# Patient Record
Sex: Male | Born: 1955
Health system: Southern US, Community
[De-identification: ages and names within clinical notes are randomized; demographics above are authoritative.]

## PROBLEM LIST (undated history)

## (undated) DIAGNOSIS — I10 Essential (primary) hypertension: Secondary | ICD-10-CM

## (undated) DIAGNOSIS — J189 Pneumonia, unspecified organism: Secondary | ICD-10-CM

## (undated) DIAGNOSIS — R7303 Prediabetes: Secondary | ICD-10-CM

## (undated) DIAGNOSIS — E291 Testicular hypofunction: Secondary | ICD-10-CM

## (undated) DIAGNOSIS — K219 Gastro-esophageal reflux disease without esophagitis: Secondary | ICD-10-CM

## (undated) DIAGNOSIS — B009 Herpesviral infection, unspecified: Secondary | ICD-10-CM

## (undated) DIAGNOSIS — E669 Obesity, unspecified: Secondary | ICD-10-CM

## (undated) DIAGNOSIS — M109 Gout, unspecified: Secondary | ICD-10-CM

## (undated) DIAGNOSIS — E785 Hyperlipidemia, unspecified: Secondary | ICD-10-CM

## (undated) HISTORY — DX: Hyperlipidemia, unspecified: E78.5

## (undated) HISTORY — DX: Testicular hypofunction: E29.1

## (undated) HISTORY — DX: Prediabetes: R73.03

## (undated) HISTORY — DX: Obesity, unspecified: E66.9

## (undated) HISTORY — PX: HERNIA REPAIR: SHX51

## (undated) HISTORY — DX: Gout, unspecified: M10.9

## (undated) HISTORY — DX: Essential (primary) hypertension: I10

## (undated) HISTORY — DX: Herpesviral infection, unspecified: B00.9

---

## 1997-07-27 ENCOUNTER — Ambulatory Visit (HOSPITAL_COMMUNITY): Admission: RE | Admit: 1997-07-27 | Discharge: 1997-07-27 | Payer: Self-pay | Admitting: General Surgery

## 1998-11-27 ENCOUNTER — Emergency Department (HOSPITAL_COMMUNITY): Admission: EM | Admit: 1998-11-27 | Discharge: 1998-11-27 | Payer: Self-pay | Admitting: Emergency Medicine

## 1999-08-08 ENCOUNTER — Ambulatory Visit: Admission: RE | Admit: 1999-08-08 | Discharge: 1999-08-08 | Payer: Self-pay | Admitting: Internal Medicine

## 2003-05-07 ENCOUNTER — Ambulatory Visit (HOSPITAL_COMMUNITY): Admission: RE | Admit: 2003-05-07 | Discharge: 2003-05-07 | Payer: Self-pay | Admitting: Internal Medicine

## 2004-11-19 IMAGING — CR DG CHEST 2V
2 series · 2 of 2 positions shown · non-contrast
Comparison: none

CLINICAL DATA: Hypertension with cough.
 PA AND LATERAL CHEST:
 Heart size is mildly enlarged with left ventricular prominence.  The mediastinal configuration is otherwise intact.  The lung fields demonstrate some mild prominence of pulmonary vascularity.  No signs of congestive failure, interstitial edema, or focal infiltrate are noted.  No pleural effusions are seen.
 Bony structures demonstrate mild degenerative changes of the thoracic spine with anterior vertebral body height loss at several lower thoracic levels.
 IMPRESSION
 Mild cardiomegaly with left ventricular prominence and mild prominence of pulmonary vascularity.

[view not recorded (1 of 2)]
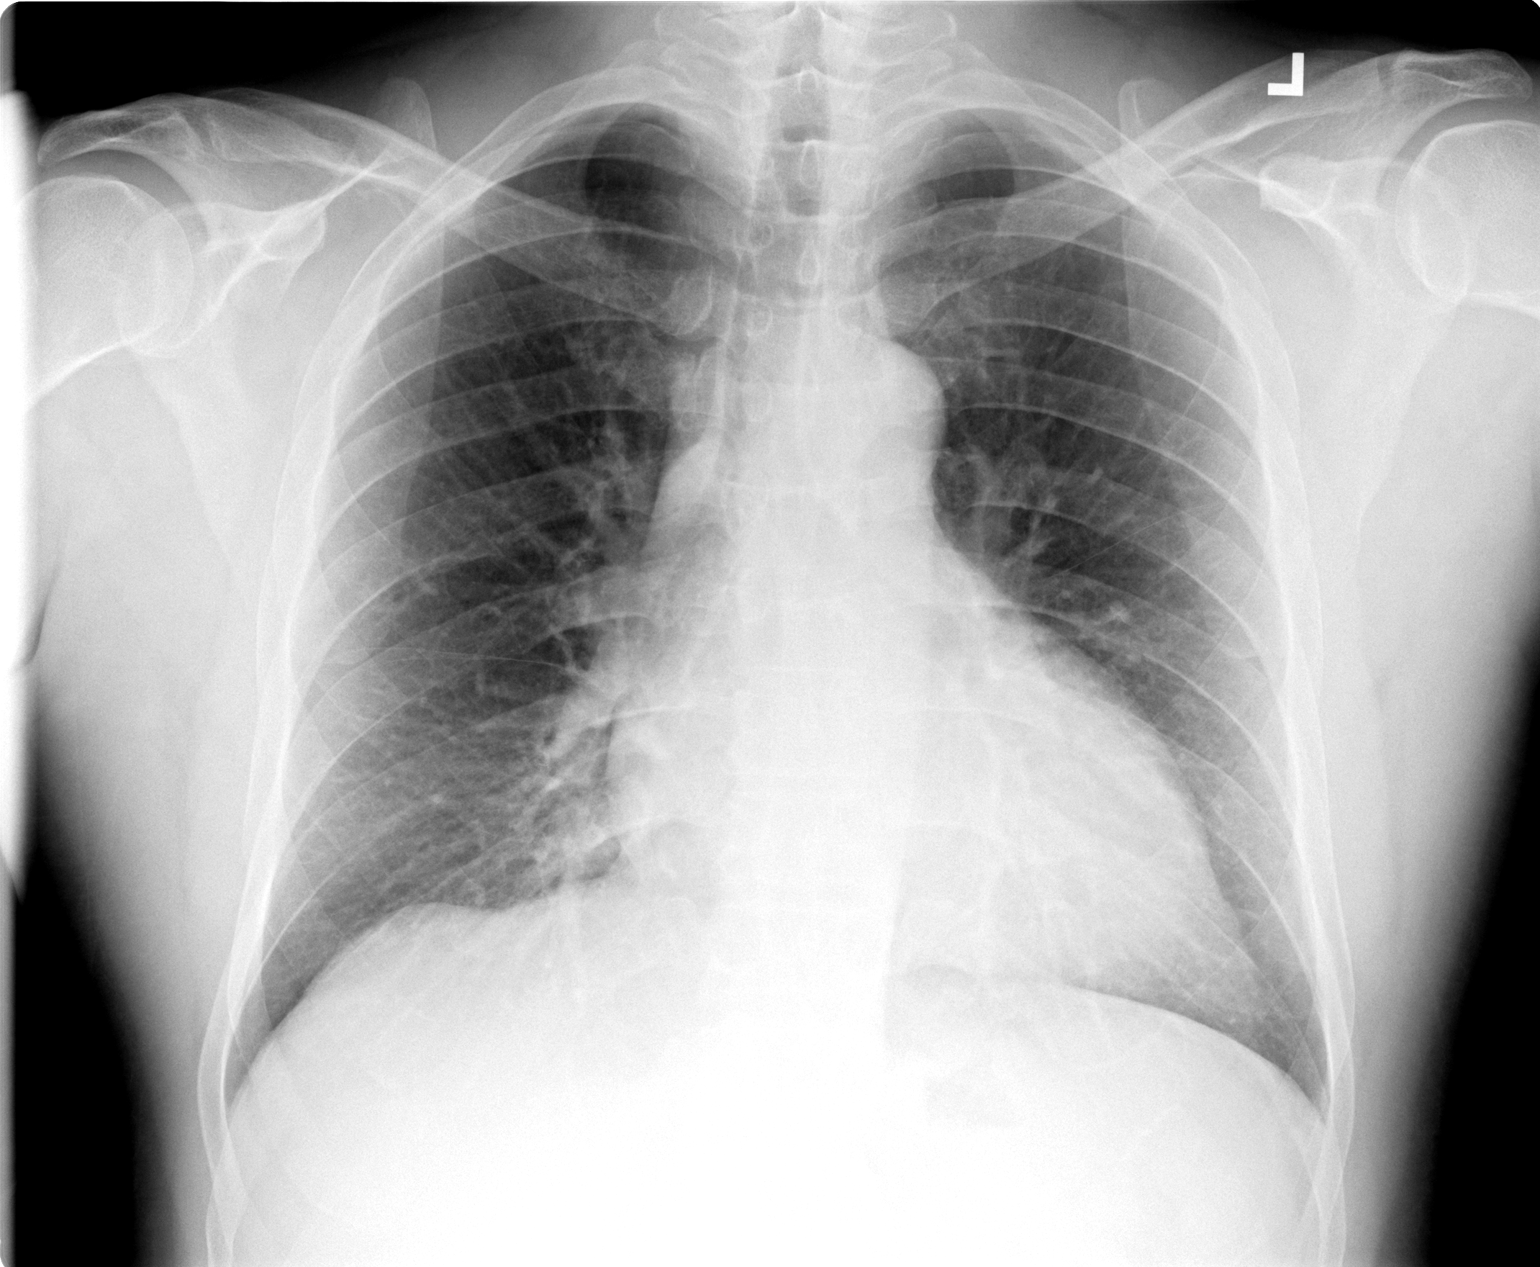

[view not recorded (2 of 2)]
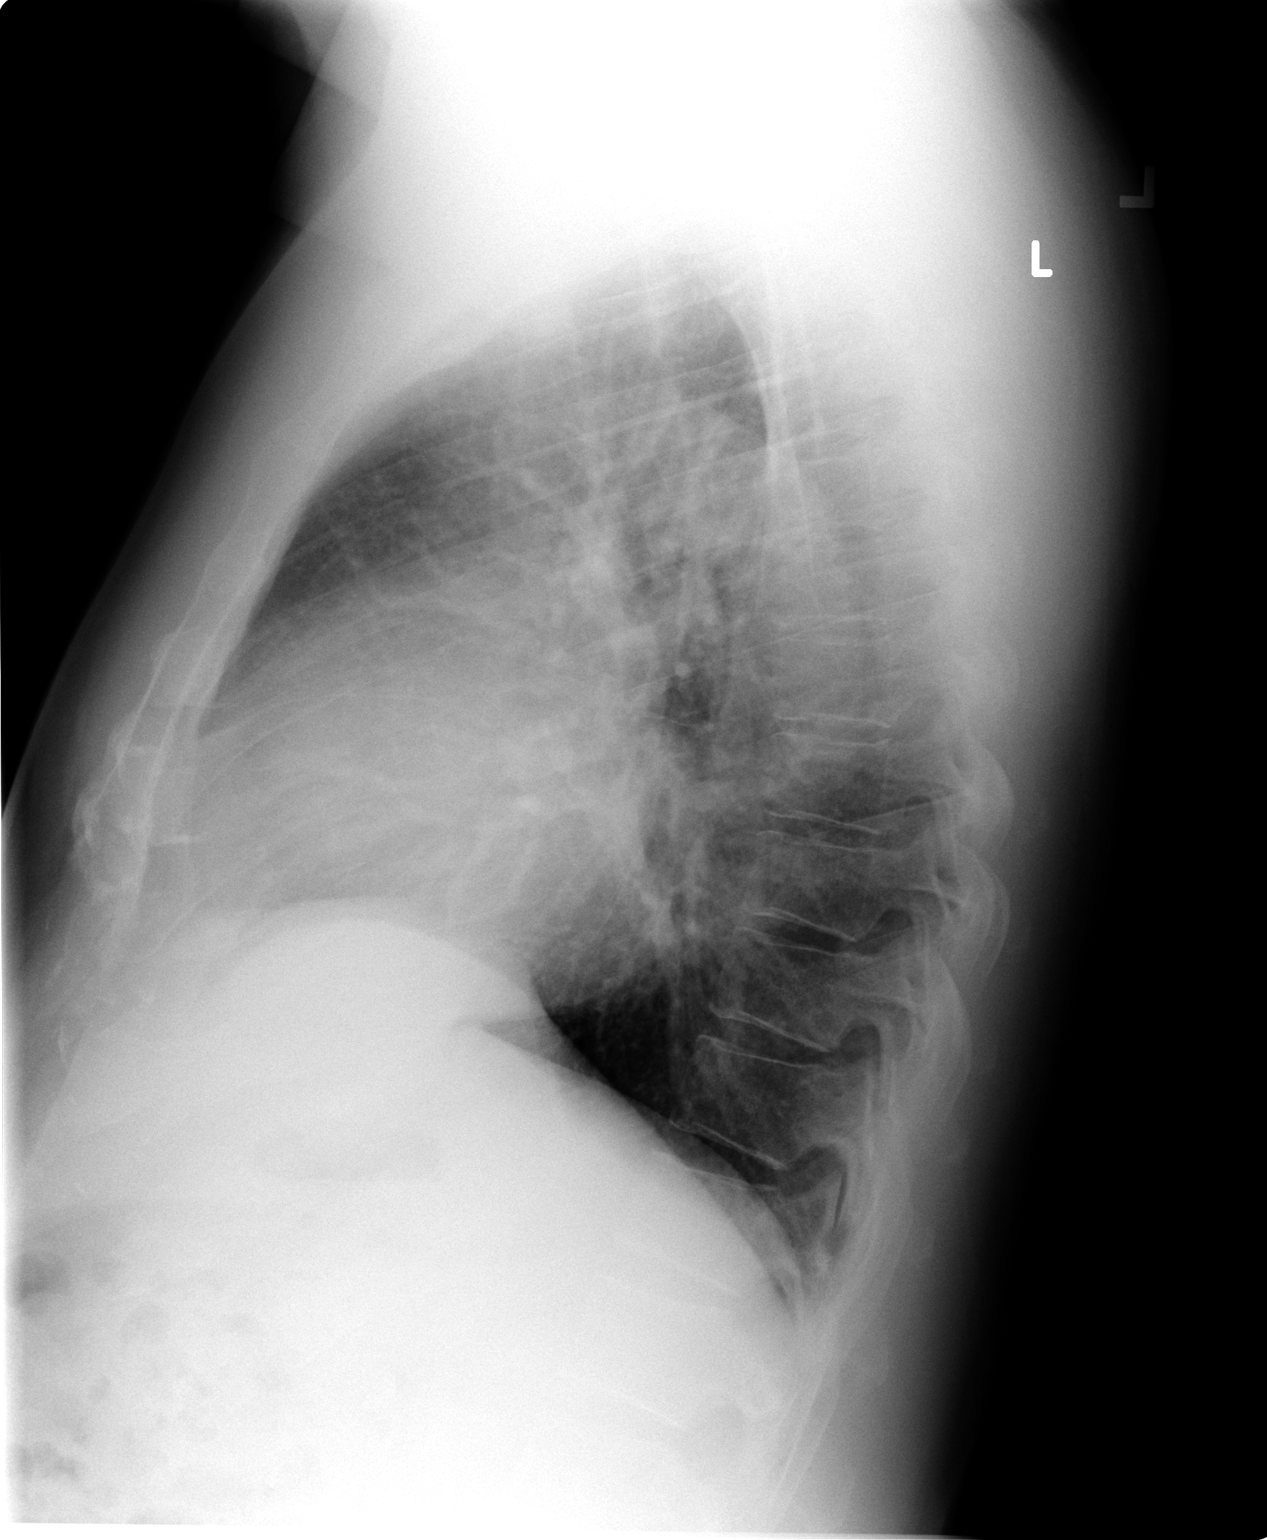

[2 of 2 positions shown; findings below may reference images not displayed]

## 2006-05-19 ENCOUNTER — Encounter (INDEPENDENT_AMBULATORY_CARE_PROVIDER_SITE_OTHER): Payer: Self-pay | Admitting: *Deleted

## 2006-05-19 ENCOUNTER — Ambulatory Visit (HOSPITAL_BASED_OUTPATIENT_CLINIC_OR_DEPARTMENT_OTHER): Admission: RE | Admit: 2006-05-19 | Discharge: 2006-05-19 | Payer: Self-pay | Admitting: *Deleted

## 2006-08-05 ENCOUNTER — Ambulatory Visit: Payer: Self-pay | Admitting: Internal Medicine

## 2006-08-19 ENCOUNTER — Ambulatory Visit: Payer: Self-pay | Admitting: Internal Medicine

## 2007-03-07 ENCOUNTER — Ambulatory Visit: Payer: Self-pay | Admitting: Internal Medicine

## 2009-05-15 ENCOUNTER — Encounter: Payer: Self-pay | Admitting: Internal Medicine

## 2009-05-16 ENCOUNTER — Telehealth: Payer: Self-pay | Admitting: Internal Medicine

## 2009-05-16 DIAGNOSIS — E349 Endocrine disorder, unspecified: Secondary | ICD-10-CM

## 2009-05-16 DIAGNOSIS — K219 Gastro-esophageal reflux disease without esophagitis: Secondary | ICD-10-CM | POA: Insufficient documentation

## 2009-05-16 DIAGNOSIS — I10 Essential (primary) hypertension: Secondary | ICD-10-CM | POA: Insufficient documentation

## 2009-05-16 DIAGNOSIS — E782 Mixed hyperlipidemia: Secondary | ICD-10-CM | POA: Insufficient documentation

## 2009-05-17 ENCOUNTER — Ambulatory Visit: Payer: Self-pay | Admitting: Internal Medicine

## 2009-08-08 ENCOUNTER — Ambulatory Visit: Payer: Self-pay | Admitting: Internal Medicine

## 2009-09-06 ENCOUNTER — Encounter: Payer: Self-pay | Admitting: Internal Medicine

## 2010-02-23 HISTORY — PX: BACK SURGERY: SHX140

## 2010-03-14 ENCOUNTER — Encounter: Payer: Self-pay | Admitting: Internal Medicine

## 2010-03-25 NOTE — Assessment & Plan Note (Signed)
Summary: RECTAL BLEEDING/POSITIVE HEMOCCULTS          DEBORAH    History of Present Illness Visit Type: consult  Primary GI MD: Lina Sar MD Primary Provider: Marinus Maw, MD Requesting Provider: Marinus Maw, MD  Chief Complaint: Heme positive stools and rectal bleeding  History of Present Illness:   This is a  55 year old, African American male with intermittent painless rectal bleeding. He was evaluated in June 2008 with a colonoscopy which showed Grade 1  internal hemorrhoids. He responded to Anusol [facility] suppositories and was well for over a year. He has recently developed recurrent rectal bleeding. He works at the [employer]. There is no family history of colon cancer. He denies constipation.   GI Review of Systems      Denies abdominal pain, acid reflux, belching, bloating, chest pain, dysphagia with liquids, dysphagia with solids, heartburn, loss of appetite, nausea, vomiting, vomiting blood, weight loss, and  weight gain.      Reports heme positive stool and  rectal bleeding.     Denies anal fissure, black tarry stools, change in bowel habit, constipation, diarrhea, diverticulosis, fecal incontinence, hemorrhoids, irritable bowel syndrome, jaundice, light color stool, liver problems, and  rectal pain.    Current Medications (verified): 1)  Atenolol 100 Mg Tabs (Atenolol) .... Take 1 Tablet By Mouth Once A Day 2)  Hydrochlorothiazide 25 Mg Tabs (Hydrochlorothiazide) .... Take 1 Tablet By Mouth Once A Day 3)  Pravachol 40 Mg Tabs (Pravastatin Sodium) .... Take 1 Tablet By Mouth Once A Day 4)  Multivitamins  Tabs (Multiple Vitamin) .... Take 1 Tablet By Mouth Once A Day 5)  Zantac 150 Maximum Strength 150 Mg Tabs (Ranitidine Hcl) .... One Tablet By Mouth Once Daily  Allergies (verified): 1)  ! Ace Inhibitors  Past History:  Past Medical History: Reviewed history from 05/16/2009 and no changes required. Current Problems:  RECTAL BLEEDING  (ICD-569.3) ESOPHAGITIS, HX OF (ICD-V12.79) HYPOGONADISM (ICD-257.2) GERD (ICD-530.81) INTERNAL HEMORRHOIDS (ICD-455.0) HYPERTENSION (ICD-401.9) HYPERLIPIDEMIA (ICD-272.4)    Past Surgical History: Reviewed history from 05/16/2009 and no changes required. Circumcision -1990 I & D of rectal abscess-10/1998 Excision of Large Epidermoid Cyst from Upper Back  Family History: Reviewed history from 05/16/2009 and no changes required. Family History of Cirrhosis:Father died w/alcoholic cirrhosis Family History of Diabetes: Mother No FH of Colon Cancer:  Social History: Occupation: [employer]  Divorced  Childern Patient is a former smoker.  Alcohol Use - no Daily Caffeine Use: Soda daily  Illicit Drug Use - no Smoking Status:  quit  Review of Systems  The patient denies allergy/sinus, anemia, anxiety-new, arthritis/joint pain, back pain, blood in urine, breast changes/lumps, change in vision, confusion, cough, coughing up blood, depression-new, fainting, fatigue, fever, headaches-new, hearing problems, heart murmur, heart rhythm changes, itching, muscle pains/cramps, night sweats, nosebleeds, shortness of breath, skin rash, sleeping problems, sore throat, swelling of feet/legs, swollen lymph glands, thirst - excessive, urination - excessive, urination changes/pain, urine leakage, vision changes, and voice change.         .  Vital Signs:  Patient profile:   55 year old male Height:      69 inches Weight:      199 pounds BMI:     29.49 BSA:     2.06 Pulse rate:   72 / minute Pulse rhythm:   regular BP sitting:   132 / 80  (left arm) Cuff size:   regular  Vitals Entered By: Ok Anis CMA (May 17, 2009 1:42 PM)  Physical Exam  General:  Well developed, well nourished, no acute distress. Eyes:  PERRLA, no icterus. Mouth:  No deformity or lesions, dentition normal. Lungs:  Clear throughout to auscultation. Heart:  Regular rate and rhythm; no murmurs, rubs,  or bruits. Abdomen:   Soft, nontender and nondistended. No masses, hepatosplenomegaly or hernias noted. Normal bowel sounds. Rectal:  perianal area normal, rectal tone was normal. There were first-grade hemorrhoids internally; one of them bleeding actively. There was no thrombosis. There was no evidence of proctitis. There was no stool in the rectum. Extremities:  No clubbing, cyanosis, edema or deformities noted. Skin:  Intact without significant lesions or rashes. Psych:  Alert and cooperative. Normal mood and affect.   Impression & Recommendations:  Problem # 1:  RECTAL BLEEDING (ICD-569.3) Patient has first grade internal hemorrhoids, one of them is  bleeding. We discussed conservative treatment with Anusol-HC suppositories which patient prefers. I have suggested that in the future he may need hemorrhoidal banding. He would prefer the conservative route. I suggested stool softeners and high fiber diet. He will be due for a repeat colonoscopy in 2018 unless his condition changes.  Problem # 2:  ESOPHAGITIS, HX OF (ICD-V12.79) Patient currently has no symptoms.  Patient Instructions: 1)  Please use your anusol suppositories as directed. We have sent refills to your pharmacy to pick up when you need them. 2)  Copy sent to : Lucky Cowboy, MD 3)  The medication list was reviewed and reconciled.  All changed / newly prescribed medications were explained.  A complete medication list was provided to the patient / caregiver. Prescriptions: ANUSOL-HC 25 MG SUPP (HYDROCORTISONE ACETATE) Insert into rectum at bedtime as directed  #30 x 4   Entered by:   Hortense Ramal CMA (AAMA)   Authorized by:   Hart Carwin MD   Signed by:   Hortense Ramal CMA (AAMA) on 05/17/2009   Method used:   Electronically to        [facility] [phone]* (retail)       [address]       [place], [place]  [address]       Ph: [phone]       Fax: [phone]   RxID:   [id number]

## 2010-03-25 NOTE — Assessment & Plan Note (Signed)
Summary: blood in stool--ch.    History of Present Illness Visit Type: Follow-up Visit Primary GI MD: Lina Sar MD Primary Provider: Marinus Maw, MD Requesting Provider: na Chief Complaint: Blood in stool with BMs History of Present Illness:   This is a  55 year old, African American male with intermittent painless rectal bleeding. He was evaluated in June 2008 with a colonoscopy which showed Grade 1 internal hemorrhoids. He responded to Anusol [facility] suppositories and was well for over a year. He recently developed recurrent rectal bleeding at which time he was evaluated by our office in March 2011. He works at the [employer]. There is no family history of colon cancer. He denies constipation. At the time of his last visit with [place], we suggested that in the future, he may need hemorrhoidal banding. He preferred the conservative route and has been trying Anusol suppositories as well as stool softeners and a high fiber diet. Unfortunately, his bleeding has continued.   GI Review of Systems      Denies abdominal pain, acid reflux, belching, bloating, chest pain, dysphagia with liquids, dysphagia with solids, heartburn, loss of appetite, nausea, vomiting, vomiting blood, weight loss, and  weight gain.      Reports rectal bleeding.     Denies anal fissure, black tarry stools, change in bowel habit, constipation, diarrhea, diverticulosis, fecal incontinence, heme positive stool, hemorrhoids, irritable bowel syndrome, jaundice, light color stool, liver problems, and  rectal pain.    Current Medications (verified): 1)  Atenolol 100 Mg Tabs (Atenolol) .... Take 1 Tablet By Mouth Once A Day 2)  Hydrochlorothiazide 25 Mg Tabs (Hydrochlorothiazide) .... Take 1 Tablet By Mouth Once A Day 3)  Pravachol 40 Mg Tabs (Pravastatin Sodium) .... Take 1 Tablet By Mouth Once A Day 4)  Multivitamins  Tabs (Multiple Vitamin) .... Take 1 Tablet By Mouth Once A Day 5)  Zantac 150 Maximum  Strength 150 Mg Tabs (Ranitidine Hcl) .... One Tablet By Mouth Once Daily 6)  Anusol-Hc 25 Mg Supp (Hydrocortisone Acetate) .... Insert Into Rectum At Bedtime As Directed  Allergies (verified): 1)  ! Ace Inhibitors  Past History:  Past Medical History: Reviewed history from 05/16/2009 and no changes required. Current Problems:  RECTAL BLEEDING (ICD-569.3) ESOPHAGITIS, HX OF (ICD-V12.79) HYPOGONADISM (ICD-257.2) GERD (ICD-530.81) INTERNAL HEMORRHOIDS (ICD-455.0) HYPERTENSION (ICD-401.9) HYPERLIPIDEMIA (ICD-272.4)    Past Surgical History: Reviewed history from 05/16/2009 and no changes required. Circumcision -1990 I & D of rectal abscess-10/1998 Excision of Large Epidermoid Cyst from Upper Back  Family History: Reviewed history from 05/16/2009 and no changes required. Family History of Cirrhosis:Father died w/alcoholic cirrhosis Family History of Diabetes: Mother No FH of Colon Cancer:  Social History: Reviewed history from 05/17/2009 and no changes required. Occupation: [employer]  Divorced  Childern Patient is a former smoker.  Alcohol Use - no Daily Caffeine Use: Soda daily  Illicit Drug Use - no  Review of Systems  The patient denies allergy/sinus, anemia, anxiety-new, arthritis/joint pain, back pain, blood in urine, breast changes/lumps, change in vision, confusion, cough, coughing up blood, depression-new, fainting, fatigue, fever, headaches-new, hearing problems, heart murmur, heart rhythm changes, itching, menstrual pain, muscle pains/cramps, night sweats, nosebleeds, pregnancy symptoms, shortness of breath, skin rash, sleeping problems, sore throat, swelling of feet/legs, swollen lymph glands, thirst - excessive , urination - excessive , urination changes/pain, urine leakage, vision changes, and voice change.         Pertinent positive and negative review of systems were noted in the above HPI.  All other ROS was otherwise negative.   Vital Signs:  Patient profile:    55 year old male Height:      69 inches Weight:      209 pounds BMI:     30.98 BSA:     2.11 Pulse rate:   68 / minute Pulse rhythm:   regular BP sitting:   136 / 82  (left arm) Cuff size:   regular  Vitals Entered By: Ok Anis CMA (August 08, 2009 9:20 AM)  Physical Exam  General:  Well developed, well nourished, no acute distress. Neck:  Supple; no masses or thyromegaly. Lungs:  Clear throughout to auscultation. Heart:  Regular rate and rhythm; no murmurs, rubs,  or bruits. Abdomen:  Soft, nontender and nondistended. No masses, hepatosplenomegaly or hernias noted. Normal bowel sounds. Rectal:  normal perianal area. Normal rectal tone. First grade internal hemorrhoids bleeding. Stool is Hemoccult positive. There is no prolapse of the hemorrhoids. There is no thrombosis or at least of the internal hemorrhoids which are bleeding. Extremities:  No clubbing, cyanosis, edema or deformities noted. Skin:  Intact without significant lesions or rashes. Psych:  Alert and cooperative. Normal mood and affect.   Impression & Recommendations:  Problem # 1:  RECTAL BLEEDING (ICD-569.3)  Patient has first grade internal hemorrhoids with recurrent bleeding. First episode occured  3 years ago. Although he responds to conservative treatment, the bleeding almost always comes back. Patient is ready to have a definitive treatment. We discussed hemorrhoidal banding and he would prefer surgical evaluation. He is up-to-date on his colonoscopy which was done in 2008.  Orders: [facility] ([facility])  Problem # 2:  GERD (ICD-530.81) controlled on Zantac 150 mg once a day.  Patient Instructions: 1)  Anusol-HC suppositories one q.h.s. 2)  Stool softeners and high-fiber diet. 3)  Appointment at [facility] surgery for hemorrhoidal banding. 4)  Recall colonoscopy 2018. 5)  Copy sent to : Dr Rosalita Levan, Dr Judie Petit.Tsuei 6)  The medication list was reviewed and reconciled.  All changed / newly  prescribed medications were explained.  A complete medication list was provided to the patient / caregiver. Prescriptions: ANUSOL-HC 25 MG SUPP (HYDROCORTISONE ACETATE) Insert into rectum at bedtime as directed  #30 x 1   Entered by:   Lamona Curl CMA (AAMA)   Authorized by:   Hart Carwin MD   Signed by:   Lamona Curl CMA (AAMA) on 08/08/2009   Method used:   Electronically to        [facility] [phone]* (retail)       [address]       [place], [place]  [address]       Ph: [phone]       Fax: [phone]   RxID:   [id number]

## 2010-03-25 NOTE — Letter (Signed)
Summary: [facility]  [address] [place], [place] [address]   Phone: [phone]  Fax: [phone]            September 06, 2009 MRN: [id number]    Caleb Johns [address] [place], [place]  [address]    Dear [name],   Unfortunately, we have been unable to reach you by phone concerning the visit that you cancelled with [facility] for a consult regarding possible banding of your hemorrhoids. We ask that you please call our office at [phone] to update [place] on your intentions regarding this appointment. We would like to know if you are doing better and if you plan on rescheduling the consultation appointment at any time. We will update our records according to your wishes. Thank you for your attention to this matter. We look forward to continuing in your health care needs.    Thank you,    Hedwig Morton. Juanda Chance, MD [facility]

## 2010-03-25 NOTE — Procedures (Signed)
Summary: COLON   Colonoscopy  Procedure date:  08/19/2006  Findings:      Location:  [facility].    Procedures Next Due Date:    Colonoscopy: 08/2016 Patient Name: Caleb Johns, Caleb Johns MRN:  Procedure Procedures: Colonoscopy CPT: 16109.  Personnel: Endoscopist: Maycie Luera L. Juanda Chance, MD.  Referred By: Marinus Maw, MD.  Exam Location: Exam performed in Outpatient Clinic. Outpatient  Patient Consent: Procedure, Alternatives, Risks and Benefits discussed, consent obtained, from patient. Consent was obtained by the RN.  Indications  Average Risk Screening Routine.  History  Current Medications: Patient is not currently taking Coumadin.  Pre-Exam Physical: Performed Aug 19, 2006. Entire physical exam was normal.  Comments: Pt. history reviewed/updated, physical exam performed prior to initiation of sedation?yes Exam Exam: Extent of exam reached: Cecum, extent intended: Cecum.  The cecum was identified by appendiceal orifice and IC valve. Duration of exam: time in  5:25 min, out 7:18 min minutes. Colon retroflexion performed. Images taken. ASA Classification: I. Tolerance: good.  Monitoring: Pulse and BP monitoring, Oximetry used. Supplemental O2 given.  Colon Prep Used Miralax for colon prep. Prep results: good.  Sedation Meds: Patient assessed and found to be appropriate for moderate (conscious) sedation. Fentanyl 75 mcg. given IV. Versed 10 mg. given IV.  Findings - NORMAL EXAM: Cecum.  - NORMAL EXAM: Rectum.   Assessment Normal examination.  Comments: no polyps Events  Unplanned Interventions: No intervention was required.  Unplanned Events: There were no complications. Plans Patient Education: Patient given standard instructions for: Patient instructed to get routine colonoscopy every 10 years.  Disposition: After procedure patient sent to recovery. After recovery patient sent home.   This report was created from the original endoscopy  report, which was reviewed and signed by the above listed endoscopist.

## 2010-03-25 NOTE — Progress Notes (Signed)
Summary: TRIAGE-URGENT APPT.   Phone Note From Other Clinic Call back at [phone]   Caller: Aram Beecham from Dr Oneta Rack office Call For: Juanda Chance Reason for Call: Schedule Patient Appt Summary of Call: Dr Edman Circle Would like this patient seen before first available appt 5-3 do to rectal bleeding and positive for blood after rectal exam. Initial call taken by: Tawni Levy,  May 16, 2009 9:36 AM  Follow-up for Phone Call        Pt. will see Dr.Jessika Rothery on 05-17-09 at 2pm. Aram Beecham will advise pt. of appt/med.list/co-pay/cx.policy and she will fax records to [place] at [phone]. Follow-up by: Laureen Ochs LPN,  May 16, 2009 9:47 AM

## 2010-04-22 NOTE — Consult Note (Signed)
Summary: [facility]   Imported By: Sherian Rein 04/14/2010 09:51:48  _____________________________________________________________________  External Attachment:    Type:   Image     Comment:   External Document

## 2010-07-08 NOTE — Assessment & Plan Note (Signed)
[facility]                         GASTROENTEROLOGY OFFICE NOTE   MANI, CELESTIN                         MRN:          [id number]  DATE:03/07/2007                            DOB:          26-Aug-1955    Mr. Keetch is a 55 year old gentleman who has had intermittent pain with  rectal with bleeding.  We saw him in June of 2008 at Dr. Kathryne Sharper  request for routine colonoscopy for neoplastic screening.  He had a  completely normal exam.  The colonoscopic, which was retroflexed in the  rectum at that time, did not show any evidence or any presence of  hemorrhoids.  He since then has had occasional bright red blood per  rectum, and most recently almost on a daily basis.  He seems to strain a  lot due to constipation.  He blames constipation on vitamin D as well as  hydrochlorothiazide and atenolol which he takes for high blood pressure.  There is no family history of colon cancer.   MEDICATIONS:  1. Hydrochlorothiazide 25 mg p.o. daily.  2. Atenolol 100 mg p.o. daily.  3. Pravastatin 20 mg p.o. daily.  4. Multiple vitamins.  5. Vitamin D.   PAST HISTORY:  Significant for high blood pressure, hyperlipidemia,  arthritic complaints, back surgery last year, sinus problems.   FAMILY HISTORY:  Positive for diabetes in mother.   SOCIAL HISTORY:  Married with 5 children.  The patient stopped smoking  and drinking, 1989.   REVIEW OF SYSTEMS:  Wears glasses, allergies, arthritic complaints.   PHYSICAL EXAMINATION:  Blood pressure 140/80, pulse 62 and weight 207  pounds.  The patient was alert and oriented in no distress.  LUNGS:  Clear to auscultation.  COR:  Normal S1 and normal S2.  ABDOMEN:  Soft, nontender.  RECTAL EXAM/ENDOSCOPIC EXAM:  Reveals normal perianal area.  Rectal tone  was normal.  There were large 1st grade hemorrhoids, at least 2 of them  were somewhat hyperemic and swollen.  Stool was Hemoccult negative.  One  of the hemorrhoids was  actually leaking a small amount of bright red  blood.   IMPRESSION:  A 55 year old African-American male with 1st grade internal  hemorrhoids, now symptomatic with bleeding.  The hemorrhoids were not  noted on last colonoscopy in June 2008.  The exam was essentially  normal.   PLAN:  Treat the hemorrhoids initially conservatively with  suppositories, dietary modifications, and stool softeners, and if he  does not respond over a period of time, I have suggested that these  hemorrhoids could be banded.  He will use Anusol 2 suppositories  nightly, 3 weeks were given, Colace 100 mg a day or 3 times per week for  constipation.  Drink a lot of liquids, stay on a high-fiber diet, no  need for repeat colonoscopy at this time.     Hedwig Morton. Juanda Chance, MD  Electronically Signed    DMB/MedQ  DD: 03/07/2007  DT: 03/07/2007  Job #: [id number]   cc:   Lucky Cowboy, M.D.

## 2010-07-11 NOTE — Op Note (Signed)
NAME:  Caleb Johns, Caleb Johns NO.:  [id number]   MEDICAL RECORD NO.:  [id number]          PATIENT TYPE:  AMB   LOCATION:  [facility]                         FACILITY:  [facility]   PHYSICIAN:  Alfonse Ras, MD   DATE OF BIRTH:  04/28/1955   DATE OF PROCEDURE:  05/19/2006  DATE OF DISCHARGE:                               OPERATIVE REPORT   PREOPERATIVE DIAGNOSIS:  Right upper back mass.   POSTOPERATIVE DIAGNOSIS:  Infected epidermoid cyst.   OPERATION/PROCEDURE:  Excision of epidermoid cyst and drainage of  abscess and open packing.   SURGEON:  Alfonse Ras, M.D.   ANESTHESIA:  General.   DESCRIPTION OF PROCEDURE:  The patient was taken to the operating room,  placed in the supine position.  Laryngeal mask was placed and general  anesthesia was induced.  The patient was placed in the left lateral  decubitus position.  Right upper back was prepped and draped in the  normal sterile fashion.  Using a vertically oriented incision, I  dissected down through the skin onto a encapsulated mass which had  significant amount of pus within it.  This was drained.  The entire  capsule was excised in its entirety.  Adequate hemostasis was ensured  with the use of Bovie electrocautery and Surgicel.  It was packed with  saline-soaked gauze.  Sterile dressing was applied.  The patient  tolerated procedure well and went to with PACU in good condition.      Alfonse Ras, MD  Electronically Signed     KRE/MEDQ  D:  05/19/2006  T:  05/19/2006  Job:  [id number]

## 2010-10-04 ENCOUNTER — Inpatient Hospital Stay (INDEPENDENT_AMBULATORY_CARE_PROVIDER_SITE_OTHER)
Admission: RE | Admit: 2010-10-04 | Discharge: 2010-10-04 | Disposition: A | Discharge status: Home / Self Care | Payer: BC Managed Care – PPO | Source: Ambulatory Visit | Attending: Family Medicine | Admitting: Family Medicine

## 2010-10-04 DIAGNOSIS — S335XXA Sprain of ligaments of lumbar spine, initial encounter: Secondary | ICD-10-CM

## 2010-10-04 DIAGNOSIS — M436 Torticollis: Secondary | ICD-10-CM

## 2010-10-04 DIAGNOSIS — M75100 Unspecified rotator cuff tear or rupture of unspecified shoulder, not specified as traumatic: Secondary | ICD-10-CM

## 2012-08-23 ENCOUNTER — Ambulatory Visit (HOSPITAL_COMMUNITY): Payer: BC Managed Care – PPO | Attending: Cardiology | Admitting: Radiology

## 2012-08-23 VITALS — BP 124/76 | HR 52 | Ht 69.0 in | Wt 203.0 lb

## 2012-08-23 DIAGNOSIS — R0609 Other forms of dyspnea: Secondary | ICD-10-CM | POA: Insufficient documentation

## 2012-08-23 DIAGNOSIS — R0789 Other chest pain: Secondary | ICD-10-CM | POA: Insufficient documentation

## 2012-08-23 DIAGNOSIS — I10 Essential (primary) hypertension: Secondary | ICD-10-CM | POA: Insufficient documentation

## 2012-08-23 DIAGNOSIS — R0989 Other specified symptoms and signs involving the circulatory and respiratory systems: Secondary | ICD-10-CM | POA: Insufficient documentation

## 2012-08-23 DIAGNOSIS — R079 Chest pain, unspecified: Secondary | ICD-10-CM

## 2012-08-23 DIAGNOSIS — Z87891 Personal history of nicotine dependence: Secondary | ICD-10-CM | POA: Insufficient documentation

## 2012-08-23 DIAGNOSIS — E119 Type 2 diabetes mellitus without complications: Secondary | ICD-10-CM | POA: Insufficient documentation

## 2012-08-23 DIAGNOSIS — E785 Hyperlipidemia, unspecified: Secondary | ICD-10-CM | POA: Insufficient documentation

## 2012-08-23 DIAGNOSIS — R0602 Shortness of breath: Secondary | ICD-10-CM

## 2012-08-23 MED ORDER — TECHNETIUM TC 99M SESTAMIBI GENERIC - CARDIOLITE
11.0000 | Freq: Once | INTRAVENOUS | Status: AC | PRN
  Administered 2012-08-23: 11 via INTRAVENOUS

## 2012-08-23 MED ORDER — TECHNETIUM TC 99M SESTAMIBI GENERIC - CARDIOLITE
33.0000 | Freq: Once | INTRAVENOUS | Status: AC | PRN
  Administered 2012-08-23: 33 via INTRAVENOUS

## 2012-08-23 NOTE — Progress Notes (Signed)
[facility] SITE 3 NUCLEAR MED [address] [place], [place] [address] [phone]    Cardiology Nuclear Med Study  Caleb Johns is a 57 y.o. male     MRN : [id number]     DOB: [birth year]  Procedure Date: 08/23/2012  Nuclear Med Background Indication for Stress Test:  Evaluation for Ischemia History:  No prior known history of CAD Cardiac Risk Factors: History of Smoking, Hypertension, Lipids and NIDDM  Symptoms: Chest Pain/Pressure without exertion (last occurrence 2 weeks ago),  DOE   Nuclear Pre-Procedure Caffeine/Decaff Intake:  None> 12 hrs NPO After: 9:00pm   Lungs:  clear O2 Sat: 98% on room air. IV 0.9% NS with Angio Cath:  20g  IV Site: R Antecubital x 1, tolerated well IV Started by:  Irean Hong, RN  Chest Size (in):  42 Cup Size: n/a  Height: 5\' 9"  (1.753 m)  Weight:  203 lb (92.08 kg)  BMI:  Body mass index is 29.96 kg/(m^2). Tech Comments:  Held Atenolol x 36 hrs    Nuclear Med Study 1 or 2 day study: 1 day  Stress Test Type:  Stress  Reading MD: Olga Millers, MD  Order Authorizing Provider:  Lucky Cowboy, MD  Resting Radionuclide: Technetium 22m Sestamibi  Resting Radionuclide Dose: 11.0 mCi   Stress Radionuclide:  Technetium 68m Sestamibi  Stress Radionuclide Dose: 33.0 mCi           Stress Protocol Rest HR: 52 Stress HR: 146  Rest BP: 124/76 Stress BP: 204/91  Exercise Time (min): 10:30 METS: 12.5   Predicted Max HR: 164 bpm % Max HR: 89.02 bpm Rate Pressure Product: 21308   Dose of Adenosine (mg):  n/a Dose of Lexiscan: n/a mg  Dose of Atropine (mg): n/a Dose of Dobutamine: n/a mcg/kg/min (at max HR)  Stress Test Technologist: Irean Hong, RN  Nuclear Technologist:  Domenic Polite, CNMT     Rest Procedure:  Myocardial perfusion imaging was performed at rest 45 minutes following the intravenous administration of Technetium 63m Sestamibi. Rest ECG: Sinus bradycardia, cannot R/O septal MI.  Stress Procedure:  The patient  exercised on the treadmill utilizing the Bruce Protocol for 10:30 minutes, RPE= 17. The patient stopped due to Fatigue and denied any chest pain. There was a hypertensive response to exercise. Technetium 70m Sestamibi was injected at peak exercise and myocardial perfusion imaging was performed after a brief delay. Stress ECG: Insignificant upsloping ST segment depression.  QPS Raw Data Images:  Acquisition technically good; normal left ventricular size. Stress Images:  Normal homogeneous uptake in all areas of the myocardium. Rest Images:  Normal homogeneous uptake in all areas of the myocardium. Subtraction (SDS):  No evidence of ischemia. Transient Ischemic Dilatation (Normal <1.22):  n/a Lung/Heart Ratio (Normal <0.45):  0.39  Quantitative Gated Spect Images QGS EDV:  118 ml QGS ESV:  47 ml  Impression Exercise Capacity:  Good exercise capacity. BP Response:  Hypertensive blood pressure response. Clinical Symptoms:  There is dyspnea. ECG Impression:  Insignificant upsloping ST segment depression. Comparison with Prior Nuclear Study: No previous nuclear study performed  Overall Impression:  Normal stress nuclear study.  LV Ejection Fraction: 61%.  LV Wall Motion:  NL LV Function; NL Wall Motion  Olga Millers

## 2012-09-14 ENCOUNTER — Encounter: Payer: BC Managed Care – PPO | Admitting: Physician Assistant

## 2013-01-05 ENCOUNTER — Telehealth: Payer: Self-pay | Admitting: Internal Medicine

## 2013-01-05 MED ORDER — RANITIDINE HCL 300 MG PO TABS: 300.0000 mg | ORAL_TABLET | Freq: Every day | ORAL | Status: DC

## 2013-01-05 MED ORDER — HYDROCHLOROTHIAZIDE 25 MG PO TABS: 25.0000 mg | ORAL_TABLET | Freq: Every day | ORAL | Status: DC

## 2013-01-05 MED ORDER — ALLOPURINOL 300 MG PO TABS: 300.0000 mg | ORAL_TABLET | Freq: Every day | ORAL | Status: DC

## 2013-01-05 NOTE — Telephone Encounter (Signed)
Pt needs said pharmacy needs renewal on gout med, indegestion med, and blood pressure hctz at [facility] [place]

## 2013-01-26 ENCOUNTER — Encounter: Payer: Self-pay | Admitting: Internal Medicine

## 2013-02-02 ENCOUNTER — Encounter: Payer: Self-pay | Admitting: Internal Medicine

## 2013-02-02 ENCOUNTER — Ambulatory Visit (INDEPENDENT_AMBULATORY_CARE_PROVIDER_SITE_OTHER): Payer: BC Managed Care – PPO | Admitting: Internal Medicine

## 2013-02-02 VITALS — BP 108/80 | HR 64 | Temp 99.0°F | Resp 18 | Ht 68.5 in | Wt 218.2 lb

## 2013-02-02 DIAGNOSIS — Z113 Encounter for screening for infections with a predominantly sexual mode of transmission: Secondary | ICD-10-CM

## 2013-02-02 DIAGNOSIS — I10 Essential (primary) hypertension: Secondary | ICD-10-CM

## 2013-02-02 DIAGNOSIS — Z79899 Other long term (current) drug therapy: Secondary | ICD-10-CM

## 2013-02-02 DIAGNOSIS — R7402 Elevation of levels of lactic acid dehydrogenase (LDH): Secondary | ICD-10-CM

## 2013-02-02 DIAGNOSIS — E559 Vitamin D deficiency, unspecified: Secondary | ICD-10-CM

## 2013-02-02 DIAGNOSIS — R7309 Other abnormal glucose: Secondary | ICD-10-CM

## 2013-02-02 DIAGNOSIS — Z125 Encounter for screening for malignant neoplasm of prostate: Secondary | ICD-10-CM

## 2013-02-02 DIAGNOSIS — Z111 Encounter for screening for respiratory tuberculosis: Secondary | ICD-10-CM

## 2013-02-02 DIAGNOSIS — Z0000 Encounter for general adult medical examination without abnormal findings: Secondary | ICD-10-CM

## 2013-02-02 DIAGNOSIS — Z1212 Encounter for screening for malignant neoplasm of rectum: Secondary | ICD-10-CM

## 2013-02-02 DIAGNOSIS — R7401 Elevation of levels of liver transaminase levels: Secondary | ICD-10-CM

## 2013-02-02 LAB — HEPATIC FUNCTION PANEL
ALT: 15 U/L (ref 0–53)
Alkaline Phosphatase: 84 U/L (ref 39–117)
Bilirubin, Direct: 0.1 mg/dL (ref 0.0–0.3)
Indirect Bilirubin: 0.5 mg/dL (ref 0.0–0.9)
Total Protein: 6.5 g/dL (ref 6.0–8.3)

## 2013-02-02 LAB — CBC WITH DIFFERENTIAL/PLATELET
HCT: 43.7 % (ref 39.0–52.0)
Hemoglobin: 15.1 g/dL (ref 13.0–17.0)
Lymphocytes Relative: 51 % — ABNORMAL HIGH (ref 12–46)
Monocytes Absolute: 0.6 10*3/uL (ref 0.1–1.0)
Monocytes Relative: 11 % (ref 3–12)
Neutro Abs: 1.7 10*3/uL (ref 1.7–7.7)
Neutrophils Relative %: 32 % — ABNORMAL LOW (ref 43–77)
RBC: 4.95 MIL/uL (ref 4.22–5.81)
WBC: 5.2 10*3/uL (ref 4.0–10.5)

## 2013-02-02 LAB — LIPID PANEL
Cholesterol: 152 mg/dL (ref 0–200)
LDL Cholesterol: 69 mg/dL (ref 0–99)
Total CHOL/HDL Ratio: 4.6 Ratio
Triglycerides: 250 mg/dL — ABNORMAL HIGH (ref <150)
VLDL: 50 mg/dL — ABNORMAL HIGH (ref 0–40)

## 2013-02-02 LAB — BASIC METABOLIC PANEL WITH GFR
BUN: 15 mg/dL (ref 6–23)
Chloride: 103 mEq/L (ref 96–112)
GFR, Est Non African American: 84 mL/min
Glucose, Bld: 91 mg/dL (ref 70–99)
Potassium: 3.6 mEq/L (ref 3.5–5.3)
Sodium: 138 mEq/L (ref 135–145)

## 2013-02-02 LAB — HEMOGLOBIN A1C: Mean Plasma Glucose: 123 mg/dL — ABNORMAL HIGH (ref <117)

## 2013-02-02 NOTE — Progress Notes (Signed)
Patient ID: Caleb Johns, male   DOB: [birth year], 56 y.o.   MRN: [id number]  Annual Screening Comprehensive Examination  This very nice 57 yo SBM  presents for complete physical.  Patient has been followed for HTN (2004), Prediabetes (2009), Hyperlipidemia, and Vitamin D Deficiency.   Patient's BP has been controlled at home. Patient denies any cardiac symptoms as chest pain, palpitations, shortness of breath, dizziness or ankle swelling.   Patient's hyperlipidemia is controlled with diet and medications. Patient denies myalgias or other medication SE's. Last cholesterol last visit was 144, HDL 31 and LDL 60 - at goal with triglycerides 263 elevated.    Patient has prediabetes/insulin resistance with last A1c 6.0% in September. Patient denies reactive hypoglycemic symptoms, visual blurring, diabetic polys, or paresthesias. Patient is obese with BMI of 32.    Finally, patient has history of Vitamin D Deficiency with last vitamin D      Current Outpatient Prescriptions on File Prior to Visit  Medication Sig Dispense Refill  . allopurinol (ZYLOPRIM) 300 MG tablet Take 1 tablet (300 mg total) by mouth daily.  90 tablet  1  . hydrochlorothiazide (HYDRODIURIL) 25 MG tablet Take 1 tablet (25 mg total) by mouth daily.  90 tablet  1   Vitamin D 5000 u daily      Magnesium 250 mg daily      Pravastatin 40 mg     . ranitidine (ZANTAC) 300 MG tablet Take 1 tablet (300 mg total) by mouth at bedtime.  90 tablet  1   No current facility-administered medications on file prior to visit.    Allergies  Allergen Reactions  . Ace Inhibitors     REACTION: cough  . Trilipix [Choline Fenofibrate] Other (See Comments)    Heart burn    Past Medical History  Diagnosis Date  . Hypertension   . Hyperlipidemia   . Obese   . Pre-diabetes   . Hypogonadism male   . Gout   . HSV-1 (herpes simplex virus 1) infection   . HSV-2 (herpes simplex virus 2) infection     No past surgical history on  file.  Family History  Problem Relation Age of Onset  . Diabetes Mother   . Hypertension Mother   . Hypertension Brother   . Hypertension Brother   . Hypertension Brother     History   Social History  . Marital Status: Single    Spouse Name: N/A    Number of Children: N/A  . Years of Education: N/A   Occupational History  . Not on file.   Social History Main Topics  . Smoking status: Former Smoker    Quit date: 02/23/2002  . Smokeless tobacco: Not on file  . Alcohol Use: No  . Drug Use: No  . Sexual Activity: Not on file   Other Topics Concern  . Not on file   Social History Narrative  . No narrative on file    ROS Constitutional: Denies fever, chills, weight loss/gain, headaches, insomnia, fatigue, night sweats, and change in appetite. Eyes: Denies redness, blurred vision, diplopia, discharge, itchy, watery eyes.  ENT: Denies discharge, congestion, post nasal drip, epistaxis, sore throat, earache, hearing loss, dental pain, Tinnitus, Vertigo, Sinus pain, snoring.  Cardio: Denies chest pain, palpitations, irregular heartbeat, syncope, dyspnea, diaphoresis, orthopnea, PND, claudication, edema Respiratory: denies cough, dyspnea, DOE, pleurisy, hoarseness, laryngitis, wheezing.  Gastrointestinal: Denies dysphagia, heartburn, reflux, water brash, pain, cramps, nausea, vomiting, bloating, diarrhea, constipation, hematemesis, melena, hematochezia, jaundice, hemorrhoids Genitourinary: Denies  dysuria, frequency, urgency, nocturia, hesitancy, discharge, hematuria, flank pain Musculoskeletal: Denies arthralgia, myalgia, stiffness, Jt. Swelling, pain, limp, and strain/sprain. Skin: Denies puritis, rash, hives, warts, acne, eczema, changing in skin lesion Neuro: No weakness, tremor, incoordination, spasms, paresthesia, pain Psychiatric: Denies confusion, memory loss, sensory loss Endocrine: Denies change in weight, skin, hair change, nocturia, and paresthesia, diabetic polys,  visual blurring, hyper / hypo glycemic episodes.  Heme/Lymph: No excessive bleeding, bruising, or elarged lymph nodes.  Filed Vitals:   02/02/13 1715  BP: 108/80  Pulse: 64  Temp: 99 F (37.2 C)  Resp: 18    Estimated body mass index is 32.69 kg/(m^2) as calculated from the following:   Height as of this encounter: 5' 8.5" (1.74 m).   Weight as of this encounter: 218 lb 3.2 oz (98.975 kg).  Physical Exam General Appearance: Well nourished, in no apparent distress. Eyes: PERRLA, EOMs, conjunctiva no swelling or erythema, normal fundi and vessels. Sinuses: No frontal/maxillary tenderness ENT/Mouth: EACs patent / TMs  nl. Nares clear without erythema, swelling, mucoid exudates. Oral hygiene is good. No erythema, swelling, or exudate. Tongue normal, non-obstructing. Tonsils not swollen or erythematous. Hearing normal.  Neck: Supple, thyroid normal. No bruits, nodes or JVD. Respiratory: Respiratory effort normal.  BS equal and clear bilateral without rales, rhonci, wheezing or stridor. Cardio: Heart sounds are normal with regular rate and rhythm and no murmurs, rubs or gallops. Peripheral pulses are normal and equal bilaterally without edema. No aortic or femoral bruits. Chest: symmetric with normal excursions and percussion.  Abdomen: Flat, soft, with bowl sounds. Nontender, no guarding, rebound, hernias, masses, or organomegaly.  Lymphatics: Non tender without lymphadenopathy.  Genitourinary: No hernias.Testes nl. DRE - prostate nl for age - smooth & firm w/o nodules. Musculoskeletal: Full ROM all peripheral extremities, joint stability, 5/5 strength, and normal gait. Skin: Warm and dry without rashes, lesions, cyanosis, clubbing or  ecchymosis.  Neuro: Cranial nerves intact, reflexes equal bilaterally. Normal muscle tone, no cerebellar symptoms. Sensation intact.  Pysch: Awake and oriented X 3, normal affect, insight and judgment appropriate.   Assessment and Plan  1. Annual  Screening Examination 2. Hypertension  3. Hyperlipidemia 4. Pre Diabetes 5. Vitamin D Deficiency  Continue prudent diet as discussed, weight control, BP monitoring, regular exercise, and medications as discussed.  Discussed med effects and SE's. Routine screening labs and tests as requested with regular follow-up as recommended.

## 2013-02-02 NOTE — Patient Instructions (Signed)

## 2013-02-03 LAB — VITAMIN B12: Vitamin B-12: 694 pg/mL (ref 211–911)

## 2013-02-03 LAB — PSA: PSA: 1.82 ng/mL (ref <=4.00)

## 2013-02-03 LAB — MICROALBUMIN / CREATININE URINE RATIO
Creatinine, Urine: 57.6 mg/dL
Microalb Creat Ratio: 8.7 mg/g (ref 0.0–30.0)
Microalb, Ur: 0.5 mg/dL (ref 0.00–1.89)

## 2013-02-03 LAB — HIV ANTIBODY (ROUTINE TESTING W REFLEX): HIV: NONREACTIVE

## 2013-02-03 LAB — URINALYSIS, MICROSCOPIC ONLY: Casts: NONE SEEN

## 2013-03-06 ENCOUNTER — Other Ambulatory Visit: Payer: Self-pay | Admitting: Internal Medicine

## 2013-03-06 MED ORDER — ATENOLOL 100 MG PO TABS: 100.0000 mg | ORAL_TABLET | Freq: Every day | ORAL | Status: DC

## 2013-05-03 ENCOUNTER — Other Ambulatory Visit: Payer: Self-pay | Admitting: Internal Medicine

## 2013-05-03 DIAGNOSIS — N529 Male erectile dysfunction, unspecified: Secondary | ICD-10-CM

## 2013-05-03 MED ORDER — SILDENAFIL CITRATE 100 MG PO TABS: ORAL_TABLET | ORAL | Status: DC

## 2013-05-11 ENCOUNTER — Ambulatory Visit (INDEPENDENT_AMBULATORY_CARE_PROVIDER_SITE_OTHER): Payer: BC Managed Care – PPO | Admitting: Physician Assistant

## 2013-05-11 ENCOUNTER — Encounter: Payer: Self-pay | Admitting: Physician Assistant

## 2013-05-11 VITALS — BP 128/80 | HR 60 | Temp 97.7°F | Resp 16 | Ht 69.0 in | Wt 215.0 lb

## 2013-05-11 DIAGNOSIS — E291 Testicular hypofunction: Secondary | ICD-10-CM

## 2013-05-11 DIAGNOSIS — Z79899 Other long term (current) drug therapy: Secondary | ICD-10-CM

## 2013-05-11 DIAGNOSIS — E785 Hyperlipidemia, unspecified: Secondary | ICD-10-CM

## 2013-05-11 DIAGNOSIS — I10 Essential (primary) hypertension: Secondary | ICD-10-CM

## 2013-05-11 DIAGNOSIS — R7309 Other abnormal glucose: Secondary | ICD-10-CM

## 2013-05-11 DIAGNOSIS — E559 Vitamin D deficiency, unspecified: Secondary | ICD-10-CM

## 2013-05-11 LAB — CBC WITH DIFFERENTIAL/PLATELET
BASOS ABS: 0 10*3/uL (ref 0.0–0.1)
BASOS PCT: 0 % (ref 0–1)
Eosinophils Absolute: 0.3 10*3/uL (ref 0.0–0.7)
Eosinophils Relative: 5 % (ref 0–5)
HCT: 43.6 % (ref 39.0–52.0)
HEMOGLOBIN: 14.9 g/dL (ref 13.0–17.0)
Lymphocytes Relative: 52 % — ABNORMAL HIGH (ref 12–46)
Lymphs Abs: 2.6 10*3/uL (ref 0.7–4.0)
MCH: 30 pg (ref 26.0–34.0)
MCHC: 34.2 g/dL (ref 30.0–36.0)
MCV: 87.9 fL (ref 78.0–100.0)
Monocytes Absolute: 0.5 10*3/uL (ref 0.1–1.0)
Monocytes Relative: 10 % (ref 3–12)
NEUTROS ABS: 1.7 10*3/uL (ref 1.7–7.7)
NEUTROS PCT: 33 % — AB (ref 43–77)
Platelets: 154 10*3/uL (ref 150–400)
RBC: 4.96 MIL/uL (ref 4.22–5.81)
RDW: 13.8 % (ref 11.5–15.5)
WBC: 5 10*3/uL (ref 4.0–10.5)

## 2013-05-11 LAB — HEMOGLOBIN A1C
Hgb A1c MFr Bld: 5.8 % — ABNORMAL HIGH (ref <5.7)
Mean Plasma Glucose: 120 mg/dL — ABNORMAL HIGH (ref <117)

## 2013-05-11 MED ORDER — PREDNISOLONE ACETATE 0.12 % OP SUSP: 1.0000 [drp] | Freq: Four times a day (QID) | OPHTHALMIC | Status: DC

## 2013-05-11 MED ORDER — RANITIDINE HCL 300 MG PO TABS: 300.0000 mg | ORAL_TABLET | Freq: Two times a day (BID) | ORAL | Status: DC

## 2013-05-11 NOTE — Patient Instructions (Signed)
   Bad carbs also include fruit juice, alcohol, and sweet tea. These are empty calories that do not signal to your brain that you are full.   Please remember the good carbs are still carbs which convert into sugar. So please measure them out no more than 1/2-1 cup of rice, oatmeal, pasta, and beans.  Veggies are however free foods! Pile them on.   I like lean protein at every meal such as chicken, turkey, pork chops, cottage cheese, etc. Just do not fry these meats and please center your meal around vegetable, the meats should be a side dish.   No all fruit is created equal. Please see the list below, the fruit at the bottom is higher in sugars than the fruit at the top   Remember exercise is great for your cardiovascular health and can help with weight loss but YOU CAN NOT OUT RUN YOUR FORK!     

## 2013-05-11 NOTE — Progress Notes (Signed)
HPI 58 y.o. male  presents for 3 month follow up with hypertension, hyperlipidemia, prediabetes and vitamin D. His blood pressure has been controlled at home, today their BP is BP: 128/80 mmHg He does workout, when the weather is nicer he walks. He denies chest pain, shortness of breath, dizziness.  He is on cholesterol medication and denies myalgias. His cholesterol is at goal. The cholesterol last visit was:   Lab Results  Component Value Date   CHOL 152 02/02/2013   HDL 33* 02/02/2013   LDLCALC 69 02/02/2013   TRIG 250* 02/02/2013   CHOLHDL 4.6 02/02/2013   He has been working on diet and exercise for prediabetes, he has been eating salads and denies hyperglycemia, polydipsia, polyuria and visual disturbances. Last A1C in the office was:  Lab Results  Component Value Date   HGBA1C 5.9* 02/02/2013   Patient is on Vitamin D supplement.  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   Obesity- goal weight is 190.   Current Medications:  Current Outpatient Prescriptions on File Prior to Visit  Medication Sig Dispense Refill  . allopurinol (ZYLOPRIM) 300 MG tablet Take 1 tablet (300 mg total) by mouth daily.  90 tablet  1  . atenolol (TENORMIN) 100 MG tablet Take 1 tablet (100 mg total) by mouth daily.  90 tablet  1  . hydrochlorothiazide (HYDRODIURIL) 25 MG tablet Take 1 tablet (25 mg total) by mouth daily.  90 tablet  1  . Magnesium 250 MG TABS Take 250 mg by mouth. daily      . pravastatin (PRAVACHOL) 40 MG tablet Take 40 mg by mouth daily. Takes 1/2 tab mon wed fri      . ranitidine (ZANTAC) 300 MG tablet Take 1 tablet (300 mg total) by mouth at bedtime.  90 tablet  1  . sildenafil (VIAGRA) 100 MG tablet 1/2 to 1 tab daily as needed for XXXX  20 tablet  99  . vitamin B-12 (CYANOCOBALAMIN) 500 MCG tablet Take 500 mcg by mouth daily.      . Vitamin D, Cholecalciferol, 1000 UNITS TABS Take  1,000 Units by mouth. Takes 5000 units daily       No current facility-administered medications on file prior to visit.   Medical History:  Past Medical History  Diagnosis Date  . Hypertension   . Hyperlipidemia   . Obese   . Pre-diabetes   . Hypogonadism male   . Gout   . HSV-1 (herpes simplex virus 1) infection   . HSV-2 (herpes simplex virus 2) infection    Allergies:  Allergies  Allergen Reactions  . Ace Inhibitors     REACTION: cough  . Trilipix [Choline Fenofibrate] Other (See Comments)    Heart burn     Review of Systems: [X]  = complains of  [ ]  = denies  General: Fatigue [ ]  Fever [ ]  Chills [ ]  Weakness [ ]   Insomnia [ ]  Eyes: Redness [ ]  Blurred vision [ ]  Diplopia [ ]   ENT: Congestion [ ]  Sinus Pain [ ]  Post Nasal Drip [ ]  Sore Throat [ ]  Earache [ ]   Cardiac: Chest pain/pressure [ ]  SOB [ ]  Orthopnea [ ]   Palpitations [ ]   Paroxysmal nocturnal dyspnea[ ]  Claudication [ ]  Edema [ ]   Pulmonary: Cough [ ]  Wheezing[ ]   SOB [ ]   Snoring [ ]   GI: Nausea [ ]   Vomiting[ ]  Dysphagia[ ]  Heartburn[ ]  Abdominal pain [ ]  Constipation [ ] ; Diarrhea [ ] ; BRBPR [ ]  Melena[ ]  GU: Hematuria[ ]  Dysuria [ ]  Nocturia[ ]  Urgency [ ]   Hesitancy [ ]  Discharge [ ]  Neuro: Headaches[ ]  Vertigo[ ]  Paresthesias[ ]  Spasm [ ]  Speech changes [ ]  Incoordination [ ]   Ortho: Arthritis [ ]  Joint pain [ ]  Muscle pain [ ]  Joint swelling [ ]  Back Pain [ ]  Skin:  Rash [ ]   Pruritis [ ]  Change in skin lesion [ ]   Psych: Depression[ ]  Anxiety[ ]  Confusion [ ]  Memory loss [ ]   Heme/Lypmh: Bleeding [ ]  Bruising [ ]  Enlarged lymph nodes [ ]   Endocrine: Visual blurring [ ]  Paresthesia [ ]  Polyuria [ ]  Polydypsea [ ]    Heat/cold intolerance [ ]  Hypoglycemia [ ]   Family history- Review and unchanged Social history- Review and unchanged Physical Exam: BP 128/80  Pulse 60  Temp(Src) 97.7 F (36.5 C)  Resp 16  Ht 5\' 9"  (1.753 m)  Wt 215 lb (97.523 kg)  BMI 31.74 kg/m2 Wt Readings from Last 3 Encounters:   05/11/13 215 lb (97.523 kg)  02/02/13 218 lb 3.2 oz (98.975 kg)  08/23/12 203 lb (92.08 kg)   General Appearance: Well nourished, in no apparent distress. Eyes: PERRLA, EOMs, conjunctiva no swelling or erythema Sinuses: No Frontal/maxillary tenderness ENT/Mouth: Ext aud canals clear, TMs without erythema, bulging. No erythema, swelling, or exudate on post pharynx.  Tonsils not swollen or erythematous. Hearing normal.  Neck: Supple, thyroid normal.  Respiratory: Respiratory effort normal, BS equal bilaterally without rales, rhonchi, wheezing or stridor.  Cardio: RRR with no MRGs. Brisk peripheral pulses without edema.  Abdomen: Soft, + BS.  Non tender, no guarding, rebound, hernias, masses. Lymphatics: Non tender without lymphadenopathy.  Musculoskeletal: Full ROM, 5/5 strength, normal gait.  Skin: Warm, dry without rashes, lesions, ecchymosis.  Neuro: Cranial nerves intact. Normal muscle tone, no cerebellar symptoms. Sensation intact.  Psych: Awake and oriented X 3, normal affect, Insight and Judgment appropriate.   Assessment and Plan:  Hypertension: Continue medication, monitor blood pressure at home. Continue DASH diet. Cholesterol: Continue diet and exercise. Check cholesterol.  Pre-diabetes-Continue diet and exercise. Check A1C Vitamin D Def- check level and continue medications.  Obesity with co morbidities- long discussion about weight loss, diet, and exercise    Continue diet and meds as discussed. Further disposition pending results of labs.  Vicie Mutters 4:32 PM

## 2013-05-12 LAB — BASIC METABOLIC PANEL WITH GFR
BUN: 10 mg/dL (ref 6–23)
CO2: 29 mEq/L (ref 19–32)
Calcium: 9.2 mg/dL (ref 8.4–10.5)
Chloride: 101 mEq/L (ref 96–112)
Creat: 0.89 mg/dL (ref 0.50–1.35)
Glucose, Bld: 102 mg/dL — ABNORMAL HIGH (ref 70–99)
POTASSIUM: 3.6 meq/L (ref 3.5–5.3)
SODIUM: 139 meq/L (ref 135–145)

## 2013-05-12 LAB — LIPID PANEL
Cholesterol: 112 mg/dL (ref 0–200)
HDL: 29 mg/dL — AB (ref >39)
LDL CALC: 48 mg/dL (ref 0–99)
TRIGLYCERIDES: 175 mg/dL — AB (ref <150)
Total CHOL/HDL Ratio: 3.9 Ratio
VLDL: 35 mg/dL (ref 0–40)

## 2013-05-12 LAB — HEPATIC FUNCTION PANEL
ALT: 17 U/L (ref 0–53)
AST: 33 U/L (ref 0–37)
Albumin: 4.3 g/dL (ref 3.5–5.2)
Alkaline Phosphatase: 80 U/L (ref 39–117)
BILIRUBIN DIRECT: 0.1 mg/dL (ref 0.0–0.3)
BILIRUBIN INDIRECT: 0.4 mg/dL (ref 0.2–1.2)
TOTAL PROTEIN: 6.5 g/dL (ref 6.0–8.3)
Total Bilirubin: 0.5 mg/dL (ref 0.2–1.2)

## 2013-05-12 LAB — MAGNESIUM: Magnesium: 1.6 mg/dL (ref 1.5–2.5)

## 2013-05-12 LAB — INSULIN, FASTING: INSULIN FASTING, SERUM: 20 u[IU]/mL (ref 3–28)

## 2013-05-12 LAB — TSH: TSH: 0.587 u[IU]/mL (ref 0.350–4.500)

## 2013-05-15 ENCOUNTER — Other Ambulatory Visit: Payer: Self-pay | Admitting: Physician Assistant

## 2013-05-15 DIAGNOSIS — N529 Male erectile dysfunction, unspecified: Secondary | ICD-10-CM

## 2013-05-15 MED ORDER — SILDENAFIL CITRATE 100 MG PO TABS: ORAL_TABLET | ORAL | Status: DC

## 2013-06-05 ENCOUNTER — Telehealth: Payer: Self-pay | Admitting: *Deleted

## 2013-06-05 MED ORDER — GLUCOSE BLOOD VI STRP: ORAL_STRIP | Status: DC

## 2013-06-05 NOTE — Telephone Encounter (Signed)
NEEDS NEW RX : for freestyle test strips

## 2013-06-22 ENCOUNTER — Telehealth: Payer: Self-pay

## 2013-06-22 NOTE — Telephone Encounter (Signed)
Pt has hernia, c/o increased pain w/ work. Per Dr.McKeown, pt is advised to sched an OV to eval & refer to surgeon. lmom for pt to return my call.

## 2013-06-28 ENCOUNTER — Encounter: Payer: Self-pay | Admitting: Internal Medicine

## 2013-06-28 ENCOUNTER — Ambulatory Visit (INDEPENDENT_AMBULATORY_CARE_PROVIDER_SITE_OTHER): Payer: BC Managed Care – PPO | Admitting: Internal Medicine

## 2013-06-28 VITALS — BP 122/78 | HR 60 | Temp 97.9°F | Resp 16 | Ht 69.0 in | Wt 201.2 lb

## 2013-06-28 DIAGNOSIS — K4090 Unilateral inguinal hernia, without obstruction or gangrene, not specified as recurrent: Secondary | ICD-10-CM

## 2013-06-28 DIAGNOSIS — Z79899 Other long term (current) drug therapy: Secondary | ICD-10-CM

## 2013-06-28 DIAGNOSIS — E291 Testicular hypofunction: Secondary | ICD-10-CM

## 2013-06-28 DIAGNOSIS — A6000 Herpesviral infection of urogenital system, unspecified: Secondary | ICD-10-CM

## 2013-06-28 DIAGNOSIS — E782 Mixed hyperlipidemia: Secondary | ICD-10-CM

## 2013-06-28 DIAGNOSIS — K219 Gastro-esophageal reflux disease without esophagitis: Secondary | ICD-10-CM

## 2013-06-28 DIAGNOSIS — I10 Essential (primary) hypertension: Secondary | ICD-10-CM

## 2013-06-28 MED ORDER — ACYCLOVIR 800 MG PO TABS: ORAL_TABLET | ORAL | Status: DC

## 2013-06-28 NOTE — Patient Instructions (Signed)
Inguinal Hernia, Adult Muscles help keep everything in the body in its proper place. But if a weak spot in the muscles develops, something can poke through. That is called a hernia. When this happens in the lower part of the belly (abdomen), it is called an inguinal hernia. (It takes its name from a part of the body in this region called the inguinal canal.) A weak spot in the wall of muscles lets some fat or part of the small intestine bulge through. An inguinal hernia can develop at any age. Men get them more often than women. CAUSES  In adults, an inguinal hernia develops over time.  It can be triggered by:  Suddenly straining the muscles of the lower abdomen.  Lifting heavy objects.  Straining to have a bowel movement. Difficult bowel movements (constipation) can lead to this.  Constant coughing. This may be caused by smoking or lung disease.  Being overweight.  Being pregnant.  Working at a job that requires long periods of standing or heavy lifting.  Having had an inguinal hernia before. One type can be an emergency situation. It is called a strangulated inguinal hernia. It develops if part of the small intestine slips through the weak spot and cannot get back into the abdomen. The blood supply can be cut off. If that happens, part of the intestine may die. This situation requires emergency surgery. SYMPTOMS  Often, a small inguinal hernia has no symptoms. It is found when a healthcare provider does a physical exam. Larger hernias usually have symptoms.   In adults, symptoms may include:  A lump in the groin. This is easier to see when the person is standing. It might disappear when lying down.  In men, a lump in the scrotum.  Pain or burning in the groin. This occurs especially when lifting, straining or coughing.  A dull ache or feeling of pressure in the groin.  Signs of a strangulated hernia can include:  A bulge in the groin that becomes very painful and tender to the  touch.  A bulge that turns red or purple.  Fever, nausea and vomiting.  Inability to have a bowel movement or to pass gas. DIAGNOSIS  To decide if you have an inguinal hernia, a healthcare provider will probably do a physical examination.  This will include asking questions about any symptoms you have noticed.  The healthcare provider might feel the groin area and ask you to cough. If an inguinal hernia is felt, the healthcare provider may try to slide it back into the abdomen.  Usually no other tests are needed. TREATMENT  Treatments can vary. The size of the hernia makes a difference. Options include:  Watchful waiting. This is often suggested if the hernia is small and you have had no symptoms.  No medical procedure will be done unless symptoms develop.  You will need to watch closely for symptoms. If any occur, contact your healthcare provider right away.  Surgery. This is used if the hernia is larger or you have symptoms.  Open surgery. This is usually an outpatient procedure (you will not stay overnight in a hospital). An cut (incision) is made through the skin in the groin. The hernia is put back inside the abdomen. The weak area in the muscles is then repaired by herniorrhaphy or hernioplasty. Herniorrhaphy: in this type of surgery, the weak muscles are sewn back together. Hernioplasty: a patch or mesh is used to close the weak area in the abdominal wall.  Laparoscopy.   In this procedure, a surgeon makes small incisions. A thin tube with a tiny video camera (called a laparoscope) is put into the abdomen. The surgeon repairs the hernia with mesh by looking with the video camera and using two long instruments. HOME CARE INSTRUCTIONS   After surgery to repair an inguinal hernia:  You will need to take pain medicine prescribed by your healthcare provider. Follow all directions carefully.  You will need to take care of the wound from the incision.  Your activity will be  restricted for awhile. This will probably include no heavy lifting for several weeks. You also should not do anything too active for a few weeks. When you can return to work will depend on the type of job that you have.  During "watchful waiting" periods, you should:  Maintain a healthy weight.  Eat a diet high in fiber (fruits, vegetables and whole grains).  Drink plenty of fluids to avoid constipation. This means drinking enough water and other liquids to keep your urine clear or pale yellow.  Do not lift heavy objects.  Do not stand for long periods of time.  Quit smoking. This should keep you from developing a frequent cough. SEEK MEDICAL CARE IF:   A bulge develops in your groin area.  You feel pain, a burning sensation or pressure in the groin. This might be worse if you are lifting or straining.  You develop a fever of more than 100.5 F (38.1 C). SEEK IMMEDIATE MEDICAL CARE IF:   Pain in the groin increases suddenly.  A bulge in the groin gets bigger suddenly and does not go down.  For men, there is sudden pain in the scrotum. Or, the size of the scrotum increases.  A bulge in the groin area becomes red or purple and is painful to touch.  You have nausea or vomiting that does not go away.  You feel your heart beating much faster than normal.  You cannot have a bowel movement or pass gas.  You develop a fever of more than 102.0 F (38.9 C). Document Released: 06/28/2008 Document Revised: 05/04/2011 Document Reviewed: 06/28/2008 [facility] Patient Information 2014 [place], [place].   Herpes Simplex Herpes simplex is generally classified as Type 1 or Type 2. Type 1 is generally the type that is responsible for cold sores. Type 2 is generally associated with sexually transmitted diseases. We now know that most of the thoughts on these viruses are inaccurate. We find that HSV1 is also present genitally and HSV2 can be present orally, but this will vary in different  locations of the world. Herpes simplex is usually detected by doing a culture. Blood tests are also available for this virus; however, the accuracy is often not as good.  PREPARATION FOR TEST No preparation or fasting is necessary. NORMAL FINDINGS  No virus present  No HSV antigens or antibodies present Ranges for normal findings may vary among different laboratories and hospitals. You should always check with your doctor after having lab work or other tests done to discuss the meaning of your test results and whether your values are considered within normal limits. MEANING OF TEST  Your caregiver will go over the test results with you and discuss the importance and meaning of your results, as well as treatment options and the need for additional tests if necessary. OBTAINING THE TEST RESULTS  It is your responsibility to obtain your test results. Ask the lab or department performing the test when and how you will get  your results. Document Released: 03/14/2004 Document Revised: 05/04/2011 Document Reviewed: 01/21/2008 [facility] Patient Information 2014 [place], [place].

## 2013-06-28 NOTE — Progress Notes (Signed)
   Subjective:    Patient ID: Caleb Johns, male    DOB: 06-22-1955, 58 y.o.   MRN: [id number]  HPI Pt c/o RIH recently increasin insize and painful. Also state his Girlfriend is angry with him as she apparently has  Had an outbreak of genital HSV and thinks that he is responsible.  Meds, allergies ,SH and FH reviewed and unchanged.  Review of Systems In addition to the HPI above,  No Fever-chills,  No Headache, No changes with Vision or hearing,  No problems swallowing food or Liquids,  No Chest pain or productive Cough or Shortness of Breath,  No Abdominal pain, No Nausea or Vommitting, Bowel movements are regular,  No Blood in stool or Urine,  No dysuria,  No new skin rashes or bruises,  No new joints pains-aches,  No new weakness, tingling, numbness in any extremity,  No recent weight loss,  No polyuria, polydypsia or polyphagia,  No significant Mental Stressors.  A full 10 point Review of Systems was done, except as stated above, all other Review of Systems were negative  Objective:   Physical Exam  BP 122/78  Pulse 60  Temp(Src) 97.9 F (36.6 C) (Temporal)  Resp 16  Ht 5\' 9"  (1.753 m)  Wt 201 lb 3.2 oz (91.264 kg)  BMI 29.70 kg/m2  Focused exam confirms a tender reducible Right Inguinal Hernia. There is no sign of HSV.  Assessment & Plan:    1. Right Inguinal Hernia  - Ambulatory referral to General Surgery  2. Recurrent genital HSV (herpes simplex virus) infection  - HSV(herpes simplex vrs) titers   - Rx Zovirax 800 mg qd for suppression or prevention

## 2013-06-30 LAB — HSV(HERPES SIMPLEX VRS) I + II AB-IGG
HSV 1 Glycoprotein G Ab, IgG: 10.27 IV — ABNORMAL HIGH
HSV 2 Glycoprotein G Ab, IgG: 8.72 IV — ABNORMAL HIGH

## 2013-07-19 ENCOUNTER — Encounter (INDEPENDENT_AMBULATORY_CARE_PROVIDER_SITE_OTHER): Payer: Self-pay | Admitting: General Surgery

## 2013-07-19 ENCOUNTER — Ambulatory Visit (INDEPENDENT_AMBULATORY_CARE_PROVIDER_SITE_OTHER): Payer: BC Managed Care – PPO | Admitting: General Surgery

## 2013-07-19 VITALS — BP 126/74 | HR 77 | Temp 97.4°F | Ht 68.0 in | Wt 205.0 lb

## 2013-07-19 DIAGNOSIS — K429 Umbilical hernia without obstruction or gangrene: Secondary | ICD-10-CM | POA: Insufficient documentation

## 2013-07-19 DIAGNOSIS — K4090 Unilateral inguinal hernia, without obstruction or gangrene, not specified as recurrent: Secondary | ICD-10-CM | POA: Insufficient documentation

## 2013-07-19 MED ORDER — TAMSULOSIN HCL 0.4 MG PO CAPS: 0.4000 mg | ORAL_CAPSULE | Freq: Every day | ORAL | Status: DC

## 2013-07-19 NOTE — Patient Instructions (Signed)
Start Flomax 5 days before your surgery. Continue to take it after the surgery until it is gone.   [facility] _______[facility]  UMBILICAL OR INGUINAL HERNIA REPAIR: POST OP INSTRUCTIONS  Always review your discharge instruction sheet given to you by the facility where your surgery was performed. IF YOU HAVE DISABILITY OR FAMILY LEAVE FORMS, YOU MUST BRING THEM TO THE OFFICE FOR PROCESSING.   DO NOT GIVE THEM TO YOUR DOCTOR.  1. A  prescription for pain medication may be given to you upon discharge.  Take your pain medication as prescribed, if needed.  If narcotic pain medicine is not needed, then you may take acetaminophen (Tylenol) or ibuprofen (Advil) as needed. 2. Take your usually prescribed medications unless otherwise directed. 3. If you need a refill on your pain medication, please contact your pharmacy.  They will contact our office to request authorization. Prescriptions will not be filled after 5 pm or on week-ends. 4. You should follow a light diet the first 24 hours after arrival home, such as soup and crackers, etc.  Be sure to include lots of fluids daily.  Resume your normal diet the day after surgery. 5. Most patients will experience some swelling and bruising around the umbilicus or in the groin and scrotum.  Ice packs and reclining will help.  Swelling and bruising can take several days to resolve.  6. It is common to experience some constipation if taking pain medication after surgery.  Increasing fluid intake and taking a stool softener (such as Colace) will usually help or prevent this problem from occurring.  A mild laxative (Milk of Magnesia or Miralax) should be taken according to package directions if there are no bowel movements after 48 hours. 7. Unless discharge instructions indicate otherwise, you may remove your bandages 24-48 hours after surgery, and you may shower at that time.  You may have steri-strips (small skin tapes) in place directly over the  incision.  These strips should be left on the skin for 7-10 days.  If your surgeon used skin glue on the incision, you may shower in 24 hours.  The glue will flake off over the next 2-3 weeks.  Any sutures or staples will be removed at the office during your follow-up visit. 8. ACTIVITIES:  You may resume regular (light) daily activities beginning the next day-such as daily self-care, walking, climbing stairs-gradually increasing activities as tolerated.  You may have sexual intercourse when it is comfortable.  Refrain from any heavy lifting or straining-Nothing over 10 pounds for 6 weeks. a. You may drive when you are no longer taking prescription pain medication, you can comfortably wear a seatbelt, and you can safely maneuver your car and apply brakes. b. RETURN TO WORK:  Light work in one to 2 weeks, full duty in 6-8 weeks__________________________________________________________ 50. You should see your doctor in the office for a follow-up appointment approximately 2-3 weeks after your surgery.  Make sure that you call for this appointment within a day or two after you arrive home to insure a convenient appointment time. 10. OTHER INSTRUCTIONS:  __________________________________________________________________________________________________________________________________________________________________________________________  WHEN TO CALL YOUR DOCTOR: 1. Fever over 101.0 2. Inability to urinate 3. Nausea and/or vomiting 4. Extreme swelling or bruising 5. Continued bleeding from incision. 6. Increased pain, redness, or drainage from the incision  The clinic staff is available to answer your questions during regular business hours.  Please don't hesitate to call and ask to speak to one of the nurses for clinical concerns.  If you have a medical emergency, go to the nearest emergency room or call 911.  A surgeon from [facility] is always on call at the hospital   [address], [place], [place], [place]  [address] ?  [address], [place], [place]   [address] [phone] ? [phone] ? FAX [phone] Web site: [email or web address]

## 2013-07-19 NOTE — Progress Notes (Signed)
Patient ID: Caleb Johns, male   DOB: 03/23/1955, 58 y.o.   MRN: [id number]  Chief Complaint  Patient presents with  . eval hernia    HPI Caleb Johns is a 58 y.o. male.   HPI  He is referred by Dr. McKeown For further evaluation and treatment of a symptomatic right inguinal hernia. He also has a slightly symptomatic umbilical hernia.  He does some heavy lifting at work. Sometimes he has to push the hernia back in. He does have some discomfort from the hernia at times. He does have to strain to start his urinary stream. Denies constipation.  Past Medical History  Diagnosis Date  . Hypertension   . Hyperlipidemia   . Obese   . Pre-diabetes   . Hypogonadism male   . Gout   . HSV-1 (herpes simplex virus 1) infection   . HSV-2 (herpes simplex virus 2) infection     History reviewed. No pertinent past surgical history.  Family History  Problem Relation Age of Onset  . Diabetes Mother   . Hypertension Mother   . Hypertension Brother   . Hypertension Brother   . Hypertension Brother     Social History History  Substance Use Topics  . Smoking status: Former Smoker    Quit date: 02/23/2002  . Smokeless tobacco: Not on file  . Alcohol Use: No    Allergies  Allergen Reactions  . Ace Inhibitors     REACTION: cough  . Trilipix [Choline Fenofibrate] Other (See Comments)    Heart burn    Current Outpatient Prescriptions  Medication Sig Dispense Refill  . acyclovir (ZOVIRAX) 800 MG tablet Take 1 tablet daily  30 tablet  1  . allopurinol (ZYLOPRIM) 300 MG tablet Take 1 tablet (300 mg total) by mouth daily.  90 tablet  1  . atenolol (TENORMIN) 100 MG tablet Take 1 tablet (100 mg total) by mouth daily.  90 tablet  1  . glucose blood (FREESTYLE TEST STRIPS) test strip Use as instructed, DX 250.00  100 each  12  . hydrochlorothiazide (HYDRODIURIL) 25 MG tablet Take 1 tablet (25 mg total) by mouth daily.  90 tablet  1  . Magnesium 250 MG TABS Take 250 mg by mouth. daily      .  pravastatin (PRAVACHOL) 40 MG tablet Take 40 mg by mouth daily. Takes 1/2 tab mon wed fri      . ranitidine (ZANTAC) 300 MG tablet Take 1 tablet (300 mg total) by mouth 2 (two) times daily.  270 tablet  1  . sildenafil (VIAGRA) 100 MG tablet 1/2 to 1 tab daily as needed for XXXX  30 tablet  3  . vitamin B-12 (CYANOCOBALAMIN) 500 MCG tablet Take 500 mcg by mouth daily.      . Vitamin D, Cholecalciferol, 1000 UNITS TABS Take 1,000 Units by mouth. Takes 5000 units daily      . tamsulosin (FLOMAX) 0.4 MG CAPS capsule Take 1 capsule (0.4 mg total) by mouth daily.  10 capsule  0   No current facility-administered medications for this visit.    Review of Systems Review of Systems  Constitutional: Negative.   HENT: Negative.   Respiratory: Negative.   Cardiovascular: Negative.   Gastrointestinal: Negative for abdominal pain and constipation.  Genitourinary: Positive for difficulty urinating. Negative for testicular pain.  Hematological: Negative.     Blood pressure 126/74, pulse 77, temperature 97.4 F (36.3 C), height 5' 8" (1.727 m), weight 205 lb (92.987 kg).    Physical Exam Physical Exam  Constitutional: No distress.  Overweight male,  HENT:  Head: Normocephalic and atraumatic.  Eyes: No scleral icterus.  Cardiovascular: Normal rate and regular rhythm.   Pulmonary/Chest: Effort normal and breath sounds normal.  Abdominal: Soft.  Reducible umbilical hernia  Genitourinary:  Reducible right inguinal bulge consistent with hernia, no left inguinal bulge.  No testicular masses  Neurological: He is alert.  Skin: Skin is warm and dry.  Psychiatric: He has a normal mood and affect. His behavior is normal.    Data Reviewed Dr. McKeown's note.  Assessment    Symptomatic right inguinal hernia and umbilical hernia. He is interested in having both repaired. He also has some symptoms of BPH.     Plan    Laparoscopic right inguinal hernia repair with mesh, open umbilical hernia repair  with mesh.  Will also treat him with perioperative Flomax to try to avoid urinary retention.  I have explained the procedure, risks, and aftercare of inguinal and umbilical hernia repair.  Risks include but are not limited to bleeding, infection, wound problems, anesthesia, recurrence, bladder or intestine injury, urinary retention, testicular dysfunction, chronic pain, mesh problems.  He seems to understand and agrees to proceed.        Burak Zerbe J Maryjo Ragon 07/19/2013, 10:08 AM    

## 2013-07-20 ENCOUNTER — Telehealth (INDEPENDENT_AMBULATORY_CARE_PROVIDER_SITE_OTHER): Payer: Self-pay | Admitting: General Surgery

## 2013-07-20 NOTE — Telephone Encounter (Signed)
Noted  

## 2013-07-20 NOTE — Telephone Encounter (Signed)
Patient met with surgery scheduling gave financial responsibilities, patient will call back to schedule.

## 2013-08-03 ENCOUNTER — Other Ambulatory Visit (HOSPITAL_COMMUNITY): Payer: Self-pay | Admitting: Anesthesiology

## 2013-08-03 ENCOUNTER — Ambulatory Visit (INDEPENDENT_AMBULATORY_CARE_PROVIDER_SITE_OTHER): Payer: BC Managed Care – PPO | Admitting: Internal Medicine

## 2013-08-03 ENCOUNTER — Encounter: Payer: Self-pay | Admitting: Internal Medicine

## 2013-08-03 VITALS — BP 112/68 | HR 98 | Temp 97.5°F | Resp 16 | Ht 69.0 in | Wt 206.2 lb

## 2013-08-03 DIAGNOSIS — Z79899 Other long term (current) drug therapy: Secondary | ICD-10-CM

## 2013-08-03 DIAGNOSIS — E559 Vitamin D deficiency, unspecified: Secondary | ICD-10-CM

## 2013-08-03 DIAGNOSIS — I10 Essential (primary) hypertension: Secondary | ICD-10-CM

## 2013-08-03 DIAGNOSIS — E782 Mixed hyperlipidemia: Secondary | ICD-10-CM

## 2013-08-03 DIAGNOSIS — R7309 Other abnormal glucose: Secondary | ICD-10-CM

## 2013-08-03 LAB — CBC WITH DIFFERENTIAL/PLATELET
Basophils Absolute: 0 10*3/uL (ref 0.0–0.1)
Basophils Relative: 1 % (ref 0–1)
EOS ABS: 0.2 10*3/uL (ref 0.0–0.7)
EOS PCT: 5 % (ref 0–5)
HEMATOCRIT: 45.6 % (ref 39.0–52.0)
Hemoglobin: 15.6 g/dL (ref 13.0–17.0)
LYMPHS ABS: 2.5 10*3/uL (ref 0.7–4.0)
Lymphocytes Relative: 54 % — ABNORMAL HIGH (ref 12–46)
MCH: 30.4 pg (ref 26.0–34.0)
MCHC: 34.2 g/dL (ref 30.0–36.0)
MCV: 88.9 fL (ref 78.0–100.0)
MONO ABS: 0.4 10*3/uL (ref 0.1–1.0)
Monocytes Relative: 8 % (ref 3–12)
Neutro Abs: 1.5 10*3/uL — ABNORMAL LOW (ref 1.7–7.7)
Neutrophils Relative %: 32 % — ABNORMAL LOW (ref 43–77)
PLATELETS: 142 10*3/uL — AB (ref 150–400)
RBC: 5.13 MIL/uL (ref 4.22–5.81)
RDW: 14.2 % (ref 11.5–15.5)
WBC: 4.6 10*3/uL (ref 4.0–10.5)

## 2013-08-03 NOTE — Progress Notes (Signed)
Patient ID: Caleb Johns, male   DOB: Apr 29, 1955, 58 y.o.   MRN: [id number]    This very nice 58 y.o.SBM presents for 3 month follow up with Hypertension, Hyperlipidemia, Pre-Diabetes and Vitamin D Deficiency. Patient is scheduled for RIH and Umb. Hernia Rpr on 6/232015 by Dr Zella Richer. He does report symptomatic pains with straining.   HTN predates since 2004. BP has been controlled and today's BP: 112/68 mmHg. Patient denies any cardiac type chest pain, palpitations, dyspnea/orthopnea/PND, dizziness, claudication, or dependent edema.   Hyperlipidemia is controlled with diet & meds. Last lipids in March as below were at goal. Patient denies myalgias or other med SE's.  Lab Results  Component Value Date   CHOL 112 05/11/2013   HDL 29* 05/11/2013   LDLCALC 48 05/11/2013   TRIG 175* 05/11/2013   CHOLHDL 3.9 05/11/2013    Also, the patient has history of PreDiabetes with A1c 6.0% in 2012with last A1c of 5.8% in Mar 2015. Patient denies any symptoms of reactive hypoglycemia, diabetic polys, paresthesias or visual blurring.   Further, Patient has history of Vitamin D Deficiency of 21 in 2008 with last vitamin D was 72 in Dec 21014. Patient supplements vitamin D without any suspected side-effects.   Medication List   acyclovir 800 MG tablet  Commonly known as:  ZOVIRAX  Take 1 tablet daily     allopurinol 300 MG tablet  Commonly known as:  ZYLOPRIM  Take 1 tablet (300 mg total) by mouth daily.     atenolol 100 MG tablet  Commonly known as:  TENORMIN  Take 1 tablet (100 mg total) by mouth daily.     glucose blood test strip  Commonly known as:  FREESTYLE TEST STRIPS  Use as instructed, DX 250.00     hydrochlorothiazide 25 MG tablet  Commonly known as:  HYDRODIURIL  Take 1 tablet (25 mg total) by mouth daily.     Magnesium 250 MG Tabs  Take 250 mg by mouth. daily     pravastatin 40 MG tablet  Commonly known as:  PRAVACHOL  Take 40 mg by mouth daily. Takes 1/2 tab mon wed fri     ranitidine 300 MG tablet  Commonly known as:  ZANTAC  Take 1 tablet (300 mg total) by mouth 2 (two) times daily.     sildenafil 100 MG tablet  Commonly known as:  VIAGRA  1/2 to 1 tab daily as needed for XXXX     tamsulosin 0.4 MG Caps capsule  Commonly known as:  FLOMAX  Take 1 capsule (0.4 mg total) by mouth daily.     vitamin B-12 500 MCG tablet  Commonly known as:  CYANOCOBALAMIN  Take 500 mcg by mouth daily.     Vitamin D (Cholecalciferol) 1000 UNITS Tabs  Take 1,000 Units by mouth. Takes 5000 units daily       Allergies  Allergen Reactions  . Ace Inhibitors     REACTION: cough  . Trilipix [Choline Fenofibrate] Other (See Comments)    Heart burn   PMHx:   Past Medical History  Diagnosis Date  . Hypertension   . Hyperlipidemia   . Obese   . Pre-diabetes   . Hypogonadism male   . Gout   . HSV-1 (herpes simplex virus 1) infection   . HSV-2 (herpes simplex virus 2) infection    FHx:    Reviewed / unchanged  SHx:    Reviewed / unchanged   Systems Review: Constitutional: Denies fever, chills, wt changes,  headaches, insomnia, fatigue, night sweats, change in appetite. Eyes: Denies redness, blurred vision, diplopia, discharge, itchy, watery eyes.  ENT: Denies discharge, congestion, post nasal drip, epistaxis, sore throat, earache, hearing loss, dental pain, tinnitus, vertigo, sinus pain, snoring.  CV: Denies chest pain, palpitations, irregular heartbeat, syncope, dyspnea, diaphoresis, orthopnea, PND, claudication or edema. Respiratory: denies cough, dyspnea, DOE, pleurisy, hoarseness, laryngitis, wheezing.  Gastrointestinal: Denies dysphagia, odynophagia, heartburn, reflux, water brash, abdominal pain or cramps, nausea, vomiting, bloating, diarrhea, constipation, hematemesis, melena, hematochezia  or hemorrhoids. Genitourinary: Denies dysuria, frequency, urgency, nocturia, hesitancy, discharge, hematuria or flank pain. Musculoskeletal: Denies arthralgias, myalgias,  stiffness, jt. swelling, pain, limping or strain/sprain.  Skin: Denies pruritus, rash, hives, warts, acne, eczema or change in skin lesion(s). Neuro: No weakness, tremor, incoordination, spasms, paresthesia or pain. Psychiatric: Denies confusion, memory loss or sensory loss. Endo: Denies change in weight, skin or hair change.  Heme/Lymph: No excessive bleeding, bruising or enlarged lymph nodes.  Exam:  BP 112/68  Pulse 98  Temp 97.5 F   Resp 16  Ht 5\' 9"  Wt 206 lb 3.2 oz   BMI 30.44 kg/m2  Appears well nourished - in no distress. Eyes: PERRLA, EOMs, conjunctiva no swelling or erythema. Sinuses: No frontal/maxillary tenderness ENT/Mouth: EAC's clear, TM's nl w/o erythema, bulging. Nares clear w/o erythema, swelling, exudates. Oropharynx clear without erythema or exudates. Oral hygiene is good. Tongue normal, non obstructing. Hearing intact.  Neck: Supple. Thyroid nl. Car 2+/2+ without bruits, nodes or JVD. Chest: Respirations nl with BS clear & equal w/o rales, rhonchi, wheezing or stridor.  Cor: Heart sounds normal w/ regular rate and rhythm without sig. murmurs, gallops, clicks, or rubs. Peripheral pulses normal and equal  without edema.  Abdomen: Soft & bowel sounds normal. Non-tender w/o guarding, rebound, hernias, masses, or organomegaly.  Lymphatics: Unremarkable.  Musculoskeletal: Full ROM all peripheral extremities, joint stability, 5/5 strength, and normal gait.  Skin: Warm, dry without exposed rashes, lesions or ecchymosis apparent.  Neuro: Cranial nerves intact, reflexes equal bilaterally. Sensory-motor testing grossly intact. Tendon reflexes grossly intact.  Pysch: Alert & oriented x 3. Insight and judgement nl & appropriate. No ideations.  Assessment and Plan:  1. Hypertension - Continue monitor blood pressure at home. Continue diet/meds same.  2. Hyperlipidemia - Continue diet/meds, exercise,& lifestyle modifications. Continue monitor periodic cholesterol/liver &  renal functions   3. Pre-diabetes/Insulin Resistance - Continue diet, exercise, lifestyle modifications. Monitor appropriate labs.  4. Vitamin D Deficiency - Continue supplementation.  5. RIH/Umbilical Hernia  Recommended regular exercise, BP monitoring, weight control, and discussed med and SE's. Recommended labs to assess and monitor clinical status. Further disposition pending results of labs.

## 2013-08-03 NOTE — Patient Instructions (Addendum)
20 PAX REASONER  08/03/2013   Your procedure is scheduled on: Tuesday June 23rd, 2015  Report to [facility] Main Entrance and follow signs to  [place] at 530 AM.  Call this number if you have problems the morning of surgery [phone]   Remember:  Do not eat food or drink liquids :After Midnight.     Take these medicines the morning of surgery with A SIP OF WATER: Atenolol                               You may not have any metal on your body including hair pins and piercings  Do not wear jewelry, make-up, lotions, powders, or deodorant.   Men may shave face and neck.  Do not bring valuables to the hospital. [place].  Contacts, dentures or bridgework may not be worn into surgery.  Leave suitcase in the car. After surgery it may be brought to your room.  For patients admitted to the hospital, checkout time is 11:00 AM the day of discharge.   Patients discharged the day of surgery will not be allowed to drive home.  Name and phone number of your driver:  Special Instructions: N/A ________________________________________________________________________  [facility] - Preparing for Surgery Before surgery, you can play an important role.  Because skin is not sterile, your skin needs to be as free of germs as possible.  You can reduce the number of germs on your skin by washing with CHG (chlorahexidine gluconate) soap before surgery.  CHG is an antiseptic cleaner which kills germs and bonds with the skin to continue killing germs even after washing. Please DO NOT use if you have an allergy to CHG or antibacterial soaps.  If your skin becomes reddened/irritated stop using the CHG and inform your nurse when you arrive at Short Stay. Do not shave (including legs and underarms) for at least 48 hours prior to the first CHG shower.  You may shave your face/neck. Please follow these instructions carefully:  1.  Shower with CHG Soap the night  before surgery and the  morning of Surgery.  2.  If you choose to wash your hair, wash your hair first as usual with your  normal  shampoo.  3.  After you shampoo, rinse your hair and body thoroughly to remove the  shampoo.                           4.  Use CHG as you would any other liquid soap.  You can apply chg directly  to the skin and wash                       Gently with a scrungie or clean washcloth.  5.  Apply the CHG Soap to your body ONLY FROM THE NECK DOWN.   Do not use on face/ open                           Wound or open sores. Avoid contact with eyes, ears mouth and genitals (private parts).                       Wash face,  Genitals (private parts) with your normal soap.  6.  Wash thoroughly, paying special attention to the area where your surgery  will be performed.  7.  Thoroughly rinse your body with warm water from the neck down.  8.  DO NOT shower/wash with your normal soap after using and rinsing off  the CHG Soap.                9.  Pat yourself dry with a clean towel.            10.  Wear clean pajamas.            11.  Place clean sheets on your bed the night of your first shower and do not  sleep with pets. Day of Surgery : Do not apply any lotions/deodorants the morning of surgery.  Please wear clean clothes to the hospital/surgery center.  FAILURE TO FOLLOW THESE INSTRUCTIONS MAY RESULT IN THE CANCELLATION OF YOUR SURGERY PATIENT SIGNATURE_________________________________  NURSE SIGNATURE__________________________________  ________________________________________________________________________

## 2013-08-03 NOTE — Progress Notes (Signed)
Myocardial perfusion 08-23-12 epic ekg 02-02-13 epic

## 2013-08-04 ENCOUNTER — Encounter (HOSPITAL_COMMUNITY): Payer: Self-pay

## 2013-08-04 ENCOUNTER — Encounter (HOSPITAL_COMMUNITY)
Admission: RE | Admit: 2013-08-04 | Discharge: 2013-08-04 | Disposition: A | Discharge status: Home / Self Care | Payer: BC Managed Care – PPO | Source: Ambulatory Visit | Attending: General Surgery | Admitting: General Surgery

## 2013-08-04 ENCOUNTER — Ambulatory Visit (HOSPITAL_COMMUNITY)
Admission: RE | Admit: 2013-08-04 | Discharge: 2013-08-04 | Disposition: A | Discharge status: Home / Self Care | Payer: BC Managed Care – PPO | Source: Ambulatory Visit | Attending: Anesthesiology | Admitting: Anesthesiology

## 2013-08-04 ENCOUNTER — Encounter (HOSPITAL_COMMUNITY): Payer: Self-pay | Admitting: Pharmacy Technician

## 2013-08-04 DIAGNOSIS — Z01818 Encounter for other preprocedural examination: Secondary | ICD-10-CM | POA: Insufficient documentation

## 2013-08-04 DIAGNOSIS — K4090 Unilateral inguinal hernia, without obstruction or gangrene, not specified as recurrent: Secondary | ICD-10-CM | POA: Insufficient documentation

## 2013-08-04 DIAGNOSIS — Z01812 Encounter for preprocedural laboratory examination: Secondary | ICD-10-CM | POA: Insufficient documentation

## 2013-08-04 HISTORY — DX: Gastro-esophageal reflux disease without esophagitis: K21.9

## 2013-08-04 HISTORY — DX: Pneumonia, unspecified organism: J18.9

## 2013-08-04 LAB — BASIC METABOLIC PANEL WITH GFR
BUN: 22 mg/dL (ref 6–23)
CALCIUM: 9.9 mg/dL (ref 8.4–10.5)
CO2: 30 meq/L (ref 19–32)
CREATININE: 1.22 mg/dL (ref 0.50–1.35)
Chloride: 98 mEq/L (ref 96–112)
GFR, Est African American: 76 mL/min
GFR, Est Non African American: 65 mL/min
GLUCOSE: 93 mg/dL (ref 70–99)
Potassium: 3.6 mEq/L (ref 3.5–5.3)
Sodium: 139 mEq/L (ref 135–145)

## 2013-08-04 LAB — TSH: TSH: 1.211 u[IU]/mL (ref 0.350–4.500)

## 2013-08-04 LAB — HEPATIC FUNCTION PANEL
ALBUMIN: 4.5 g/dL (ref 3.5–5.2)
ALT: 15 U/L (ref 0–53)
AST: 25 U/L (ref 0–37)
Alkaline Phosphatase: 81 U/L (ref 39–117)
BILIRUBIN TOTAL: 0.5 mg/dL (ref 0.2–1.2)
Bilirubin, Direct: 0.1 mg/dL (ref 0.0–0.3)
Indirect Bilirubin: 0.4 mg/dL (ref 0.2–1.2)
TOTAL PROTEIN: 7 g/dL (ref 6.0–8.3)

## 2013-08-04 LAB — COMPREHENSIVE METABOLIC PANEL
ALBUMIN: 4.2 g/dL (ref 3.5–5.2)
ALT: 14 U/L (ref 0–53)
AST: 26 U/L (ref 0–37)
Alkaline Phosphatase: 79 U/L (ref 39–117)
BUN: 20 mg/dL (ref 6–23)
CO2: 27 mEq/L (ref 19–32)
CREATININE: 0.89 mg/dL (ref 0.50–1.35)
Calcium: 9.4 mg/dL (ref 8.4–10.5)
Chloride: 98 mEq/L (ref 96–112)
GFR calc non Af Amer: >90 mL/min (ref >90)
Glucose, Bld: 107 mg/dL — ABNORMAL HIGH (ref 70–99)
Potassium: 3.3 mEq/L — ABNORMAL LOW (ref 3.7–5.3)
Sodium: 137 mEq/L (ref 137–147)
TOTAL PROTEIN: 7.2 g/dL (ref 6.0–8.3)
Total Bilirubin: 0.5 mg/dL (ref 0.3–1.2)

## 2013-08-04 LAB — LIPID PANEL
CHOL/HDL RATIO: 4.4 ratio
Cholesterol: 166 mg/dL (ref 0–200)
HDL: 38 mg/dL — ABNORMAL LOW (ref >39)
LDL Cholesterol: 79 mg/dL (ref 0–99)
TRIGLYCERIDES: 244 mg/dL — AB (ref <150)
VLDL: 49 mg/dL — AB (ref 0–40)

## 2013-08-04 LAB — CBC
HCT: 43.4 % (ref 39.0–52.0)
HEMOGLOBIN: 15.2 g/dL (ref 13.0–17.0)
MCH: 30.2 pg (ref 26.0–34.0)
MCHC: 35 g/dL (ref 30.0–36.0)
MCV: 86.1 fL (ref 78.0–100.0)
Platelets: 133 10*3/uL — ABNORMAL LOW (ref 150–400)
RBC: 5.04 MIL/uL (ref 4.22–5.81)
RDW: 12.8 % (ref 11.5–15.5)
WBC: 5 10*3/uL (ref 4.0–10.5)

## 2013-08-04 LAB — INSULIN, FASTING: INSULIN FASTING, SERUM: 30 u[IU]/mL — AB (ref 3–28)

## 2013-08-04 LAB — HEMOGLOBIN A1C
Hgb A1c MFr Bld: 5.9 % — ABNORMAL HIGH (ref <5.7)
Mean Plasma Glucose: 123 mg/dL — ABNORMAL HIGH (ref <117)

## 2013-08-04 LAB — PROTIME-INR
INR: 1.02 (ref 0.00–1.49)
PROTHROMBIN TIME: 13.2 s (ref 11.6–15.2)

## 2013-08-04 LAB — MAGNESIUM: Magnesium: 1.7 mg/dL (ref 1.5–2.5)

## 2013-08-04 LAB — VITAMIN D 25 HYDROXY (VIT D DEFICIENCY, FRACTURES): Vit D, 25-Hydroxy: 70 ng/mL (ref 30–89)

## 2013-08-04 NOTE — Progress Notes (Signed)
08/04/13 1405  OBSTRUCTIVE SLEEP APNEA  Have you ever been diagnosed with sleep apnea through a sleep study? No  Do you snore loudly (loud enough to be heard through closed doors)?  0  Do you often feel tired, fatigued, or sleepy during the daytime? 0  Has anyone observed you stop breathing during your sleep? 0  Do you have, or are you being treated for high blood pressure? 1  BMI more than 35 kg/m2? 0  Age over 57 years old? 1  Neck circumference greater than 40 cm/16 inches? 1  Gender: 1  Obstructive Sleep Apnea Score 4  Score 4 or greater  Results sent to PCP

## 2013-08-14 NOTE — Anesthesia Preprocedure Evaluation (Addendum)
Anesthesia Evaluation  Patient identified by MRN, date of birth, ID band Patient awake    Reviewed: Allergy & Precautions, H&P , NPO status , Patient's Chart, lab work & pertinent test results  Airway Mallampati: II TM Distance: >3 FB Neck ROM: Full    Dental  (+) Teeth Intact, Poor Dentition, Missing, Caps,    Pulmonary neg pulmonary ROS, former smoker,  breath sounds clear to auscultation  Pulmonary exam normal       Cardiovascular hypertension, Pt. on medications and Pt. on home beta blockers Rhythm:Regular Rate:Normal     Neuro/Psych negative neurological ROS  negative psych ROS   GI/Hepatic Neg liver ROS, GERD-  Medicated,  Endo/Other  negative endocrine ROSdiabetes (Borderline DM)  Renal/GU negative Renal ROS  negative genitourinary   Musculoskeletal negative musculoskeletal ROS (+)   Abdominal   Peds  Hematology negative hematology ROS (+)   Anesthesia Other Findings   Reproductive/Obstetrics                         Anesthesia Physical Anesthesia Plan  ASA: II  Anesthesia Plan: General   Post-op Pain Management:    Induction: Intravenous  Airway Management Planned: Oral ETT  Additional Equipment:   Intra-op Plan:   Post-operative Plan: Extubation in OR  Informed Consent: I have reviewed the patients History and Physical, chart, labs and discussed the procedure including the risks, benefits and alternatives for the proposed anesthesia with the patient or authorized representative who has indicated his/her understanding and acceptance.   Dental advisory given  Plan Discussed with: CRNA  Anesthesia Plan Comments:         Anesthesia Quick Evaluation

## 2013-08-15 ENCOUNTER — Ambulatory Visit (HOSPITAL_COMMUNITY)
Admission: RE | Admit: 2013-08-15 | Discharge: 2013-08-15 | Disposition: A | Discharge status: Home / Self Care | Payer: BC Managed Care – PPO | Source: Ambulatory Visit | Attending: General Surgery | Admitting: General Surgery

## 2013-08-15 ENCOUNTER — Ambulatory Visit (HOSPITAL_COMMUNITY): Payer: BC Managed Care – PPO | Admitting: Anesthesiology

## 2013-08-15 ENCOUNTER — Encounter (HOSPITAL_COMMUNITY): Payer: BC Managed Care – PPO | Admitting: Anesthesiology

## 2013-08-15 ENCOUNTER — Encounter (HOSPITAL_COMMUNITY)
Admission: RE | Disposition: A | Discharge status: Home / Self Care | Payer: Self-pay | Source: Ambulatory Visit | Attending: General Surgery

## 2013-08-15 ENCOUNTER — Encounter (HOSPITAL_COMMUNITY): Payer: Self-pay | Admitting: *Deleted

## 2013-08-15 DIAGNOSIS — K429 Umbilical hernia without obstruction or gangrene: Secondary | ICD-10-CM | POA: Insufficient documentation

## 2013-08-15 DIAGNOSIS — K4090 Unilateral inguinal hernia, without obstruction or gangrene, not specified as recurrent: Secondary | ICD-10-CM | POA: Insufficient documentation

## 2013-08-15 DIAGNOSIS — I10 Essential (primary) hypertension: Secondary | ICD-10-CM | POA: Insufficient documentation

## 2013-08-15 DIAGNOSIS — E669 Obesity, unspecified: Secondary | ICD-10-CM | POA: Insufficient documentation

## 2013-08-15 DIAGNOSIS — M109 Gout, unspecified: Secondary | ICD-10-CM | POA: Insufficient documentation

## 2013-08-15 DIAGNOSIS — E785 Hyperlipidemia, unspecified: Secondary | ICD-10-CM | POA: Insufficient documentation

## 2013-08-15 DIAGNOSIS — Z87891 Personal history of nicotine dependence: Secondary | ICD-10-CM | POA: Insufficient documentation

## 2013-08-15 DIAGNOSIS — B009 Herpesviral infection, unspecified: Secondary | ICD-10-CM | POA: Insufficient documentation

## 2013-08-15 DIAGNOSIS — Z79899 Other long term (current) drug therapy: Secondary | ICD-10-CM | POA: Insufficient documentation

## 2013-08-15 DIAGNOSIS — K219 Gastro-esophageal reflux disease without esophagitis: Secondary | ICD-10-CM | POA: Insufficient documentation

## 2013-08-15 HISTORY — PX: INSERTION OF MESH: SHX5868

## 2013-08-15 HISTORY — PX: LAPAROSCOPIC INGUINAL HERNIA WITH UMBILICAL HERNIA: SHX5658

## 2013-08-15 LAB — GLUCOSE, CAPILLARY: Glucose-Capillary: 106 mg/dL — ABNORMAL HIGH (ref 70–99)

## 2013-08-15 SURGERY — LAPAROSCOPIC INGUINAL HERNIA WITH UMBILICAL HERNIA
Anesthesia: General | Site: Groin | Laterality: Right

## 2013-08-15 MED ORDER — CISATRACURIUM BESYLATE (PF) 10 MG/5ML IV SOLN
INTRAVENOUS | Status: DC | PRN
  Administered 2013-08-15: 10 mg via INTRAVENOUS
  Administered 2013-08-15: 5 mg via INTRAVENOUS

## 2013-08-15 MED ORDER — DEXAMETHASONE SODIUM PHOSPHATE 10 MG/ML IJ SOLN
INTRAMUSCULAR | Status: AC
  Filled 2013-08-15: qty 1

## 2013-08-15 MED ORDER — LACTATED RINGERS IV SOLN
INTRAVENOUS | Status: DC | PRN
  Administered 2013-08-15 (×2): via INTRAVENOUS

## 2013-08-15 MED ORDER — LACTATED RINGERS IR SOLN
Status: DC | PRN
  Administered 2013-08-15: 1000 mL

## 2013-08-15 MED ORDER — BUPIVACAINE-EPINEPHRINE 0.5% -1:200000 IJ SOLN
INTRAMUSCULAR | Status: DC | PRN
  Administered 2013-08-15: 20 mL

## 2013-08-15 MED ORDER — HYDROMORPHONE HCL PF 1 MG/ML IJ SOLN: 0.2500 mg | INTRAMUSCULAR | Status: DC | PRN

## 2013-08-15 MED ORDER — GLYCOPYRROLATE 0.2 MG/ML IJ SOLN
INTRAMUSCULAR | Status: AC
  Filled 2013-08-15: qty 2

## 2013-08-15 MED ORDER — ONDANSETRON HCL 4 MG/2ML IJ SOLN
INTRAMUSCULAR | Status: DC | PRN
  Administered 2013-08-15 (×2): 2 mg via INTRAVENOUS

## 2013-08-15 MED ORDER — OXYCODONE HCL 5 MG PO TABS: 5.0000 mg | ORAL_TABLET | Freq: Four times a day (QID) | ORAL | Status: DC | PRN

## 2013-08-15 MED ORDER — ACETAMINOPHEN 650 MG RE SUPP
650.0000 mg | RECTAL | Status: DC | PRN
  Filled 2013-08-15: qty 1

## 2013-08-15 MED ORDER — EPHEDRINE SULFATE 50 MG/ML IJ SOLN
INTRAMUSCULAR | Status: AC
  Filled 2013-08-15: qty 1

## 2013-08-15 MED ORDER — ACETAMINOPHEN 325 MG PO TABS: 650.0000 mg | ORAL_TABLET | ORAL | Status: DC | PRN

## 2013-08-15 MED ORDER — DEXAMETHASONE SODIUM PHOSPHATE 4 MG/ML IJ SOLN
INTRAMUSCULAR | Status: DC | PRN
  Administered 2013-08-15: 10 mg via INTRAVENOUS

## 2013-08-15 MED ORDER — EPHEDRINE SULFATE 50 MG/ML IJ SOLN
INTRAMUSCULAR | Status: DC | PRN
  Administered 2013-08-15: 10 mg via INTRAVENOUS
  Administered 2013-08-15: 5 mg via INTRAVENOUS

## 2013-08-15 MED ORDER — SODIUM CHLORIDE 0.9 % IJ SOLN
INTRAMUSCULAR | Status: AC
  Filled 2013-08-15: qty 10

## 2013-08-15 MED ORDER — MIDAZOLAM HCL 5 MG/5ML IJ SOLN
INTRAMUSCULAR | Status: DC | PRN
  Administered 2013-08-15: 1 mg via INTRAVENOUS

## 2013-08-15 MED ORDER — ONDANSETRON HCL 4 MG/2ML IJ SOLN
INTRAMUSCULAR | Status: AC
  Filled 2013-08-15: qty 2

## 2013-08-15 MED ORDER — NEOSTIGMINE METHYLSULFATE 10 MG/10ML IV SOLN
INTRAVENOUS | Status: AC
Start: 2013-08-15 — End: 2013-08-15
  Filled 2013-08-15: qty 1

## 2013-08-15 MED ORDER — MORPHINE SULFATE 10 MG/ML IJ SOLN: 2.0000 mg | INTRAMUSCULAR | Status: DC | PRN

## 2013-08-15 MED ORDER — TAMSULOSIN HCL 0.4 MG PO CAPS: 0.4000 mg | ORAL_CAPSULE | Freq: Every day | ORAL | Status: DC

## 2013-08-15 MED ORDER — MIDAZOLAM HCL 2 MG/2ML IJ SOLN
INTRAMUSCULAR | Status: AC
  Filled 2013-08-15: qty 2

## 2013-08-15 MED ORDER — CISATRACURIUM BESYLATE 20 MG/10ML IV SOLN
INTRAVENOUS | Status: AC
  Filled 2013-08-15: qty 10

## 2013-08-15 MED ORDER — PHENYLEPHRINE 40 MCG/ML (10ML) SYRINGE FOR IV PUSH (FOR BLOOD PRESSURE SUPPORT)
PREFILLED_SYRINGE | INTRAVENOUS | Status: AC
  Filled 2013-08-15: qty 10

## 2013-08-15 MED ORDER — OXYCODONE HCL 5 MG PO TABS
5.0000 mg | ORAL_TABLET | ORAL | Status: DC | PRN
  Administered 2013-08-15: 5 mg via ORAL
  Filled 2013-08-15: qty 1

## 2013-08-15 MED ORDER — PROMETHAZINE HCL 25 MG/ML IJ SOLN: 6.2500 mg | INTRAMUSCULAR | Status: DC | PRN

## 2013-08-15 MED ORDER — ONDANSETRON HCL 4 MG/2ML IJ SOLN: 4.0000 mg | INTRAMUSCULAR | Status: DC | PRN

## 2013-08-15 MED ORDER — CEFAZOLIN SODIUM-DEXTROSE 2-3 GM-% IV SOLR
2.0000 g | Freq: Once | INTRAVENOUS | Status: AC
  Administered 2013-08-15: 2 g via INTRAVENOUS

## 2013-08-15 MED ORDER — LIDOCAINE HCL (CARDIAC) 20 MG/ML IV SOLN
INTRAVENOUS | Status: DC | PRN
  Administered 2013-08-15: 30 mg via INTRAVENOUS

## 2013-08-15 MED ORDER — FENTANYL CITRATE 0.05 MG/ML IJ SOLN
INTRAMUSCULAR | Status: DC | PRN
  Administered 2013-08-15 (×2): 50 ug via INTRAVENOUS
  Administered 2013-08-15 (×2): 25 ug via INTRAVENOUS
  Administered 2013-08-15: 50 ug via INTRAVENOUS

## 2013-08-15 MED ORDER — CEFAZOLIN SODIUM-DEXTROSE 2-3 GM-% IV SOLR
INTRAVENOUS | Status: AC
  Filled 2013-08-15: qty 50

## 2013-08-15 MED ORDER — PROPOFOL 10 MG/ML IV BOLUS
INTRAVENOUS | Status: AC
  Filled 2013-08-15: qty 20

## 2013-08-15 MED ORDER — LIDOCAINE HCL (CARDIAC) 20 MG/ML IV SOLN
INTRAVENOUS | Status: AC
  Filled 2013-08-15: qty 5

## 2013-08-15 MED ORDER — SODIUM CHLORIDE 0.9 % IJ SOLN: 3.0000 mL | INTRAMUSCULAR | Status: DC | PRN

## 2013-08-15 MED ORDER — 0.9 % SODIUM CHLORIDE (POUR BTL) OPTIME
TOPICAL | Status: DC | PRN
  Administered 2013-08-15: 1000 mL

## 2013-08-15 MED ORDER — LACTATED RINGERS IV SOLN: INTRAVENOUS | Status: DC

## 2013-08-15 MED ORDER — NEOSTIGMINE METHYLSULFATE 10 MG/10ML IV SOLN
INTRAVENOUS | Status: DC | PRN
  Administered 2013-08-15: 5 mg via INTRAVENOUS

## 2013-08-15 MED ORDER — BUPIVACAINE-EPINEPHRINE (PF) 0.5% -1:200000 IJ SOLN
INTRAMUSCULAR | Status: AC
  Filled 2013-08-15: qty 30

## 2013-08-15 MED ORDER — FENTANYL CITRATE 0.05 MG/ML IJ SOLN
INTRAMUSCULAR | Status: AC
  Filled 2013-08-15: qty 5

## 2013-08-15 MED ORDER — GLYCOPYRROLATE 0.2 MG/ML IJ SOLN
INTRAMUSCULAR | Status: DC | PRN
  Administered 2013-08-15: 0.6 mg via INTRAVENOUS
  Administered 2013-08-15: 0.2 mg via INTRAVENOUS
  Administered 2013-08-15 (×2): 0.1 mg via INTRAVENOUS

## 2013-08-15 MED ORDER — GLYCOPYRROLATE 0.2 MG/ML IJ SOLN
INTRAMUSCULAR | Status: AC
Start: 2013-08-15 — End: 2013-08-15
  Filled 2013-08-15: qty 3

## 2013-08-15 MED ORDER — PROPOFOL 10 MG/ML IV BOLUS
INTRAVENOUS | Status: DC | PRN
  Administered 2013-08-15: 200 mg via INTRAVENOUS

## 2013-08-15 SURGICAL SUPPLY — 49 items
APL SKNCLS STERI-STRIP NONHPOA (GAUZE/BANDAGES/DRESSINGS) ×2
APPLIER CLIP 5 13 M/L LIGAMAX5 (MISCELLANEOUS) ×3
APR CLP MED LRG 5 ANG JAW (MISCELLANEOUS) ×2
BENZOIN TINCTURE PRP APPL 2/3 (GAUZE/BANDAGES/DRESSINGS) ×3 IMPLANT
CABLE HIGH FREQUENCY MONO STRZ (ELECTRODE) ×1 IMPLANT
CHLORAPREP W/TINT 26ML (MISCELLANEOUS) ×3 IMPLANT
CLIP APPLIE 5 13 M/L LIGAMAX5 (MISCELLANEOUS) IMPLANT
DECANTER SPIKE VIAL GLASS SM (MISCELLANEOUS) ×2 IMPLANT
DISSECT BALLN SPACEMKR + OVL (BALLOONS) ×3
DISSECTOR BALLN SPACEMKR + OVL (BALLOONS) ×2 IMPLANT
DISSECTOR BLUNT TIP ENDO 5MM (MISCELLANEOUS) ×3 IMPLANT
DRAPE LAPAROSCOPIC ABDOMINAL (DRAPES) ×3 IMPLANT
DRAPE UTILITY XL STRL (DRAPES) ×3 IMPLANT
DRSG TEGADERM 2-3/8X2-3/4 SM (GAUZE/BANDAGES/DRESSINGS) ×6 IMPLANT
DRSG TEGADERM 4X4.75 (GAUZE/BANDAGES/DRESSINGS) ×1 IMPLANT
ELECT PENCIL ROCKER SW 15FT (MISCELLANEOUS) ×1 IMPLANT
ELECT REM PT RETURN 9FT ADLT (ELECTROSURGICAL) ×3
ELECTRODE REM PT RTRN 9FT ADLT (ELECTROSURGICAL) ×2 IMPLANT
GAUZE SPONGE 2X2 8PLY STRL LF (GAUZE/BANDAGES/DRESSINGS) IMPLANT
GLOVE BIOGEL PI IND STRL 7.0 (GLOVE) ×2 IMPLANT
GLOVE BIOGEL PI INDICATOR 7.0 (GLOVE) ×2
GLOVE ECLIPSE 8.0 STRL XLNG CF (GLOVE) ×3 IMPLANT
GLOVE INDICATOR 8.0 STRL GRN (GLOVE) ×5 IMPLANT
GOWN STRL REUS W/TWL LRG LVL3 (GOWN DISPOSABLE) ×2 IMPLANT
GOWN STRL REUS W/TWL XL LVL3 (GOWN DISPOSABLE) ×7 IMPLANT
KIT BASIN OR (CUSTOM PROCEDURE TRAY) ×3 IMPLANT
MARKER SKIN DUAL TIP RULER LAB (MISCELLANEOUS) ×3 IMPLANT
MESH HERNIA 6X6 BARD (Mesh General) IMPLANT
MESH HERNIA BARD 6X6 (Mesh General) ×1 IMPLANT
MESH PROLENE 3X6 (Mesh General) ×1 IMPLANT
NDL INSUFFLATION 14GA 120MM (NEEDLE) IMPLANT
NEEDLE INSUFFLATION 14GA 120MM (NEEDLE) IMPLANT
SCISSORS LAP 5X35 DISP (ENDOMECHANICALS) IMPLANT
SET IRRIG TUBING LAPAROSCOPIC (IRRIGATION / IRRIGATOR) ×1 IMPLANT
SOLUTION ANTI FOG 6CC (MISCELLANEOUS) ×3 IMPLANT
SPONGE GAUZE 2X2 STER 10/PKG (GAUZE/BANDAGES/DRESSINGS) ×1
SPONGE GAUZE 4X4 12PLY (GAUZE/BANDAGES/DRESSINGS) ×1 IMPLANT
STRIP CLOSURE SKIN 1/2X4 (GAUZE/BANDAGES/DRESSINGS) ×3 IMPLANT
SUT MNCRL AB 4-0 PS2 18 (SUTURE) ×3 IMPLANT
SUT PROLENE 0 CT 2 (SUTURE) ×1 IMPLANT
SUT SURGILON 0 BLK (SUTURE) ×1 IMPLANT
SUT VIC AB 3-0 SH 27 (SUTURE) ×3
SUT VIC AB 3-0 SH 27X BRD (SUTURE) IMPLANT
TACKER 5MM HERNIA 3.5CML NAB (ENDOMECHANICALS) ×1 IMPLANT
TOWEL OR 17X26 10 PK STRL BLUE (TOWEL DISPOSABLE) ×3 IMPLANT
TOWEL OR NON WOVEN STRL DISP B (DISPOSABLE) ×1 IMPLANT
TRAY LAP CHOLE (CUSTOM PROCEDURE TRAY) ×3 IMPLANT
TROCAR CANNULA W/PORT DUAL 5MM (MISCELLANEOUS) ×6 IMPLANT
TUBING INSUFFLATION 10FT LAP (TUBING) ×3 IMPLANT

## 2013-08-15 NOTE — Op Note (Signed)
Operative Note  Caleb Johns male 58 y.o. 08/15/2013  PREOPERATIVE DX:  Right inguinal hernia. Umbilical hernia.  POSTOPERATIVE DX:  Same  PROCEDURE:  Laparoscopic right inguinal hernia repair with mesh.  Umbilical hernia repair with mesh.         Surgeon: Odis Hollingshead   Assistants: None  Anesthesia: General endotracheal anesthesia  Indications:   This is a 58 year old male with a symptomatic right inguinal hernia and a symptomatic umbilical hernia. He now presents for repair.    Procedure Detail:  He was seen in the holding area in the right groin was marked with my initials. He voided. He was taken to the operating room placed supine on the operating table and general anesthetic was given. The hair in the abdominal wall and in the groin area was clipped. The abdominal wall and groin areas were sterilely prepped and draped.  Marcaine was infiltrated in the subcutaneous tissues of the periumbilical region. A curvilinear subumbilical incision was made through the skin and subcutaneous tissue. Fascia was identified. Right anterior rectus sheath was identified. A small incision was made in the right anterior rectus sheath. The underlying rectus muscle was swept laterally exposing the posterior rectus sheath. A balloon dissection device was then placed into the extraperitoneal space. Under laparoscopic vision balloon dissection was then performed of the extraperitoneal space in the lower abdomen from the midline over to the right side. The balloon was then removed and CO2 gas was insufflated.  Two 5 mm trocars were then placed just to the left of the lower midline.  Using blunt dissection, the symphysis pubis was exposed. Cooper's ligament was exposed on the right side. A direct hernia was visualized containing omentum. This was carefully reduced. The inferior epigastric artery and vein were displaced posteriorly and in the field of dissection. These were divided between clips. Using  blunt dissection the anterior and lateral down the walls were exposed up to the level of the umbilicus. The spermatic cord was isolated and a window created around it. No indirect hernia defect was noted. Peritoneum was stripped off the cord back to the level of the umbilicus.  A piece of polypropylene mesh was brought into the field and cut to 13 cm x 15 cm. A partial longitudinal slit was cut into it. It was then placed into the right extraperitoneal space and deployed. 2 tails were wrapped around the spermatic cord. The mesh was then anchored to Cooper's ligament, the anterior bowel wall, and the lateral abdominal wall with spiral tacks. This provided adequate coverage with good overlap of the direct, indirect, and femoral spaces.  The area was inspected and hemostasis was adequate. The CO2 gas was released and the extraperitoneal contents approximated the mesh. Trocars were removed.  A small skin incisions were closed with 4 Monocryl subcuticular stitch.  The umbilical hernia was approached. The umbilical stalk was dissected off the fascia exposing the hernia which measured approximately 2-3 cm in size. Hernia contents consisting of extra peritoneal fat was reduced. Subcutaneous tissues dissected free from the fascia 34 cm around the primary defect. The deep dermis and primary closed with interrupted 0 Surgilon sutures left long. A piece of polypropylene mesh was brought into the field and cut to allow 3-4 center his overlap in all directions. The primary repair sutures were threaded through the mesh and tied down anchoring the mesh directly primary repair. The periphery of the mesh was anchored to the fascia with running 0 Prolene suture. Excess mesh was trimmed and  removed. Marcaine was infiltrated into the fascia. The anterior rectus sheath defect was closed with interrupted 0 Vicryl sutures. This was done before the mesh was placed. The wound was inspected and hemostasis was adequate.  The umbilical  stalk was reattached to the mesh with interrupted 3-0 Vicryl suture. The subcutaneous tissue closed over the mesh with running 0 Vicryl suture. The skin was closed with running 4 Monocryl subcuticular stitch. Steri-Strips and sterile dressings were applied to all wounds.  He tolerated the procedure without any apparent complications and was taken to the recovery room in satisfactory condition.Needle, sponge, and instrument count was reported to be correct.   Estimated Blood Loss:  150 ml         Drains: none  Blood Given: none          Specimens: none        Complications:  * No complications entered in OR log *         Disposition: PACU - hemodynamically stable.         Condition: stable

## 2013-08-15 NOTE — Discharge Instructions (Signed)
[facility] _______[facility]  UMBILICAL OR INGUINAL HERNIA REPAIR: POST OP INSTRUCTIONS  Always review your discharge instruction sheet given to you by the facility where your surgery was performed. IF YOU HAVE DISABILITY OR FAMILY LEAVE FORMS, YOU MUST BRING THEM TO THE OFFICE FOR PROCESSING.   DO NOT GIVE THEM TO YOUR DOCTOR.  1. A  prescription for pain medication may be given to you upon discharge.  Take your pain medication as prescribed, if needed.  If narcotic pain medicine is not needed, then you may take acetaminophen (Tylenol) or ibuprofen (Advil) as needed. 2. Take your usually prescribed medications unless otherwise directed. 3. If you need a refill on your pain medication, please contact your pharmacy.  They will contact our office to request authorization. Prescriptions will not be filled after 5 pm or on week-ends. 4. You should follow a light diet the first 24 hours after arrival home, such as soup and crackers, etc.  Be sure to include lots of fluids daily.  Resume your normal diet the day after surgery. 5. Most patients will experience some swelling and bruising around the umbilicus or in the groin and scrotum.  Ice packs and reclining will help.  Swelling and bruising can take several days to resolve.  6. It is common to experience some constipation if taking pain medication after surgery.  Increasing fluid intake and taking a stool softener (such as Colace) will usually help or prevent this problem from occurring.  A mild laxative (Milk of Magnesia or Miralax) should be taken according to package directions if there are no bowel movements after 48 hours. 7. Unless discharge instructions indicate otherwise, you may remove your bandages 72 hours after surgery, and you may shower at that time.  You may have steri-strips (small skin tapes) in place directly over the incision.  These strips should be left on the skin.  If your surgeon used skin glue on the incision, you may  shower in 24 hours.  The glue will flake off over the next 2-3 weeks.  Any sutures or staples will be removed at the office during your follow-up visit. 8. ACTIVITIES:  You may resume regular (light) daily activities beginning the next day--such as daily self-care, walking, climbing stairs--gradually increasing activities as tolerated.  You may have sexual intercourse when it is comfortable.  Refrain from any heavy lifting or straining-nothing over 10 pounds for 6 weeks. a. You may drive when you are no longer taking prescription pain medication, you can comfortably wear a seatbelt, and you can safely maneuver your car and apply brakes. b. RETURN TO WORK:  _Desk work in 1-2 weeks.  Full duty in 6-8 weeks._________________________________________________________ 9. You should see your doctor in the office for a follow-up appointment approximately 2-3 weeks after your surgery.  Make sure that you call for this appointment within a day or two after you arrive home to insure a convenient appointment time. 10. OTHER INSTRUCTIONS:  __________________________________________________________________________________________________________________________________________________________________________________________  WHEN TO CALL YOUR DOCTOR: 1. Fever over 101.0 2. Inability to urinate 3. Nausea and/or vomiting 4. Extreme swelling or bruising 5. Continued bleeding from incision. 6. Increased pain, redness, or drainage from the incision  The clinic staff is available to answer your questions during regular business hours.  Please dont hesitate to call and ask to speak to one of the nurses for clinical concerns.  If you have a medical emergency, go to the nearest emergency room or call 911.  A surgeon from [facility] is  always on call at the hospital   [address] [place], [place], [place]  [address] ?  [address], [place],    [address] [phone] ? [phone] ? FAX  [phone] Web site: [email or web address]

## 2013-08-15 NOTE — H&P (View-Only) (Signed)
Patient ID: Caleb Johns, male   DOB: 1955-04-19, 58 y.o.   MRN: [id number]  Chief Complaint  Patient presents with  . eval hernia    HPI Caleb Johns is a 58 y.o. male.   HPI  He is referred by Dr. Melford Aase For further evaluation and treatment of a symptomatic right inguinal hernia. He also has a slightly symptomatic umbilical hernia.  He does some heavy lifting at work. Sometimes he has to push the hernia back in. He does have some discomfort from the hernia at times. He does have to strain to start his urinary stream. Denies constipation.  Past Medical History  Diagnosis Date  . Hypertension   . Hyperlipidemia   . Obese   . Pre-diabetes   . Hypogonadism male   . Gout   . HSV-1 (herpes simplex virus 1) infection   . HSV-2 (herpes simplex virus 2) infection     History reviewed. No pertinent past surgical history.  Family History  Problem Relation Age of Onset  . Diabetes Mother   . Hypertension Mother   . Hypertension Brother   . Hypertension Brother   . Hypertension Brother     Social History History  Substance Use Topics  . Smoking status: Former Smoker    Quit date: 02/23/2002  . Smokeless tobacco: Not on file  . Alcohol Use: No    Allergies  Allergen Reactions  . Ace Inhibitors     REACTION: cough  . Trilipix [Choline Fenofibrate] Other (See Comments)    Heart burn    Current Outpatient Prescriptions  Medication Sig Dispense Refill  . acyclovir (ZOVIRAX) 800 MG tablet Take 1 tablet daily  30 tablet  1  . allopurinol (ZYLOPRIM) 300 MG tablet Take 1 tablet (300 mg total) by mouth daily.  90 tablet  1  . atenolol (TENORMIN) 100 MG tablet Take 1 tablet (100 mg total) by mouth daily.  90 tablet  1  . glucose blood (FREESTYLE TEST STRIPS) test strip Use as instructed, DX 250.00  100 each  12  . hydrochlorothiazide (HYDRODIURIL) 25 MG tablet Take 1 tablet (25 mg total) by mouth daily.  90 tablet  1  . Magnesium 250 MG TABS Take 250 mg by mouth. daily      .  pravastatin (PRAVACHOL) 40 MG tablet Take 40 mg by mouth daily. Takes 1/2 tab mon wed fri      . ranitidine (ZANTAC) 300 MG tablet Take 1 tablet (300 mg total) by mouth 2 (two) times daily.  270 tablet  1  . sildenafil (VIAGRA) 100 MG tablet 1/2 to 1 tab daily as needed for XXXX  30 tablet  3  . vitamin B-12 (CYANOCOBALAMIN) 500 MCG tablet Take 500 mcg by mouth daily.      . Vitamin D, Cholecalciferol, 1000 UNITS TABS Take 1,000 Units by mouth. Takes 5000 units daily      . tamsulosin (FLOMAX) 0.4 MG CAPS capsule Take 1 capsule (0.4 mg total) by mouth daily.  10 capsule  0   No current facility-administered medications for this visit.    Review of Systems Review of Systems  Constitutional: Negative.   HENT: Negative.   Respiratory: Negative.   Cardiovascular: Negative.   Gastrointestinal: Negative for abdominal pain and constipation.  Genitourinary: Positive for difficulty urinating. Negative for testicular pain.  Hematological: Negative.     Blood pressure 126/74, pulse 77, temperature 97.4 F (36.3 C), height 5\' 8"  (1.727 m), weight 205 lb (92.987 kg).  Physical Exam Physical Exam  Constitutional: No distress.  Overweight male,  HENT:  Head: Normocephalic and atraumatic.  Eyes: No scleral icterus.  Cardiovascular: Normal rate and regular rhythm.   Pulmonary/Chest: Effort normal and breath sounds normal.  Abdominal: Soft.  Reducible umbilical hernia  Genitourinary:  Reducible right inguinal bulge consistent with hernia, no left inguinal bulge.  No testicular masses  Neurological: He is alert.  Skin: Skin is warm and dry.  Psychiatric: He has a normal mood and affect. His behavior is normal.    Data Reviewed Dr. Idell Pickles note.  Assessment    Symptomatic right inguinal hernia and umbilical hernia. He is interested in having both repaired. He also has some symptoms of BPH.     Plan    Laparoscopic right inguinal hernia repair with mesh, open umbilical hernia repair  with mesh.  Will also treat him with perioperative Flomax to try to avoid urinary retention.  I have explained the procedure, risks, and aftercare of inguinal and umbilical hernia repair.  Risks include but are not limited to bleeding, infection, wound problems, anesthesia, recurrence, bladder or intestine injury, urinary retention, testicular dysfunction, chronic pain, mesh problems.  He seems to understand and agrees to proceed.        Rhunette Croft Hinata Diener 07/19/2013, 10:08 AM

## 2013-08-15 NOTE — Transfer of Care (Signed)
Immediate Anesthesia Transfer of Care Note  Patient: Caleb Johns  Procedure(s) Performed: Procedure(s) with comments: LAPAROSCOPIC RIGH  INGUINAL HERNIA WITH OPEN UMBILICAL HERNIA (Right) - umbilical repair with mesh INSERTION OF MESH (N/A)  Patient Location: PACU  Anesthesia Type:General  Level of Consciousness: awake and sedated  Airway & Oxygen Therapy: Patient Spontanous Breathing and Patient connected to face mask oxygen  Post-op Assessment: Report given to PACU RN and Post -op Vital signs reviewed and stable  Post vital signs: stable  Complications: No apparent anesthesia complications

## 2013-08-15 NOTE — Interval H&P Note (Signed)
History and Physical Interval Note:  08/15/2013 7:14 AM  Caleb Johns  has presented today for surgery, with the diagnosis of right inguinal hernia,  umbilical hernia  The various methods of treatment have been discussed with the patient and family. After consideration of risks, benefits and other options for treatment, the patient has consented to  Procedure(s): LAPAROSCOPIC RIGH  INGUINAL HERNIA WITH OPENUMBILICAL HERNIA (Right) INSERTION OF MESH (N/A) as a surgical intervention .  The patient's history has been reviewed, patient examined, no change in status, stable for surgery.  I have reviewed the patient's chart and labs.  Questions were answered to the patient's satisfaction.     ROSENBOWER,TODD Lenna Sciara

## 2013-08-15 NOTE — Anesthesia Postprocedure Evaluation (Signed)
Anesthesia Post Note  Patient: Caleb Johns  Procedure(s) Performed: Procedure(s) (LRB): LAPAROSCOPIC RIGH  INGUINAL HERNIA WITH OPEN UMBILICAL HERNIA (Right) INSERTION OF MESH (N/A)  Anesthesia type: General  Patient location: PACU  Post pain: Pain level controlled  Post assessment: Post-op Vital signs reviewed  Last Vitals:  Filed Vitals:   08/15/13 1209  BP: 134/77  Pulse: 49  Temp: 36.3 C  Resp: 16    Post vital signs: Reviewed  Level of consciousness: sedated  Complications: No apparent anesthesia complications

## 2013-08-16 ENCOUNTER — Encounter (HOSPITAL_COMMUNITY): Payer: Self-pay | Admitting: General Surgery

## 2013-08-23 ENCOUNTER — Telehealth (INDEPENDENT_AMBULATORY_CARE_PROVIDER_SITE_OTHER): Payer: Self-pay | Admitting: *Deleted

## 2013-08-23 NOTE — Telephone Encounter (Signed)
Pt called.  He is s/p RIH surgery, 6.23.15.  He wanted to know if he could get a shower.  I informed pt that it was fine to take a shower and the steri strips will fall off their self.  Pt verbalized understanding.  Anderson Malta

## 2013-08-29 ENCOUNTER — Encounter (INDEPENDENT_AMBULATORY_CARE_PROVIDER_SITE_OTHER): Payer: Self-pay | Admitting: General Surgery

## 2013-08-29 ENCOUNTER — Ambulatory Visit (INDEPENDENT_AMBULATORY_CARE_PROVIDER_SITE_OTHER): Payer: BC Managed Care – PPO | Admitting: General Surgery

## 2013-08-29 VITALS — BP 129/84 | HR 78 | Temp 98.6°F | Resp 18 | Ht 68.0 in | Wt 201.8 lb

## 2013-08-29 DIAGNOSIS — Z4889 Encounter for other specified surgical aftercare: Secondary | ICD-10-CM

## 2013-08-29 MED ORDER — HYDROCODONE-ACETAMINOPHEN 5-325 MG PO TABS: 1.0000 | ORAL_TABLET | Freq: Four times a day (QID) | ORAL | Status: DC | PRN

## 2013-08-29 NOTE — Patient Instructions (Signed)
6 weeks after the surgery, may resume normal activities as tolerated, as discussed. 

## 2013-08-29 NOTE — Progress Notes (Signed)
He presents for postop followup after laparoscopic right inguinal hernia repair with mesh and umbilical hernia repair with mesh.  Post op pain is improving.  No difficulty voiding or having BMs.  Swelling is decreasing.  P.E.  ABD:  Soft, incisions clean/dry/intact with some swelling  GU:  Right groin swelling is minimal, repair is solid.  Assessment:  Doing well post hernia repair.  Plan:  Continue light activities for 6 weeks postop then slowly start to resume normal activities as tolerated (no pain or discomfort). Return visit as needed.  His was given Norco # 30 for pain.

## 2013-09-20 ENCOUNTER — Other Ambulatory Visit (INDEPENDENT_AMBULATORY_CARE_PROVIDER_SITE_OTHER): Payer: Self-pay | Admitting: *Deleted

## 2013-09-20 ENCOUNTER — Telehealth (INDEPENDENT_AMBULATORY_CARE_PROVIDER_SITE_OTHER): Payer: Self-pay | Admitting: *Deleted

## 2013-09-20 MED ORDER — HYDROCODONE-ACETAMINOPHEN 5-325 MG PO TABS: 1.0000 | ORAL_TABLET | Freq: Four times a day (QID) | ORAL | Status: DC | PRN

## 2013-09-20 NOTE — Telephone Encounter (Signed)
Pt is s/p Laparoscopic LIH with open Umbilical Hernia on 7-74-14.  At that time he was given Oxy IR/Roxicodone 5 mg at time of surgery.  He said that wasn't strong enough.  His last f/u with Dr. Zella Richer was 08-29-13, he was given Hydrocodone 5-325.  Pt states that this is working better for him and he is asking for another refill.  I advised pt that Dr. Zella Richer was unavailable at this time and we would have to discuss with the [facility] office Dr this afternoon and see if he would approve it.  I advised pt someone would return his call once we have heard back from the Dr.  [name] verbalized understanding.  Anderson Malta

## 2013-09-20 NOTE — Telephone Encounter (Signed)
Spoke with pt and informed him that Dr. Donne Hazel agreed to refill the Rx request but will only give #10.  Explained that he will need to see Dr. Zella Richer again before any more pain medication is given.  Patient verbalized understanding and agreed to pick it up from our front desk.

## 2013-09-23 ENCOUNTER — Other Ambulatory Visit: Payer: Self-pay | Admitting: Internal Medicine

## 2013-09-25 ENCOUNTER — Other Ambulatory Visit: Payer: Self-pay | Admitting: *Deleted

## 2013-09-25 MED ORDER — PRAVASTATIN SODIUM 40 MG PO TABS: ORAL_TABLET | ORAL | Status: DC

## 2013-10-18 NOTE — Telephone Encounter (Signed)
Pt called in asking for refill of pain medication. He states he has tingling and pain on and off. Advised his surgery is far enough out from surgery and we don't give refills out this long. Let him know last note says he needs office visit before another refill will be given. He does not want to come in and pay a co pay so he will take tylenol or ibuprofen.

## 2013-11-07 ENCOUNTER — Ambulatory Visit: Payer: Self-pay | Admitting: Internal Medicine

## 2013-11-07 ENCOUNTER — Ambulatory Visit: Payer: Self-pay | Admitting: Physician Assistant

## 2013-12-11 ENCOUNTER — Other Ambulatory Visit: Payer: Self-pay | Admitting: *Deleted

## 2013-12-11 ENCOUNTER — Ambulatory Visit (INDEPENDENT_AMBULATORY_CARE_PROVIDER_SITE_OTHER): Payer: BC Managed Care – PPO | Admitting: Internal Medicine

## 2013-12-11 ENCOUNTER — Encounter: Payer: Self-pay | Admitting: Internal Medicine

## 2013-12-11 VITALS — BP 128/76 | HR 60 | Temp 98.2°F | Resp 18 | Ht 69.0 in | Wt 210.0 lb

## 2013-12-11 DIAGNOSIS — I10 Essential (primary) hypertension: Secondary | ICD-10-CM

## 2013-12-11 DIAGNOSIS — R7303 Prediabetes: Secondary | ICD-10-CM

## 2013-12-11 DIAGNOSIS — Z79899 Other long term (current) drug therapy: Secondary | ICD-10-CM

## 2013-12-11 DIAGNOSIS — E782 Mixed hyperlipidemia: Secondary | ICD-10-CM

## 2013-12-11 DIAGNOSIS — E559 Vitamin D deficiency, unspecified: Secondary | ICD-10-CM

## 2013-12-11 DIAGNOSIS — R7309 Other abnormal glucose: Secondary | ICD-10-CM

## 2013-12-11 LAB — CBC WITH DIFFERENTIAL/PLATELET
BASOS PCT: 1 % (ref 0–1)
Basophils Absolute: 0.1 10*3/uL (ref 0.0–0.1)
Eosinophils Absolute: 0.4 10*3/uL (ref 0.0–0.7)
Eosinophils Relative: 7 % — ABNORMAL HIGH (ref 0–5)
HCT: 42.3 % (ref 39.0–52.0)
Hemoglobin: 14.7 g/dL (ref 13.0–17.0)
Lymphocytes Relative: 45 % (ref 12–46)
Lymphs Abs: 2.5 10*3/uL (ref 0.7–4.0)
MCH: 31.1 pg (ref 26.0–34.0)
MCHC: 34.8 g/dL (ref 30.0–36.0)
MCV: 89.6 fL (ref 78.0–100.0)
Monocytes Absolute: 0.6 10*3/uL (ref 0.1–1.0)
Monocytes Relative: 11 % (ref 3–12)
NEUTROS PCT: 36 % — AB (ref 43–77)
Neutro Abs: 2 10*3/uL (ref 1.7–7.7)
PLATELETS: 148 10*3/uL — AB (ref 150–400)
RBC: 4.72 MIL/uL (ref 4.22–5.81)
RDW: 13.8 % (ref 11.5–15.5)
WBC: 5.6 10*3/uL (ref 4.0–10.5)

## 2013-12-11 MED ORDER — MELOXICAM 15 MG PO TABS: 15.0000 mg | ORAL_TABLET | Freq: Every day | ORAL | Status: DC

## 2013-12-11 NOTE — Progress Notes (Signed)
Patient ID: Caleb Johns, male   DOB: 1955-12-17, 58 y.o.   MRN: [id number]   This very nice 58 y.o.male presents for 3 month follow up with Hypertension, Hyperlipidemia, Pre-Diabetes and Vitamin D Deficiency.    Patient is treated for HTN & BP has been controlled at home. Today's BP: 128/76 mmHg. Patient has had no complaints of any cardiac type chest pain, palpitations, dyspnea/orthopnea/PND, dizziness, claudication, or dependent edema.   Hyperlipidemia is controlled with diet & meds. Patient denies myalgias or other med SE's. Last Lipids were at goal - Total Chol 166; HDL 38*; LDL 79; Triglycerides 244 on 08/03/2013.    Also, the patient has history of  PreDiabetes (A1c 6.2% in Dec 2012) which he manages with diet and has had no symptoms of reactive hypoglycemia, diabetic polys, paresthesias or visual blurring.  Last A1c was  5.9% on 08/03/2013.    Further, the patient also has history of Vitamin D Deficiency (21 in 2008) and supplements vitamin D without any suspected side-effects. Last vitamin D was  70 on 08/03/2013.   Medication List   acyclovir 800 MG tablet  Commonly known as:  ZOVIRAX  Take 1 tablet daily     allopurinol 300 MG tablet  Commonly known as:  ZYLOPRIM  Take 1 tablet (300 mg total) by mouth daily.     alum & mag hydroxide-simeth 200-200-20 MG/5ML suspension  Commonly known as:  MAALOX/MYLANTA  Take 30 mLs by mouth 2 (two) times daily.     aspirin EC 81 MG tablet  Take 81 mg by mouth daily.     atenolol 100 MG tablet  Commonly known as:  TENORMIN  Take 100 mg by mouth every morning.     Fish Oil 1000 MG Caps  Take 1,000 mg by mouth daily.     Flax Seed Oil 1000 MG Caps  Take 1,000 mg by mouth daily.     glucose blood test strip  Commonly known as:  FREESTYLE TEST STRIPS  Use as instructed, DX 250.00     hydrochlorothiazide 25 MG tablet  Commonly known as:  HYDRODIURIL  TAKE ONE TABLET BY MOUTH EVERY DAY     HYDROcodone-acetaminophen 5-325 MG per tablet   Commonly known as:  NORCO  Take 1-2 tablets by mouth every 6 (six) hours as needed.     meloxicam 15 MG tablet  Commonly known as:  MOBIC  Take 1 tablet (15 mg total) by mouth daily.     pravastatin 40 MG tablet  Commonly known as:  PRAVACHOL  Take 1 tab by mouth daily     ranitidine 300 MG tablet  Commonly known as:  ZANTAC  Take 300 mg by mouth daily as needed for heartburn.     sildenafil 100 MG tablet  Commonly known as:  VIAGRA  Take 50-100 mg by mouth daily as needed for erectile dysfunction.     SUPER B COMPLEX PO  Take 1 tablet by mouth daily.     tamsulosin 0.4 MG Caps capsule  Commonly known as:  FLOMAX  Take 1 capsule (0.4 mg total) by mouth daily.     tetrahydrozoline 0.05 % ophthalmic solution  Place 1 drop into both eyes as needed (Red eyes).     Vitamin D 2000 UNITS Caps  Take 2,000 Units by mouth daily.     Allergies  Allergen Reactions  . Ace Inhibitors     REACTION: cough  . Trilipix [Choline Fenofibrate] Other (See Comments)    Heart burn  PMHx:   Past Medical History  Diagnosis Date  . Hypertension   . Hyperlipidemia   . Obese   . Pre-diabetes   . Hypogonadism male   . Gout   . HSV-1 (herpes simplex virus 1) infection   . HSV-2 (herpes simplex virus 2) infection   . GERD (gastroesophageal reflux disease)   . Pneumonia     walking -many years ago   Immunization History  Administered Date(s) Administered  . Pneumococcal Polysaccharide-23 02/23/2002  . Tdap 05/15/2009   Past Surgical History  Procedure Laterality Date  . Back surgery  2012    on back-hernia  . Laparoscopic inguinal hernia with umbilical hernia Right 1/91/4782    Procedure: LAPAROSCOPIC RIGH  INGUINAL HERNIA WITH OPEN UMBILICAL HERNIA;  Surgeon: Odis Hollingshead, MD;  Location: [facility];  Service: General;  Laterality: Right;  umbilical repair with mesh  . Insertion of mesh N/A 08/15/2013    Procedure: INSERTION OF MESH;  Surgeon: Odis Hollingshead, MD;  Location: [facility];  Service: General;  Laterality: N/A;  . Hernia repair     FHx:    Reviewed / unchanged  SHx:    Reviewed / unchanged  Systems Review:  Constitutional: Denies fever, chills, wt changes, headaches, insomnia, fatigue, night sweats, change in appetite. Eyes: Denies redness, blurred vision, diplopia, discharge, itchy, watery eyes.  ENT: Denies discharge, congestion, post nasal drip, epistaxis, sore throat, earache, hearing loss, dental pain, tinnitus, vertigo, sinus pain, snoring.  CV: Denies chest pain, palpitations, irregular heartbeat, syncope, dyspnea, diaphoresis, orthopnea, PND, claudication or edema. Respiratory: denies cough, dyspnea, DOE, pleurisy, hoarseness, laryngitis, wheezing.  Gastrointestinal: Denies dysphagia, odynophagia, heartburn, reflux, water brash, abdominal pain or cramps, nausea, vomiting, bloating, diarrhea, constipation, hematemesis, melena, hematochezia  or hemorrhoids. Genitourinary: Denies dysuria, frequency, urgency, nocturia, hesitancy, discharge, hematuria or flank pain. Musculoskeletal: Denies arthralgias, myalgias, stiffness, jt. swelling, pain, limping or strain/sprain.  Skin: Denies pruritus, rash, hives, warts, acne, eczema or change in skin lesion(s). Neuro: No weakness, tremor, incoordination, spasms, paresthesia or pain. Psychiatric: Denies confusion, memory loss or sensory loss. Endo: Denies change in weight, skin or hair change.  Heme/Lymph: No excessive bleeding, bruising or enlarged lymph nodes.  Exam:  BP 128/76  Pulse 60  Temp 98.2 F   Resp 18  Ht 5\' 9"    Wt 210 lb   BMI 31.00   Appears well nourished and in no distress. Eyes: PERRLA, EOMs, conjunctiva no swelling or erythema. Sinuses: No frontal/maxillary tenderness ENT/Mouth: EAC's clear, TM's nl w/o erythema, bulging. Nares clear w/o erythema, swelling, exudates. Oropharynx clear without erythema or exudates. Oral hygiene is good. Tongue normal, non obstructing. Hearing intact.   Neck: Supple. Thyroid nl. Car 2+/2+ without bruits, nodes or JVD. Chest: Respirations nl with BS clear & equal w/o rales, rhonchi, wheezing or stridor.  Cor: Heart sounds normal w/ regular rate and rhythm without sig. murmurs, gallops, clicks, or rubs. Peripheral pulses normal and equal  without edema.  Abdomen: Soft & bowel sounds normal. Non-tender w/o guarding, rebound, hernias, masses, or organomegaly.  Lymphatics: Unremarkable.  Musculoskeletal: Full ROM all peripheral extremities, joint stability, 5/5 strength, and normal gait.  Skin: Warm, dry without exposed rashes, lesions or ecchymosis apparent.  Neuro: Cranial nerves intact, reflexes equal bilaterally. Sensory-motor testing grossly intact. Tendon reflexes grossly intact.  Pysch: Alert & oriented x 3.  Insight and judgement nl & appropriate. No ideations.  Assessment and Plan:  1. Hypertension - Continue monitor blood pressure at home. Continue diet/meds same.  2. Hyperlipidemia - Continue diet/meds, exercise,& lifestyle modifications. Continue monitor periodic cholesterol/liver & renal functions   3. Pre-Diabetes - Continue diet, exercise, lifestyle modifications. Monitor appropriate labs.  4. Vitamin D Deficiency - Continue supplementation.   Recommended regular exercise, BP monitoring, weight control, and discussed med and SE's. Recommended labs to assess and monitor clinical status. Further disposition pending results of labs.

## 2013-12-12 LAB — HEMOGLOBIN A1C
HEMOGLOBIN A1C: 5.6 % (ref <5.7)
Mean Plasma Glucose: 114 mg/dL (ref <117)

## 2013-12-12 LAB — LIPID PANEL
CHOL/HDL RATIO: 4.1 ratio
CHOLESTEROL: 139 mg/dL (ref 0–200)
HDL: 34 mg/dL — ABNORMAL LOW (ref >39)
LDL Cholesterol: 55 mg/dL (ref 0–99)
Triglycerides: 251 mg/dL — ABNORMAL HIGH (ref <150)
VLDL: 50 mg/dL — ABNORMAL HIGH (ref 0–40)

## 2013-12-12 LAB — BASIC METABOLIC PANEL WITH GFR
BUN: 14 mg/dL (ref 6–23)
CALCIUM: 9.9 mg/dL (ref 8.4–10.5)
CO2: 32 mEq/L (ref 19–32)
Chloride: 99 mEq/L (ref 96–112)
Creat: 0.89 mg/dL (ref 0.50–1.35)
GLUCOSE: 103 mg/dL — AB (ref 70–99)
Potassium: 3.6 mEq/L (ref 3.5–5.3)
SODIUM: 141 meq/L (ref 135–145)

## 2013-12-12 LAB — MAGNESIUM: MAGNESIUM: 1.7 mg/dL (ref 1.5–2.5)

## 2013-12-12 LAB — HEPATIC FUNCTION PANEL
ALT: 14 U/L (ref 0–53)
AST: 26 U/L (ref 0–37)
Albumin: 4.1 g/dL (ref 3.5–5.2)
Alkaline Phosphatase: 88 U/L (ref 39–117)
BILIRUBIN DIRECT: 0.1 mg/dL (ref 0.0–0.3)
BILIRUBIN TOTAL: 0.6 mg/dL (ref 0.2–1.2)
Indirect Bilirubin: 0.5 mg/dL (ref 0.2–1.2)
Total Protein: 6.2 g/dL (ref 6.0–8.3)

## 2013-12-12 LAB — INSULIN, FASTING: Insulin fasting, serum: 15.3 u[IU]/mL (ref 2.0–19.6)

## 2013-12-12 LAB — VITAMIN D 25 HYDROXY (VIT D DEFICIENCY, FRACTURES): Vit D, 25-Hydroxy: 67 ng/mL (ref 30–89)

## 2013-12-12 LAB — TSH: TSH: 1.085 u[IU]/mL (ref 0.350–4.500)

## 2013-12-31 ENCOUNTER — Other Ambulatory Visit: Payer: Self-pay | Admitting: Internal Medicine

## 2013-12-31 ENCOUNTER — Other Ambulatory Visit: Payer: Self-pay | Admitting: Physician Assistant

## 2014-01-22 ENCOUNTER — Other Ambulatory Visit: Payer: Self-pay | Admitting: Internal Medicine

## 2014-01-22 DIAGNOSIS — N528 Other male erectile dysfunction: Secondary | ICD-10-CM

## 2014-01-22 MED ORDER — TADALAFIL 20 MG PO TABS: ORAL_TABLET | ORAL | Status: DC

## 2014-02-08 ENCOUNTER — Encounter: Payer: Self-pay | Admitting: Internal Medicine

## 2014-02-13 ENCOUNTER — Ambulatory Visit (INDEPENDENT_AMBULATORY_CARE_PROVIDER_SITE_OTHER): Payer: BC Managed Care – PPO | Admitting: Internal Medicine

## 2014-02-13 ENCOUNTER — Encounter: Payer: Self-pay | Admitting: Internal Medicine

## 2014-02-13 VITALS — BP 136/92 | HR 64 | Temp 97.9°F | Resp 16 | Ht 68.75 in | Wt 215.4 lb

## 2014-02-13 DIAGNOSIS — R7309 Other abnormal glucose: Secondary | ICD-10-CM

## 2014-02-13 DIAGNOSIS — E559 Vitamin D deficiency, unspecified: Secondary | ICD-10-CM

## 2014-02-13 DIAGNOSIS — Z111 Encounter for screening for respiratory tuberculosis: Secondary | ICD-10-CM

## 2014-02-13 DIAGNOSIS — R7303 Prediabetes: Secondary | ICD-10-CM

## 2014-02-13 DIAGNOSIS — Z79899 Other long term (current) drug therapy: Secondary | ICD-10-CM

## 2014-02-13 DIAGNOSIS — E782 Mixed hyperlipidemia: Secondary | ICD-10-CM

## 2014-02-13 DIAGNOSIS — R5383 Other fatigue: Secondary | ICD-10-CM

## 2014-02-13 DIAGNOSIS — Z125 Encounter for screening for malignant neoplasm of prostate: Secondary | ICD-10-CM

## 2014-02-13 DIAGNOSIS — I10 Essential (primary) hypertension: Secondary | ICD-10-CM

## 2014-02-13 DIAGNOSIS — K219 Gastro-esophageal reflux disease without esophagitis: Secondary | ICD-10-CM

## 2014-02-13 DIAGNOSIS — E349 Endocrine disorder, unspecified: Secondary | ICD-10-CM

## 2014-02-13 DIAGNOSIS — E291 Testicular hypofunction: Secondary | ICD-10-CM

## 2014-02-13 DIAGNOSIS — Z1212 Encounter for screening for malignant neoplasm of rectum: Secondary | ICD-10-CM

## 2014-02-13 LAB — CBC WITH DIFFERENTIAL/PLATELET
BASOS PCT: 1 % (ref 0–1)
Basophils Absolute: 0.1 10*3/uL (ref 0.0–0.1)
Eosinophils Absolute: 0.2 10*3/uL (ref 0.0–0.7)
Eosinophils Relative: 3 % (ref 0–5)
HCT: 43.4 % (ref 39.0–52.0)
HEMOGLOBIN: 14.9 g/dL (ref 13.0–17.0)
LYMPHS ABS: 2.7 10*3/uL (ref 0.7–4.0)
Lymphocytes Relative: 50 % — ABNORMAL HIGH (ref 12–46)
MCH: 30.2 pg (ref 26.0–34.0)
MCHC: 34.3 g/dL (ref 30.0–36.0)
MCV: 87.9 fL (ref 78.0–100.0)
MPV: 12.1 fL (ref 9.4–12.4)
Monocytes Absolute: 0.6 10*3/uL (ref 0.1–1.0)
Monocytes Relative: 11 % (ref 3–12)
NEUTROS PCT: 35 % — AB (ref 43–77)
Neutro Abs: 1.9 10*3/uL (ref 1.7–7.7)
Platelets: 154 10*3/uL (ref 150–400)
RBC: 4.94 MIL/uL (ref 4.22–5.81)
RDW: 14.4 % (ref 11.5–15.5)
WBC: 5.4 10*3/uL (ref 4.0–10.5)

## 2014-02-13 NOTE — Progress Notes (Signed)
Patient ID: Caleb Johns, male   DOB: [birth year], 58 y.o.   MRN: [id number]  Annual Comprehensive Examination  This very nice 58 y.o.male presents for complete physical.  Patient has been followed for HTN, T2_NIDDM  Prediabetes, Hyperlipidemia, and Vitamin D Deficiency.   HTN predates since     . Patient's BP has been controlled at home.Today's BP was high at 136/92 mmHg. Patient denies any cardiac symptoms as chest pain, palpitations, shortness of breath, dizziness or ankle swelling.   Patient's hyperlipidemia is controlled with diet and medications. Patient denies myalgias or other medication SE's. Last lipids were at goal  - Total Chol 139; HDL  34*; LDL  55; with elevated Trig 251* on 12/11/2013.   Patient has prediabetes since Sept 2012 with A1c 6.0%  and patient denies reactive hypoglycemic symptoms, visual blurring, diabetic polys or paresthesias. Last A1c was  5.6% on  12/11/2013.   Finally, patient has history of Vitamin D Deficiency of 21 in 2008 and last vitamin D was  67 on  12/11/2013.  Medication Sig  . acyclovir (ZOVIRAX) 800 MG tablet Take 1 tablet daily  . allopurinol (ZYLOPRIM) 300 MG tablet TAKE ONE TABLET BY MOUTH ONCE DAILY FOR  GOUT  .  (MAALOX/MYLANTA Take 30 mLs by mouth 2 (two) times daily.  Marland Kitchen aspirin EC 81 MG tablet Take 81 mg by mouth daily.  Marland Kitchen atenolol  100 MG tablet Take 100 mg by mouth every morning.  . SUPER B COMPLEX Take 1 tablet by mouth daily.  Marland Kitchen VITAMIN D 2000 UNITS CAPS Take 2,000 Units by mouth daily.  Marland Kitchen FLAX SEED OIL 1000 MG CAPS Take 1,000 mg by mouth daily.  Marland Kitchen glucose blood (FREESTYLE TEST STRIPS) test strip Use as instructed, DX 250.00  . HYDROcodone-acetaminophen (NORCO) 5-325 MG per tablet Take 1-2 tablets by mouth every 6 (six) hours as needed.  . Lancets (FREESTYLE) lancets   . meloxicam (MOBIC) 15 MG tablet Take 1 tablet (15 mg total) by mouth daily.  . Omega-3 Fatty Acids (FISH OIL) 1000 MG CAPS Take 1,000 mg by mouth daily.  . pravastatin  (PRAVACHOL) 40 MG tablet Take 1 tab by mouth daily  . ranitidine (ZANTAC) 300 MG tablet Take 300 mg by mouth daily as needed for heartburn.  . tadalafil (CIALIS) 20 MG tablet Take 1/2 to 1 tablet every  2 to 3 tablets as needed for XXXX  . tamsulosin (FLOMAX) 0.4 MG CAPS capsule Take 1 capsule (0.4 mg total) by mouth daily.  Marland Kitchen tetrahydrozoline 0.05 % ophthalmic solution Place 1 drop into both eyes as needed (Red eyes).   Allergies  Allergen Reactions  . Ace Inhibitors     REACTION: cough  . Trilipix [Choline Fenofibrate] Other (See Comments)    Heart burn   Past Medical History  Diagnosis Date  . Hypertension   . Hyperlipidemia   . Obese   . Pre-diabetes   . Hypogonadism male   . Gout   . HSV-1 (herpes simplex virus 1) infection   . HSV-2 (herpes simplex virus 2) infection   . GERD (gastroesophageal reflux disease)   . Pneumonia     walking -many years ago   Health Maintenance  Topic Date Due  . COLONOSCOPY  03/16/2006  . INFLUENZA VACCINE  09/23/2013  . TETANUS/TDAP  05/16/2019   Immunization History  Administered Date(s) Administered  . Pneumococcal Polysaccharide-23 02/23/2002  . Tdap 05/15/2009   Past Surgical History  Procedure Laterality Date  . Back surgery  2012  on back-hernia  . Laparoscopic inguinal hernia with umbilical hernia Right 1/61/0960    Procedure: LAPAROSCOPIC RIGH  INGUINAL HERNIA WITH OPEN UMBILICAL HERNIA;  Surgeon: Odis Hollingshead, MD;  Location: [facility];  Service: General;  Laterality: Right;  umbilical repair with mesh  . Insertion of mesh N/A 08/15/2013    Procedure: INSERTION OF MESH;  Surgeon: Odis Hollingshead, MD;  Location: [facility];  Service: General;  Laterality: N/A;  . Hernia repair     Family History  Problem Relation Age of Onset  . Diabetes Mother   . Hypertension Mother   . Hypertension Brother   . Hypertension Brother   . Hypertension Brother    History   Social History  . Marital Status: Single    Spouse Name: N/A     Number of Children: N/A  . Years of Education: N/A   Occupational History  . Construction Atty   Social History Main Topics  . Smoking status: Former Smoker    Quit date: 02/23/2002  . Smokeless tobacco: Not on file  . Alcohol Use: No  . Drug Use: No  . Sexual Activity: Active    ROS Constitutional: Denies fever, chills, weight loss/gain, headaches, insomnia, fatigue, night sweats or change in appetite. Eyes: Denies redness, blurred vision, diplopia, discharge, itchy or watery eyes.  ENT: Denies discharge, congestion, post nasal drip, epistaxis, sore throat, earache, hearing loss, dental pain, Tinnitus, Vertigo, Sinus pain or snoring.  Cardio: Denies chest pain, palpitations, irregular heartbeat, syncope, dyspnea, diaphoresis, orthopnea, PND, claudication or edema Respiratory: denies cough, dyspnea, DOE, pleurisy, hoarseness, laryngitis or wheezing.  Gastrointestinal: Denies dysphagia, heartburn, reflux, water brash, pain, cramps, nausea, vomiting, bloating, diarrhea, constipation, hematemesis, melena, hematochezia, jaundice or hemorrhoids Genitourinary: Denies dysuria, frequency, urgency, nocturia, hesitancy, discharge, hematuria or flank pain Musculoskeletal: Denies arthralgia, myalgia, stiffness, Jt. Swelling, pain, limp or strain/sprain. Denies Falls. Skin: Denies puritis, rash, hives, warts, acne, eczema or change in skin lesion Neuro: No weakness, tremor, incoordination, spasms, paresthesia or pain Psychiatric: Denies confusion, memory loss or sensory loss. Denies Depression. Endocrine: Denies change in weight, skin, hair change, nocturia, and paresthesia, diabetic polys, visual blurring or hyper / hypo glycemic episodes.  Heme/Lymph: No excessive bleeding, bruising or enlarged lymph nodes.  Physical Exam  BP 136/92   Pulse 64  Temp 97.9 F   Resp 16  Ht 5' 8.75"   Wt 215 lb 6.4 oz  BMI 32.05   General Appearance: Well nourished, in no apparent distress. Eyes:  PERRLA, EOMs, conjunctiva no swelling or erythema, normal fundi and vessels. Sinuses: No frontal/maxillary tenderness ENT/Mouth: EACs patent / TMs  nl. Nares clear without erythema, swelling, mucoid exudates. Oral hygiene is good. No erythema, swelling, or exudate. Tongue normal, non-obstructing. Tonsils not swollen or erythematous. Hearing normal.  Neck: Supple, thyroid normal. No bruits, nodes or JVD. Respiratory: Respiratory effort normal.  BS equal and clear bilateral without rales, rhonci, wheezing or stridor. Cardio: Heart sounds are normal with regular rate and rhythm and no murmurs, rubs or gallops. Peripheral pulses are normal and equal bilaterally without edema. No aortic or femoral bruits. Chest: symmetric with normal excursions and percussion.  Abdomen: Flat, soft, with bowl sounds. Nontender, no guarding, rebound, hernias, masses, or organomegaly.  Lymphatics: Non tender without lymphadenopathy.  Genitourinary: No hernias.Testes nl. DRE - prostate nl for age - smooth & firm w/o nodules. Musculoskeletal: Full ROM all peripheral extremities, joint stability, 5/5 strength, and normal gait. Skin: Warm and dry without rashes, lesions, cyanosis, clubbing or  ecchymosis.  Neuro: Cranial nerves intact, reflexes equal bilaterally. Normal muscle tone, no cerebellar symptoms. Sensation intact.  Pysch: Awake and oriented X 3 with normal affect, insight and judgment appropriate.   Assessment and Plan  1. Essential hypertension  - Microalbumin / creatinine urine ratio - EKG 12-Lead - [place], RETROPERITNL ABD,  LTD - TSH  2. Hyperlipidemia  - Lipid panel  3. Prediabetes  - Hemoglobin A1c - Insulin, fasting  4. Vitamin D deficiency  - Vit D level  5. Testosterone deficiency  - Testosterone  6. Gastroesophageal reflux disease   7. Medication management  - Urine Microscopic - CBC with Differential - BASIC METABOLIC PANEL WITH GFR - Hepatic function panel - Magnesium  8.  Screening for rectal cancer  - POC Hemoccult Bld/Stl (3-Cd Home Screen); Future  9. Prostate cancer screening  - PSA  10. Other fatigue  - Vitamin B12 - Iron and TIBC   Continue prudent diet as discussed, weight control, BP monitoring, regular exercise, and medications as discussed.  Discussed med effects and SE's. Routine screening labs and tests as requested with regular follow-up as recommended.

## 2014-02-13 NOTE — Patient Instructions (Signed)

## 2014-02-14 LAB — HEPATIC FUNCTION PANEL
ALT: 14 U/L (ref 0–53)
AST: 23 U/L (ref 0–37)
Albumin: 4.3 g/dL (ref 3.5–5.2)
Alkaline Phosphatase: 78 U/L (ref 39–117)
BILIRUBIN DIRECT: 0.1 mg/dL (ref 0.0–0.3)
BILIRUBIN TOTAL: 0.5 mg/dL (ref 0.2–1.2)
Indirect Bilirubin: 0.4 mg/dL (ref 0.2–1.2)
Total Protein: 6.4 g/dL (ref 6.0–8.3)

## 2014-02-14 LAB — URINALYSIS, MICROSCOPIC ONLY
Bacteria, UA: NONE SEEN
Casts: NONE SEEN
Crystals: NONE SEEN
Squamous Epithelial / LPF: NONE SEEN

## 2014-02-14 LAB — BASIC METABOLIC PANEL WITH GFR
BUN: 12 mg/dL (ref 6–23)
CALCIUM: 9.7 mg/dL (ref 8.4–10.5)
CO2: 29 meq/L (ref 19–32)
Chloride: 101 mEq/L (ref 96–112)
Creat: 1.11 mg/dL (ref 0.50–1.35)
GFR, EST AFRICAN AMERICAN: 85 mL/min
GFR, Est Non African American: 73 mL/min
Glucose, Bld: 88 mg/dL (ref 70–99)
Potassium: 3.7 mEq/L (ref 3.5–5.3)
Sodium: 137 mEq/L (ref 135–145)

## 2014-02-14 LAB — PSA: PSA: 1.8 ng/mL (ref <=4.00)

## 2014-02-14 LAB — IRON AND TIBC
%SAT: 26 % (ref 20–55)
Iron: 78 ug/dL (ref 42–165)
TIBC: 301 ug/dL (ref 215–435)
UIBC: 223 ug/dL (ref 125–400)

## 2014-02-14 LAB — HEMOGLOBIN A1C
Hgb A1c MFr Bld: 6 % — ABNORMAL HIGH (ref <5.7)
Mean Plasma Glucose: 126 mg/dL — ABNORMAL HIGH (ref <117)

## 2014-02-14 LAB — TESTOSTERONE: TESTOSTERONE: 339 ng/dL (ref 300–890)

## 2014-02-14 LAB — LIPID PANEL
Cholesterol: 142 mg/dL (ref 0–200)
HDL: 31 mg/dL — AB (ref >39)
LDL Cholesterol: 60 mg/dL (ref 0–99)
TRIGLYCERIDES: 256 mg/dL — AB (ref <150)
Total CHOL/HDL Ratio: 4.6 Ratio
VLDL: 51 mg/dL — ABNORMAL HIGH (ref 0–40)

## 2014-02-14 LAB — MICROALBUMIN / CREATININE URINE RATIO: CREATININE, URINE: 24.4 mg/dL

## 2014-02-14 LAB — INSULIN, FASTING: Insulin fasting, serum: 13.6 u[IU]/mL (ref 2.0–19.6)

## 2014-02-14 LAB — VITAMIN D 25 HYDROXY (VIT D DEFICIENCY, FRACTURES): VIT D 25 HYDROXY: 43 ng/mL (ref 30–100)

## 2014-02-14 LAB — MAGNESIUM: MAGNESIUM: 1.7 mg/dL (ref 1.5–2.5)

## 2014-02-14 LAB — TSH: TSH: 1.723 u[IU]/mL (ref 0.350–4.500)

## 2014-02-14 LAB — VITAMIN B12: VITAMIN B 12: 819 pg/mL (ref 211–911)

## 2014-03-08 ENCOUNTER — Encounter: Payer: Self-pay | Admitting: Internal Medicine

## 2014-03-08 ENCOUNTER — Ambulatory Visit (INDEPENDENT_AMBULATORY_CARE_PROVIDER_SITE_OTHER): Payer: BLUE CROSS/BLUE SHIELD | Admitting: Internal Medicine

## 2014-03-08 VITALS — BP 112/80 | HR 60 | Temp 99.5°F | Resp 16 | Ht 68.25 in | Wt 217.8 lb

## 2014-03-08 DIAGNOSIS — J0100 Acute maxillary sinusitis, unspecified: Secondary | ICD-10-CM

## 2014-03-08 DIAGNOSIS — J029 Acute pharyngitis, unspecified: Secondary | ICD-10-CM

## 2014-03-08 DIAGNOSIS — J0410 Acute tracheitis without obstruction: Secondary | ICD-10-CM

## 2014-03-08 MED ORDER — AZITHROMYCIN 250 MG PO TABS: ORAL_TABLET | ORAL | Status: DC

## 2014-03-08 MED ORDER — PREDNISONE 20 MG PO TABS: ORAL_TABLET | ORAL | Status: DC

## 2014-03-08 MED ORDER — HYDROCODONE-ACETAMINOPHEN 5-325 MG PO TABS: ORAL_TABLET | ORAL | Status: AC

## 2014-03-10 NOTE — Progress Notes (Signed)
Subjective:    Patient ID: Caleb Johns, male    DOB: [birth year], 59 y.o.   MRN: [id number]  HPI  Several day hx/o head/chest congestion, runny nose , sore "itchy" throat , sneezing. Sputum is yellowish green. No fever chills rigors.   Medication Sig  . acyclovir (ZOVIRAX) 800 MG tablet Take 1 tablet daily  . allopurinol (ZYLOPRIM) 300 MG tablet TAKE ONE TABLET BY MOUTH ONCE DAILY FOR  GOUT  . alum & mag hydroxide-simeth (MAALOX/MYLANTA) 200-200-20 MG/5ML suspension Take 30 mLs by mouth 2 (two) times daily.  Marland Kitchen aspirin EC 81 MG tablet Take 81 mg by mouth daily.  Marland Kitchen atenolol (TENORMIN) 100 MG tablet Take 100 mg by mouth every morning.  . B Complex-C (SUPER B COMPLEX PO) Take 1 tablet by mouth daily.  . Cholecalciferol (VITAMIN D) 2000 UNITS CAPS Take 2,000 Units by mouth daily.  . Flaxseed, Linseed, (FLAX SEED OIL) 1000 MG CAPS Take 1,000 mg by mouth daily.  Marland Kitchen glucose blood (FREESTYLE TEST STRIPS) test strip Use as instructed, DX 250.00  . Lancets (FREESTYLE) lancets   . meloxicam (MOBIC) 15 MG tablet Take 1 tablet (15 mg total) by mouth daily.  . Omega-3 Fatty Acids (FISH OIL) 1000 MG CAPS Take 1,000 mg by mouth daily.  . pravastatin (PRAVACHOL) 40 MG tablet Take 1 tab by mouth daily  . ranitidine (ZANTAC) 300 MG tablet Take 300 mg by mouth daily as needed for heartburn.  . tadalafil (CIALIS) 20 MG tablet Take 1/2 to 1 tablet every  2 to 3 tablets as needed for XXXX  . tamsulosin (FLOMAX) 0.4 MG CAPS capsule Take 1 capsule (0.4 mg total) by mouth daily.  Marland Kitchen HYDROcodone-acetaminophen (NORCO) 5-325 MG per tablet Take 1-2 tablets by mouth every 6 (six) hours as needed.  Marland Kitchen tetrahydrozoline 0.05 % ophthalmic solution Place 1 drop into both eyes as needed (Red eyes).    Allergies  Allergen Reactions  . Ace Inhibitors     REACTION: cough  . Trilipix [Choline Fenofibrate] Other (See Comments)    Heart burn    Past Medical History  Diagnosis Date  . Hypertension   . Hyperlipidemia   .  Obese   . Pre-diabetes   . Hypogonadism male   . Gout   . HSV-1 (herpes simplex virus 1) infection   . HSV-2 (herpes simplex virus 2) infection   . GERD (gastroesophageal reflux disease)   . Pneumonia     walking -many years ago   Review of Systems Neg & non contributory to above with neg 10 point systems review.    Objective:   Physical Exam BP 112/80   Pulse 60  Temp  99.5 F   Resp 16  Ht 5' 8.25"   Wt 217 lb 12.8 oz    BMI  32.86   HEENT - Eac's patent. TM's Nl. EOM's full. PERRLA. NasoOroPharynx 2+ red w/o exudate. (+) tender frontal/maxillary areas. Neck - supple. Nl Thyroid. Carotids 2+ & No bruits, nodes, JVD Chest - Scattered bilateral rales, but no rhonchi, wheezes. Cor - Nl HS. RRR w/o sig MGR. PP 1(+). No edema. Abd - No palpable organomegaly, masses or tenderness. BS nl. MS- FROM w/o deformities. Muscle power, tone and bulk Nl. Gait Nl. Neuro - No obvious Cr N abnormalities. Sensory, motor and Cerebellar functions appear Nl w/o focal abnormalities. Psyche - Mental status normal & appropriate.  No delusions, ideations or obvious mood abnormalities.    Assessment & Plan:   1. Acute  maxillary sinusitis, recurrence not specified  2. Acute pharyngitis, unspecified pharyngitis type  3. Tracheitis  - azithromycin (ZITHROMAX) 250 MG tablet; Take 2 tablets  on  Day 1,  followed by 1 tablet once daily on Days 2 through 5.  Refill: 1 - Rx _Prednisone 20 mg #20 - taper - HYDROcodone-acetaminophen ([place]) ; Take 1/2 to 1 tablet every 3 to 4 hours if needed for cough or pain  #50 tablet - predniSONE (DELTASONE) 20 MG #20; 1 tab 3 x day for 3 days, then 1 tab 2 x day for 3 days, then 1 tab 1 x day for 5 days

## 2014-03-15 ENCOUNTER — Other Ambulatory Visit: Payer: Self-pay | Admitting: Internal Medicine

## 2014-04-25 ENCOUNTER — Ambulatory Visit (INDEPENDENT_AMBULATORY_CARE_PROVIDER_SITE_OTHER): Payer: BLUE CROSS/BLUE SHIELD | Admitting: Internal Medicine

## 2014-04-25 ENCOUNTER — Encounter: Payer: Self-pay | Admitting: Internal Medicine

## 2014-04-25 VITALS — BP 114/82 | HR 64 | Temp 97.0°F | Resp 16 | Ht 68.25 in | Wt 210.7 lb

## 2014-04-25 DIAGNOSIS — L309 Dermatitis, unspecified: Secondary | ICD-10-CM

## 2014-04-25 MED ORDER — TRIAMCINOLONE ACETONIDE 0.5 % EX CREA: 1.0000 "application " | TOPICAL_CREAM | Freq: Three times a day (TID) | CUTANEOUS | Status: DC

## 2014-04-25 NOTE — Progress Notes (Signed)
   Subjective:    Patient ID: Caleb Johns, male    DOB: September 09, 1955, 59 y.o.   MRN: [id number]  HPI Patient presents with c/o rash of bilateral palms of hands  Medication Sig  . acyclovir (ZOVIRAX) 800 MG tablet Take 1 tablet daily  . allopurinol (ZYLOPRIM) 300 MG tablet TAKE ONE TABLET BY MOUTH ONCE DAILY FOR  GOUT  . aspirin EC 81 MG tablet Take 81 mg by mouth daily.  Marland Kitchen atenolol (TENORMIN) 100 MG tablet TAKE ONE TABLET BY MOUTH ONCE DAILY  . B Complex-C (SUPER B COMPLEX PO) Take 1 tablet by mouth daily.  . Cholecalciferol (VITAMIN D) 2000 UNITS CAPS Take 2,000 Units by mouth daily.  Marland Kitchen FLAX SEED OIL 1000 MG CAPS Take 1,000 mg by mouth daily.  . meloxicam  15 MG tablet Take 1 tablet (15 mg total) by mouth daily.  Marland Kitchen FISH OIL 1000 MG CAPS Take 1,000 mg by mouth daily.  . pravastatin (PRAVACHOL) 40 MG tablet Take 1 tab by mouth daily  . ranitidine (ZANTAC) 300 MG tablet Take 300 mg by mouth daily as needed for heartburn.  . tadalafil (CIALIS) 20 MG tablet Take 1/2 to 1 tablet every  2 to 3 tablets as needed for XXXX  . tamsulosin (FLOMAX) 0.4 MG CAPS capsule Take 1 capsule (0.4 mg total) by mouth daily.  Marland Kitchen atenolol (TENORMIN) 100 MG tablet Take 100 mg by mouth every morning.   Allergies  Allergen Reactions  . Ace Inhibitors     REACTION: cough  . Trilipix [Choline Fenofibrate] Other (See Comments)    Heart burn   Past Medical History  Diagnosis Date  . Hypertension   . Hyperlipidemia   . Obese   . Pre-diabetes   . Hypogonadism male   . Gout   . HSV-1 (herpes simplex virus 1) infection   . HSV-2 (herpes simplex virus 2) infection   . GERD (gastroesophageal reflux disease)   . Pneumonia     walking -many years ago   Past Surgical History  Procedure Laterality Date  . Back surgery  2012    on back-hernia  . Laparoscopic inguinal hernia with umbilical hernia Right 5/57/3220    Procedure: LAPAROSCOPIC RIGH  INGUINAL HERNIA WITH OPEN UMBILICAL HERNIA;  Surgeon: Odis Hollingshead, MD;  Location: [facility];  Service: General;  Laterality: Right;  umbilical repair with mesh  . Insertion of mesh N/A 08/15/2013    Procedure: INSERTION OF MESH;  Surgeon: Odis Hollingshead, MD;  Location: [facility];  Service: General;  Laterality: N/A;  . Hernia repair     Review of Systems  10 point systems review negative except as above.    Objective:   Physical Exam  BP 114/82 mmHg  Pulse 64  Temp(Src) 97 F (36.1 C)  Resp 16  Ht 5' 8.25" (1.734 m)  Wt 210 lb 11.2 oz (95.573 kg)  BMI 31.79 kg/m2  Patient has classic eczematoid type rash of palms of both hands and volar inngers w/o signs of infection. No vesicles/bullae or ulcerations.     Assessment & Plan:   1. Eczema of both hands  - discussed avoidance of strong soaps, chemicals, fragrances  - triamcinolone cream (KENALOG) 0.5 %; Apply 1 application topically 3 (three) times daily.  Dispense: 30 g; Refill: 0

## 2014-05-27 ENCOUNTER — Other Ambulatory Visit: Payer: Self-pay | Admitting: Internal Medicine

## 2014-05-28 ENCOUNTER — Ambulatory Visit (INDEPENDENT_AMBULATORY_CARE_PROVIDER_SITE_OTHER): Payer: BLUE CROSS/BLUE SHIELD | Admitting: Physician Assistant

## 2014-05-28 ENCOUNTER — Encounter: Payer: Self-pay | Admitting: Physician Assistant

## 2014-05-28 VITALS — BP 120/72 | HR 54 | Temp 97.7°F | Resp 14 | Wt 211.0 lb

## 2014-05-28 DIAGNOSIS — E559 Vitamin D deficiency, unspecified: Secondary | ICD-10-CM

## 2014-05-28 DIAGNOSIS — R7309 Other abnormal glucose: Secondary | ICD-10-CM

## 2014-05-28 DIAGNOSIS — E663 Overweight: Secondary | ICD-10-CM | POA: Insufficient documentation

## 2014-05-28 DIAGNOSIS — M109 Gout, unspecified: Secondary | ICD-10-CM | POA: Insufficient documentation

## 2014-05-28 DIAGNOSIS — E669 Obesity, unspecified: Secondary | ICD-10-CM

## 2014-05-28 DIAGNOSIS — R7303 Prediabetes: Secondary | ICD-10-CM

## 2014-05-28 DIAGNOSIS — Z79899 Other long term (current) drug therapy: Secondary | ICD-10-CM

## 2014-05-28 DIAGNOSIS — I10 Essential (primary) hypertension: Secondary | ICD-10-CM

## 2014-05-28 DIAGNOSIS — E782 Mixed hyperlipidemia: Secondary | ICD-10-CM

## 2014-05-28 LAB — CBC WITH DIFFERENTIAL/PLATELET
BASOS PCT: 1 % (ref 0–1)
Basophils Absolute: 0.1 10*3/uL (ref 0.0–0.1)
EOS ABS: 0.3 10*3/uL (ref 0.0–0.7)
Eosinophils Relative: 5 % (ref 0–5)
HCT: 42.3 % (ref 39.0–52.0)
Hemoglobin: 14.6 g/dL (ref 13.0–17.0)
Lymphocytes Relative: 53 % — ABNORMAL HIGH (ref 12–46)
Lymphs Abs: 2.8 10*3/uL (ref 0.7–4.0)
MCH: 31.1 pg (ref 26.0–34.0)
MCHC: 34.5 g/dL (ref 30.0–36.0)
MCV: 90.2 fL (ref 78.0–100.0)
MONOS PCT: 10 % (ref 3–12)
MPV: 11.4 fL (ref 8.6–12.4)
Monocytes Absolute: 0.5 10*3/uL (ref 0.1–1.0)
NEUTROS PCT: 31 % — AB (ref 43–77)
Neutro Abs: 1.6 10*3/uL — ABNORMAL LOW (ref 1.7–7.7)
PLATELETS: 158 10*3/uL (ref 150–400)
RBC: 4.69 MIL/uL (ref 4.22–5.81)
RDW: 14.3 % (ref 11.5–15.5)
WBC: 5.2 10*3/uL (ref 4.0–10.5)

## 2014-05-28 NOTE — Progress Notes (Signed)
Assessment and Plan:  1. Hypertension -Continue medication but can cut atenolol in half due to weight loss, dizziness and bradycardia, monitor blood pressure at home. Continue DASH diet.  Reminder to go to the ER if any CP, SOB, nausea, dizziness, severe HA, changes vision/speech, left arm numbness and tingling and jaw pain.  2. Cholesterol -Continue diet and exercise. Check cholesterol.   3. Prediabetes  -Continue diet and exercise. Check A1C  4. Vitamin D Def - check level and continue medications.   5. Obesity with co morbidities - long discussion about weight loss, diet, and exercise. Stop sweet tea and gatorade.    Continue diet and meds as discussed. Further disposition pending results of labs. Over 30 minutes of exam, counseling, chart review, and critical decision making was performed  HPI 59 y.o. male  presents for 3 month follow up on hypertension, cholesterol, prediabetes, and vitamin D deficiency.   His blood pressure has been controlled at home, today their BP is BP: 120/72 mmHg  He does workout, he has been walking with the nice weaither. He denies chest pain, shortness of breath. He has had some dizziness with standing and heart rate has been in 50's.   He is on cholesterol medication, pravastatin 40 1/2 pill M,W,F and denies myalgias. His cholesterol is at goal. The cholesterol last visit was:   Lab Results  Component Value Date   CHOL 142 02/13/2014   HDL 31* 02/13/2014   LDLCALC 60 02/13/2014   TRIG 256* 02/13/2014   CHOLHDL 4.6 02/13/2014   He has been working on diet and exercise for prediabetes, and denies paresthesia of the feet, polydipsia, polyuria and visual disturbances. Last A1C in the office was:  Lab Results  Component Value Date   HGBA1C 6.0* 02/13/2014  Patient is on Vitamin D supplement.   Lab Results  Component Value Date   VD25OH 43 02/13/2014    BMI is Body mass index is 31.83 kg/(m^2)., he is working on diet and exercise. Wt Readings from  Last 3 Encounters:  05/28/14 211 lb (95.709 kg)  04/25/14 210 lb 11.2 oz (95.573 kg)  03/08/14 217 lb 12.8 oz (98.793 kg)    Current Medications:  Current Outpatient Prescriptions on File Prior to Visit  Medication Sig Dispense Refill  . acyclovir (ZOVIRAX) 800 MG tablet Take 1 tablet daily 30 tablet 1  . allopurinol (ZYLOPRIM) 300 MG tablet TAKE ONE TABLET BY MOUTH ONCE DAILY FOR  GOUT 90 tablet 1  . alum & mag hydroxide-simeth (MAALOX/MYLANTA) 200-200-20 MG/5ML suspension Take 30 mLs by mouth 2 (two) times daily.    Marland Kitchen aspirin EC 81 MG tablet Take 81 mg by mouth daily.    Marland Kitchen atenolol (TENORMIN) 100 MG tablet TAKE ONE TABLET BY MOUTH ONCE DAILY 90 tablet 3  . B Complex-C (SUPER B COMPLEX PO) Take 1 tablet by mouth daily.    . Cholecalciferol (VITAMIN D) 2000 UNITS CAPS Take 2,000 Units by mouth daily.    . Flaxseed, Linseed, (FLAX SEED OIL) 1000 MG CAPS Take 1,000 mg by mouth daily.    Marland Kitchen glucose blood (FREESTYLE TEST STRIPS) test strip Use as instructed, DX 250.00 100 each 12  . Lancets (FREESTYLE) lancets   12  . meloxicam (MOBIC) 15 MG tablet Take 1 tablet (15 mg total) by mouth daily. 90 tablet 4  . Omega-3 Fatty Acids (FISH OIL) 1000 MG CAPS Take 1,000 mg by mouth daily.    . pravastatin (PRAVACHOL) 40 MG tablet Take 1 tab by  mouth daily 90 tablet 1  . ranitidine (ZANTAC) 300 MG tablet Take 300 mg by mouth daily as needed for heartburn.    . tadalafil (CIALIS) 20 MG tablet Take 1/2 to 1 tablet every  2 to 3 tablets as needed for XXXX 100 tablet 99  . tamsulosin (FLOMAX) 0.4 MG CAPS capsule Take 1 capsule (0.4 mg total) by mouth daily. 7 capsule 0  . triamcinolone cream (KENALOG) 0.1 % APPLY ONE APPLICATION TO RASH ONCE DAILY AS NEEDED. 45 g 3   No current facility-administered medications on file prior to visit.   Medical History:  Past Medical History  Diagnosis Date  . Hypertension   . Hyperlipidemia   . Obese   . Pre-diabetes   . Hypogonadism male   . Gout   . HSV-1  (herpes simplex virus 1) infection   . HSV-2 (herpes simplex virus 2) infection   . GERD (gastroesophageal reflux disease)   . Pneumonia     walking -many years ago   Allergies:  Allergies  Allergen Reactions  . Ace Inhibitors     REACTION: cough  . Trilipix [Choline Fenofibrate] Other (See Comments)    Heart burn     Review of Systems:  Review of Systems  Constitutional: Negative.   HENT: Negative.   Eyes: Negative.   Respiratory: Negative.   Cardiovascular: Negative.   Gastrointestinal: Negative.   Genitourinary: Negative.   Musculoskeletal: Negative.   Skin: Positive for rash. Negative for itching.  Neurological: Positive for dizziness. Negative for tingling, tremors, sensory change, speech change, focal weakness, seizures and loss of consciousness.  Endo/Heme/Allergies: Negative.   Psychiatric/Behavioral: Negative.     Family history- Review and unchanged Social history- Review and unchanged Physical Exam: BP 120/72 mmHg  Pulse 54  Temp(Src) 97.7 F (36.5 C) (Temporal)  Resp 14  Wt 211 lb (95.709 kg) Wt Readings from Last 3 Encounters:  05/28/14 211 lb (95.709 kg)  04/25/14 210 lb 11.2 oz (95.573 kg)  03/08/14 217 lb 12.8 oz (98.793 kg)   General Appearance: Well nourished, in no apparent distress. Eyes: PERRLA, EOMs, conjunctiva no swelling or erythema Sinuses: No Frontal/maxillary tenderness ENT/Mouth: Ext aud canals clear, TMs without erythema, bulging. No erythema, swelling, or exudate on post pharynx.  Tonsils not swollen or erythematous. Hearing normal.  Neck: Supple, thyroid normal.  Respiratory: Respiratory effort normal, BS equal bilaterally without rales, rhonchi, wheezing or stridor.  Cardio: RR, bradycardia with no MRGs. Brisk peripheral pulses without edema.  Abdomen: Soft, + BS,  Non tender, no guarding, rebound, hernias, masses. Lymphatics: Non tender without lymphadenopathy.  Musculoskeletal: Full ROM, 5/5 strength, Normal gait Skin: Warm,  dry without rashes, lesions, ecchymosis.  Neuro: Cranial nerves intact. Normal muscle tone, no cerebellar symptoms. Psych: Awake and oriented X 3, normal affect, Insight and Judgment appropriate.    Vicie Mutters, PA-C 4:54 PM [facility]

## 2014-05-28 NOTE — Patient Instructions (Addendum)
Cut atenolol in half    Bad carbs also include fruit juice, alcohol, and sweet tea. These are empty calories that do not signal to your brain that you are full.   Please remember the good carbs are still carbs which convert into sugar. So please measure them out no more than 1/2-1 cup of rice, oatmeal, pasta, and beans  Veggies are however free foods! Pile them on.   Not all fruit is created equal. Please see the list below, the fruit at the bottom is higher in sugars than the fruit at the top. Please avoid all dried fruits.     Before you even begin to attack a weight-loss plan, it pays to remember this: You are not fat. You have fat. Losing weight isn't about blame or shame; it's simply another achievement to accomplish. Dieting is like any other skill-you have to buckle down and work at it. As long as you act in a smart, reasonable way, you'll ultimately get where you want to be. Here are some weight loss pearls for you.  1. It's Not a Diet. It's a Lifestyle Thinking of a diet as something you're on and suffering through only for the short term doesn't work. To shed weight and keep it off, you need to make permanent changes to the way you eat. It's OK to indulge occasionally, of course, but if you cut calories temporarily and then revert to your old way of eating, you'll gain back the weight quicker than you can say yo-yo. Use it to lose it. Research shows that one of the best predictors of long-term weight loss is how many pounds you drop in the first month. For that reason, nutritionists often suggest being stricter for the first two weeks of your new eating strategy to build momentum. Cut out added sugar and alcohol and avoid unrefined carbs. After that, figure out how you can reincorporate them in a way that's healthy and maintainable.  2. There's a Right Way to Exercise Working out burns calories and fat and boosts your metabolism by building muscle. But those trying to lose weight are  notorious for overestimating the number of calories they burn and underestimating the amount they take in. Unfortunately, your system is biologically programmed to hold on to extra pounds and that means when you start exercising, your body senses the deficit and ramps up its hunger signals. If you're not diligent, you'll eat everything you burn and then some. Use it to lose it. Cardio gets all the exercise glory, but strength and interval training are the real heroes. They help you build lean muscle, which in turn increases your metabolism and calorie-burning ability 3. Don't Overreact to Mild Hunger Some people have a hard time losing weight because of hunger anxiety. To them, being hungry is bad-something to be avoided at all costs-so they carry snacks with them and eat when they don't need to. Others eat because they're stressed out or bored. While you never want to get to the point of being ravenous (that's when bingeing is likely to happen), a hunger pang, a craving, or the fact that it's 3:00 p.m. should not send you racing for the vending machine or obsessing about the energy bar in your purse. Ideally, you should put off eating until your stomach is growling and it's difficult to concentrate.  Use it to lose it. When you feel the urge to eat, use the HALT method. Ask yourself, Am I really hungry? Or am I angry or anxious,  lonely or bored, or tired? If you're still not certain, try the apple test. If you're truly hungry, an apple should seem delicious; if it doesn't, something else is going on. Or you can try drinking water and making yourself busy, if you are still hungry try a healthy snack.  4. Not All Calories Are Created Equal The mechanics of weight loss are pretty simple: Take in fewer calories than you use for energy. But the kind of food you eat makes all the difference. Processed food that's high in saturated fat and refined starch or sugar can cause inflammation that disrupts the hormone  signals that tell your brain you're full. The result: You eat a lot more.  Use it to lose it. Clean up your diet. Swap in whole, unprocessed foods, including vegetables, lean protein, and healthy fats that will fill you up and give you the biggest nutritional bang for your calorie buck. In a few weeks, as your brain starts receiving regular hunger and fullness signals once again, you'll notice that you feel less hungry overall and naturally start cutting back on the amount you eat.  5. Protein, Produce, and Plant-Based Fats Are Your Weight-Loss Trinity Here's why eating the three Ps regularly will help you drop pounds. Protein fills you up. You need it to build lean muscle, which keeps your metabolism humming so that you can torch more fat. People in a weight-loss program who ate double the recommended daily allowance for protein (about 110 grams for a 150-pound woman) lost 70 percent of their weight from fat, while people who ate the RDA lost only about 40 percent, one study found. Produce is packed with filling fiber. "It's very difficult to consume too many calories if you're eating a lot of vegetables. Example: Three cups of broccoli is a lot of food, yet only 93 calories. (Fruit is another story. It can be easy to overeat and can contain a lot of calories from sugar, so be sure to monitor your intake.) Plant-based fats like olive oil and those in avocados and nuts are healthy and extra satiating.  Use it to lose it. Aim to incorporate each of the three Ps into every meal and snack. People who eat protein throughout the day are able to keep weight off, according to a study in the [place] of Clinical Nutrition. In addition to meat, poultry and seafood, good sources are beans, lentils, eggs, tofu, and yogurt. As for fat, keep portion sizes in check by measuring out salad dressing, oil, and nut butters (shoot for one to two tablespoons). Finally, eat veggies or a little fruit at every meal. People  who did that consumed 308 fewer calories but didn't feel any hungrier than when they didn't eat more produce.  7. How You Eat Is As Important As What You Eat In order for your brain to register that you're full, you need to focus on what you're eating. Sit down whenever you eat, preferably at a table. Turn off the TV or computer, put down your phone, and look at your food. Smell it. Chew slowly, and don't put another bite on your fork until you swallow. When women ate lunch this attentively, they consumed 30 percent less when snacking later than those who listened to an audiobook at lunchtime, according to a study in the [place] of Nutrition. 8. Weighing Yourself Really Works The scale provides the best evidence about whether your efforts are paying off. Seeing the numbers tick up or down or stagnate  is motivation to keep going-or to rethink your approach. A 2015 study at [facility] found that daily weigh-ins helped people lose more weight, keep it off, and maintain that loss, even after two years. Use it to lose it. Step on the scale at the same time every day for the best results. If your weight shoots up several pounds from one weigh-in to the next, don't freak out. Eating a lot of salt the night before or having your period is the likely culprit. The number should return to normal in a day or two. It's a steady climb that you need to do something about. 9. Too Much Stress and Too Little Sleep Are Your Enemies When you're tired and frazzled, your body cranks up the production of cortisol, the stress hormone that can cause carb cravings. Not getting enough sleep also boosts your levels of ghrelin, a hormone associated with hunger, while suppressing leptin, a hormone that signals fullness and satiety. People on a diet who slept only five and a half hours a night for two weeks lost 55 percent less fat and were hungrier than those who slept eight and a half hours, according to a study in the  [place]. Use it to lose it. Prioritize sleep, aiming for seven hours or more a night, which research shows helps lower stress. And make sure you're getting quality zzz's. If a snoring spouse or a fidgety cat wakes you up frequently throughout the night, you may end up getting the equivalent of just four hours of sleep, according to a study from [facility]. Keep pets out of the bedroom, and use a white-noise app to drown out snoring. 10. You Will Hit a plateau-And You Can Bust Through It As you slim down, your body releases much less leptin, the fullness hormone.  If you're not strength training, start right now. Building muscle can raise your metabolism to help you overcome a plateau. To keep your body challenged and burning calories, incorporate new moves and more intense intervals into your workouts or add another sweat session to your weekly routine. Alternatively, cut an extra 100 calories or so a day from your diet. Now that you've lost weight, your body simply doesn't need as much fuel.   Ways to cut 100 calories  1. Eat your eggs with hot sauce OR salsa instead of cheese.  Eggs are great for breakfast, but many people consider eggs and cheese to be BFFs. Instead of cheese-1 oz. of cheddar has 114 calories-top your eggs with hot sauce, which contains no calories and helps with satiety and metabolism. Salsa is also a great option!!  2. Top your toast, waffles or pancakes with mashed berries instead of jelly or syrup. Half a cup of berries-fresh, frozen or thawed-has about 40 calories, compared with 2 tbsp. of maple syrup or jelly, which both have about 100 calories. The berries will also give you a good punch of fiber, which helps keep you full and satisfied and won't spike blood sugar quickly like the jelly or syrup. 3. Swap the non-fat latte for black coffee with a splash of half-and-half. Contrary to its name, that non-fat latte has 130 calories and a  startling 19g of carbohydrates per 16 oz. serving. Replacing that 'light' drinkable dessert with a black coffee with a splash of half-and-half saves you more than 100 calories per 16 oz. serving. 4. Sprinkle salads with freeze-dried raspberries instead of dried cranberries. If you want a sweet addition to your  nutritious salad, stay away from dried cranberries. They have a whopping 130 calories per  cup and 30g carbohydrates. Instead, sprinkle freeze-dried raspberries guilt-free and save more than 100 calories per  cup serving, adding 3g of belly-filling fiber. 5. Go for mustard in place of mayo on your sandwich. Mustard can add really nice flavor to any sandwich, and there are tons of varieties, from spicy to honey. A serving of mayo is 95 calories, versus 10 calories in a serving of mustard. 6. Choose a DIY salad dressing instead of the store-bought kind. Mix Dijon or whole grain mustard with low-fat Kefir or red wine vinegar and garlic. 7. Use hummus as a spread instead of a dip. Use hummus as a spread on a high-fiber cracker or tortilla with a sandwich and save on calories without sacrificing taste. 8. Pick just one salad "accessory." Salad isn't automatically a calorie winner. It's easy to over-accessorize with toppings. Instead of topping your salad with nuts, avocado and cranberries (all three will clock in at 313 calories), just pick one. The next day, choose a different accessory, which will also keep your salad interesting. You don't wear all your jewelry every day, right? 9. Ditch the white pasta in favor of spaghetti squash. One cup of cooked spaghetti squash has about 40 calories, compared with traditional spaghetti, which comes with more than 200. Spaghetti squash is also nutrient-dense. It's a good source of fiber and Vitamins A and C, and it can be eaten just like you would eat pasta-with a great tomato sauce and [place] meatballs or with pesto, tofu and spinach, for example. 10. Dress  up your chili, soups and stews with non-fat [place] yogurt instead of sour cream. Just a 'dollop' of sour cream can set you back 115 calories and a whopping 12g of fat-seven of which are of the artery-clogging variety. Added bonus: [place] yogurt is packed with muscle-building protein, calcium and B Vitamins. 11. Mash cauliflower instead of mashed potatoes. One cup of traditional mashed potatoes-in all their creamy goodness-has more than 200 calories, compared to mashed cauliflower, which you can typically eat for less than 100 calories per 1 cup serving. Cauliflower is a great source of the antioxidant indole-3-carbinol (I3C), which may help reduce the risk of some cancers, like breast cancer. 12. Ditch the ice cream sundae in favor of a [place] yogurt parfait. Instead of a cup of ice cream or fro-yo for dessert, try 1 cup of nonfat Greek yogurt topped with fresh berries and a sprinkle of cacao nibs. Both toppings are packed with antioxidants, which can help reduce cellular inflammation and oxidative damage. And the comparison is a no-brainer: One cup of ice cream has about 275 calories; one cup of frozen yogurt has about 230; and a cup of Greek yogurt has just 130, plus twice the protein, so you're less likely to return to the freezer for a second helping. 13. Put olive oil in a spray container instead of using it directly from the bottle. Each tablespoon of olive oil is 120 calories and 15g of fat. Use a mister instead of pouring it straight into the pan or onto a salad. This allows for portion control and will save you more than 100 calories. 14. When baking, substitute canned pumpkin for butter or oil. Canned pumpkin-not pumpkin pie mix-is loaded with Vitamin A, which is important for skin and eye health, as well as immunity. And the comparisons are pretty crazy:  cup of canned pumpkin has about 40 calories, compared to  butter or oil, which has more than 800 calories. Yes, 800 calories. Applesauce and  mashed banana can also serve as good substitutions for butter or oil, usually in a 1:1 ratio. 15. Top casseroles with high-fiber cereal instead of breadcrumbs. Breadcrumbs are typically made with white bread, while breakfast cereals contain 5-9g of fiber per serving. Not only will you save more than 150 calories per  cup serving, the swap will also keep you more full and you'll get a metabolism boost from the added fiber. 16. Snack on pistachios instead of macadamia nuts. Believe it or not, you get the same amount of calories from 35 pistachios (100 calories) as you would from only five macadamia nuts. 17. Chow down on kale chips rather than potato chips. This is my favorite 'don't knock it 'till you try it' swap. Kale chips are so easy to make at home, and you can spice them up with a little grated parmesan or chili powder. Plus, they're a mere fraction of the calories of potato chips, but with the same crunch factor we crave so often. 18. Add seltzer and some fruit slices to your cocktail instead of soda or fruit juice. One cup of soda or fruit juice can pack on as much as 140 calories. Instead, use seltzer and fruit slices. The fruit provides valuable phytochemicals, such as flavonoids and anthocyanins, which help to combat cancer and stave off the aging process.

## 2014-05-29 LAB — BASIC METABOLIC PANEL WITH GFR
BUN: 14 mg/dL (ref 6–23)
CHLORIDE: 104 meq/L (ref 96–112)
CO2: 27 meq/L (ref 19–32)
Calcium: 9.2 mg/dL (ref 8.4–10.5)
Creat: 0.9 mg/dL (ref 0.50–1.35)
GFR, Est African American: >89 mL/min
GFR, Est Non African American: >89 mL/min
GLUCOSE: 100 mg/dL — AB (ref 70–99)
POTASSIUM: 3.7 meq/L (ref 3.5–5.3)
Sodium: 141 mEq/L (ref 135–145)

## 2014-05-29 LAB — HEMOGLOBIN A1C
HEMOGLOBIN A1C: 5.9 % — AB (ref <5.7)
MEAN PLASMA GLUCOSE: 123 mg/dL — AB (ref <117)

## 2014-05-29 LAB — LIPID PANEL
CHOL/HDL RATIO: 4.4 ratio
Cholesterol: 149 mg/dL (ref 0–200)
HDL: 34 mg/dL — ABNORMAL LOW (ref >=40)
LDL Cholesterol: 75 mg/dL (ref 0–99)
Triglycerides: 199 mg/dL — ABNORMAL HIGH (ref <150)
VLDL: 40 mg/dL (ref 0–40)

## 2014-05-29 LAB — HEPATIC FUNCTION PANEL
ALBUMIN: 4.3 g/dL (ref 3.5–5.2)
ALT: 14 U/L (ref 0–53)
AST: 25 U/L (ref 0–37)
Alkaline Phosphatase: 70 U/L (ref 39–117)
BILIRUBIN TOTAL: 0.6 mg/dL (ref 0.2–1.2)
Bilirubin, Direct: 0.1 mg/dL (ref 0.0–0.3)
Indirect Bilirubin: 0.5 mg/dL (ref 0.2–1.2)
Total Protein: 6.3 g/dL (ref 6.0–8.3)

## 2014-05-29 LAB — TSH: TSH: 1.681 u[IU]/mL (ref 0.350–4.500)

## 2014-05-29 LAB — VITAMIN D 25 HYDROXY (VIT D DEFICIENCY, FRACTURES): Vit D, 25-Hydroxy: 62 ng/mL (ref 30–100)

## 2014-05-29 LAB — INSULIN, FASTING: INSULIN FASTING, SERUM: 10.8 u[IU]/mL (ref 2.0–19.6)

## 2014-05-29 LAB — MAGNESIUM: Magnesium: 1.7 mg/dL (ref 1.5–2.5)

## 2014-06-28 ENCOUNTER — Other Ambulatory Visit: Payer: Self-pay | Admitting: Internal Medicine

## 2014-09-06 ENCOUNTER — Ambulatory Visit (INDEPENDENT_AMBULATORY_CARE_PROVIDER_SITE_OTHER): Payer: BLUE CROSS/BLUE SHIELD | Admitting: Internal Medicine

## 2014-09-06 ENCOUNTER — Encounter: Payer: Self-pay | Admitting: Internal Medicine

## 2014-09-06 VITALS — BP 100/64 | HR 60 | Temp 97.3°F | Resp 16 | Ht 68.25 in | Wt 198.0 lb

## 2014-09-06 DIAGNOSIS — R7309 Other abnormal glucose: Secondary | ICD-10-CM

## 2014-09-06 DIAGNOSIS — M1000 Idiopathic gout, unspecified site: Secondary | ICD-10-CM

## 2014-09-06 DIAGNOSIS — E291 Testicular hypofunction: Secondary | ICD-10-CM

## 2014-09-06 DIAGNOSIS — Z79899 Other long term (current) drug therapy: Secondary | ICD-10-CM

## 2014-09-06 DIAGNOSIS — I10 Essential (primary) hypertension: Secondary | ICD-10-CM

## 2014-09-06 DIAGNOSIS — R7303 Prediabetes: Secondary | ICD-10-CM

## 2014-09-06 DIAGNOSIS — Z6829 Body mass index (BMI) 29.0-29.9, adult: Secondary | ICD-10-CM

## 2014-09-06 DIAGNOSIS — E669 Obesity, unspecified: Secondary | ICD-10-CM

## 2014-09-06 DIAGNOSIS — E782 Mixed hyperlipidemia: Secondary | ICD-10-CM

## 2014-09-06 DIAGNOSIS — E349 Endocrine disorder, unspecified: Secondary | ICD-10-CM

## 2014-09-06 DIAGNOSIS — E559 Vitamin D deficiency, unspecified: Secondary | ICD-10-CM

## 2014-09-06 NOTE — Progress Notes (Signed)
Patient ID: Caleb Johns, male   DOB: 12-27-1955, 59 y.o.   MRN: [id number]    This very nice 59 y.o. DBM presents for 3 month follow up with Hypertension, Hyperlipidemia, Pre-Diabetes and Vitamin D Deficiency. Patient has hx/o gout with no recent flares on allopurinol therapy.     Patient is treated for HTN since 2004 & BP has been controlled at home. Today's BP: 100/64 mmHg. Patient has had no complaints of any cardiac type chest pain, palpitations, dyspnea/orthopnea/PND, dizziness, claudication, or dependent edema.    Hyperlipidemia is controlled with diet & meds. Patient denies myalgias or other med SE's. Last Lipids were  At goal - Cholesterol 149; HDL 34*; LDL 75; Trig 199 on 05/28/2014.    Also, the patient has history of PreDiabetes since 2009 with A1c 6.4% and has had no symptoms of reactive hypoglycemia, diabetic polys, paresthesias or visual blurring.  Last A1c was 5.9% on 05/28/2014.    Further, the patient also has history of Vitamin D Deficiency of 21 in 2008 and supplements vit D without any suspected side-effects. Last vitamin D was  62 on 05/28/2014.  Medication Sig  . allopurinol  300 MG tablet TAKE ONE TAB ONCE DAILY FOR GOUT.  Marland Kitchen MAALOX/MYLANTAL  Take 30 mLs  2  times daily.  Marland Kitchen aspirin EC 81 MG tablet Take  daily.  Marland Kitchen atenolol  100 MG tablet TAKE ONE TAB ONCE DAILY  . SUPER B COMPLEX  Take 1 tab daily.  Marland Kitchen VITAMIN D 2000 UNITS  Take daily.  Marland Kitchen FLAX SEED OIL 1000 MG CAPS Take  daily.  . hctz 25 MG tablet TAKE ONE TAB ONCE DAILY  . meloxicam  15 MG tablet Take 1 tab daily.  Marland Kitchen FISH OIL 1000 MG CAPS Take 1,000 mg  daily.  . pravastatin 40 MG tablet Take 1 tab  daily  . ranitidine  300 MG tablet Take  daily as needed for heartburn.  . tadalafil  20 MG tablet Take 1/2 to 1 tablet every  2 to 3 tablets as needed for XXXX  . triamcinolone cream (KENALOG) 0.1 % APPLY ONE APPLICATION TO RASH ONCE DAILY AS NEEDED.  Marland Kitchen acyclovir  800 MG tablet Take 1 tablet daily  . tamsulosin  0.4 MG CAPS  capsule Take 1 cap daily.   Allergies  Allergen Reactions  . Ace Inhibitors     REACTION: cough  . Trilipix [Choline Fenofibrate] Other (See Comments)    Heart burn   PMHx:   Past Medical History  Diagnosis Date  . Hypertension   . Hyperlipidemia   . Obese   . Pre-diabetes   . Hypogonadism male   . Gout   . HSV-1 (herpes simplex virus 1) infection   . HSV-2 (herpes simplex virus 2) infection   . GERD (gastroesophageal reflux disease)   . Pneumonia     walking -many years ago   Immunization History  Administered Date(s) Administered  . PPD Test 02/13/2014  . Pneumococcal Polysaccharide-23 02/23/2002  . Tdap 05/15/2009   Past Surgical History  Procedure Laterality Date  . Back surgery  2012    on back-hernia  . Laparoscopic inguinal hernia with umbilical hernia Right 3/55/7322    Procedure: LAPAROSCOPIC RIGH  INGUINAL HERNIA WITH OPEN UMBILICAL HERNIA;  Surgeon: Odis Hollingshead, MD;  Location: [facility];  Service: General;  Laterality: Right;  umbilical repair with mesh  . Insertion of mesh N/A 08/15/2013    Procedure: INSERTION OF MESH;  Surgeon: Rhunette Croft  Rosenbower, MD;  Location: [facility];  Service: General;  Laterality: N/A;  . Hernia repair     FHx:    Reviewed / unchanged  SHx:    Reviewed / unchanged  Systems Review:  Constitutional: Denies fever, chills, wt changes, headaches, insomnia, fatigue, night sweats, change in appetite. Eyes: Denies redness, blurred vision, diplopia, discharge, itchy, watery eyes.  ENT: Denies discharge, congestion, post nasal drip, epistaxis, sore throat, earache, hearing loss, dental pain, tinnitus, vertigo, sinus pain, snoring.  CV: Denies chest pain, palpitations, irregular heartbeat, syncope, dyspnea, diaphoresis, orthopnea, PND, claudication or edema. Respiratory: denies cough, dyspnea, DOE, pleurisy, hoarseness, laryngitis, wheezing.  Gastrointestinal: Denies dysphagia, odynophagia, heartburn, reflux, water brash, abdominal pain or  cramps, nausea, vomiting, bloating, diarrhea, constipation, hematemesis, melena, hematochezia  or hemorrhoids. Genitourinary: Denies dysuria, frequency, urgency, nocturia, hesitancy, discharge, hematuria or flank pain. Musculoskeletal: Denies arthralgias, myalgias, stiffness, jt. swelling, pain, limping or strain/sprain.  Skin: Denies pruritus, rash, hives, warts, acne, eczema or change in skin lesion(s). Neuro: No weakness, tremor, incoordination, spasms, paresthesia or pain. Psychiatric: Denies confusion, memory loss or sensory loss. Endo: Denies change in weight, skin or hair change.  Heme/Lymph: No excessive bleeding, bruising or enlarged lymph nodes.  Physical Exam  BP 100/64   Pulse 60  Temp 97.3 F   Resp 16  Ht 5' 8.25"   Wt 198 lb     BMI 29.87   Appears well nourished and in no distress.  Eyes: PERRLA, EOMs, conjunctiva no swelling or erythema. Sinuses: No frontal/maxillary tenderness ENT/Mouth: EAC's clear, TM's nl w/o erythema, bulging. Nares clear w/o erythema, swelling, exudates. Oropharynx clear without erythema or exudates. Oral hygiene is good. Tongue normal, non obstructing. Hearing intact.  Neck: Supple. Thyroid nl. Car 2+/2+ without bruits, nodes or JVD. Chest: Respirations nl with BS clear & equal w/o rales, rhonchi, wheezing or stridor.  Cor: Heart sounds normal w/ regular rate and rhythm without sig. murmurs, gallops, clicks, or rubs. Peripheral pulses normal and equal  without edema.  Abdomen: Soft & bowel sounds normal. Non-tender w/o guarding, rebound, hernias, masses, or organomegaly.  Lymphatics: Unremarkable.  Musculoskeletal: Full ROM all peripheral extremities, joint stability, 5/5 strength, and normal gait.  Skin: Warm, dry without exposed rashes, lesions or ecchymosis apparent.  Neuro: Cranial nerves intact, reflexes equal bilaterally. Sensory-motor testing grossly intact. Tendon reflexes grossly intact.  Pysch: Alert & oriented x 3.  Insight and  judgement nl & appropriate. No ideations.  Assessment and Plan:  1. Essential hypertension  - TSH  2. Hyperlipidemia  - Lipid panel  3. Prediabetes  - Hemoglobin A1c - Insulin, random  4. Vitamin D deficiency  - Vit D  25 hydroxy   5. Testosterone deficiency   6. Idiopathic gout  - Uric acid  7. Obesity   8. Medication management  - CBC with Differential/Platelet - BASIC METABOLIC PANEL WITH GFR - Hepatic function panel - Magnesium  9. BMI 29.0-29.9   Recommended regular exercise, BP monitoring, weight control, and discussed med and SE's. Recommended labs to assess and monitor clinical status. Further disposition pending results of labs. Over 30 minutes of exam, counseling, chart review was performed

## 2014-09-06 NOTE — Patient Instructions (Signed)

## 2014-09-07 LAB — HEMOGLOBIN A1C
Hgb A1c MFr Bld: 5.8 % — ABNORMAL HIGH (ref <5.7)
Mean Plasma Glucose: 120 mg/dL — ABNORMAL HIGH (ref <117)

## 2014-09-07 LAB — CBC WITH DIFFERENTIAL/PLATELET
Basophils Absolute: 0 10*3/uL (ref 0.0–0.1)
Basophils Relative: 0 % (ref 0–1)
Eosinophils Absolute: 0.3 10*3/uL (ref 0.0–0.7)
Eosinophils Relative: 6 % — ABNORMAL HIGH (ref 0–5)
HCT: 42.5 % (ref 39.0–52.0)
HEMOGLOBIN: 14.3 g/dL (ref 13.0–17.0)
Lymphocytes Relative: 54 % — ABNORMAL HIGH (ref 12–46)
Lymphs Abs: 2.5 10*3/uL (ref 0.7–4.0)
MCH: 30.1 pg (ref 26.0–34.0)
MCHC: 33.6 g/dL (ref 30.0–36.0)
MCV: 89.5 fL (ref 78.0–100.0)
MONO ABS: 0.3 10*3/uL (ref 0.1–1.0)
MPV: 11.6 fL (ref 8.6–12.4)
Monocytes Relative: 6 % (ref 3–12)
NEUTROS PCT: 34 % — AB (ref 43–77)
Neutro Abs: 1.6 10*3/uL — ABNORMAL LOW (ref 1.7–7.7)
PLATELETS: 146 10*3/uL — AB (ref 150–400)
RBC: 4.75 MIL/uL (ref 4.22–5.81)
RDW: 13.6 % (ref 11.5–15.5)
WBC: 4.6 10*3/uL (ref 4.0–10.5)

## 2014-09-07 LAB — HEPATIC FUNCTION PANEL
ALK PHOS: 76 U/L (ref 39–117)
ALT: 14 U/L (ref 0–53)
AST: 24 U/L (ref 0–37)
Albumin: 4.2 g/dL (ref 3.5–5.2)
Bilirubin, Direct: 0.1 mg/dL (ref 0.0–0.3)
Indirect Bilirubin: 0.4 mg/dL (ref 0.2–1.2)
TOTAL PROTEIN: 6.5 g/dL (ref 6.0–8.3)
Total Bilirubin: 0.5 mg/dL (ref 0.2–1.2)

## 2014-09-07 LAB — INSULIN, RANDOM: INSULIN: 11.5 u[IU]/mL (ref 2.0–19.6)

## 2014-09-07 LAB — LIPID PANEL
CHOL/HDL RATIO: 3.9 ratio
CHOLESTEROL: 143 mg/dL (ref 0–200)
HDL: 37 mg/dL — AB (ref >=40)
LDL Cholesterol: 66 mg/dL (ref 0–99)
TRIGLYCERIDES: 202 mg/dL — AB (ref <150)
VLDL: 40 mg/dL (ref 0–40)

## 2014-09-07 LAB — URIC ACID: Uric Acid, Serum: 4.3 mg/dL (ref 4.0–7.8)

## 2014-09-07 LAB — BASIC METABOLIC PANEL WITH GFR
BUN: 15 mg/dL (ref 6–23)
CO2: 26 mEq/L (ref 19–32)
CREATININE: 0.93 mg/dL (ref 0.50–1.35)
Calcium: 9.7 mg/dL (ref 8.4–10.5)
Chloride: 102 mEq/L (ref 96–112)
GLUCOSE: 93 mg/dL (ref 70–99)
Potassium: 3.7 mEq/L (ref 3.5–5.3)
SODIUM: 142 meq/L (ref 135–145)

## 2014-09-07 LAB — TSH: TSH: 1.167 u[IU]/mL (ref 0.350–4.500)

## 2014-09-07 LAB — MAGNESIUM: Magnesium: 2 mg/dL (ref 1.5–2.5)

## 2014-09-07 LAB — VITAMIN D 25 HYDROXY (VIT D DEFICIENCY, FRACTURES): Vit D, 25-Hydroxy: 41 ng/mL (ref 30–100)

## 2014-09-09 ENCOUNTER — Other Ambulatory Visit: Payer: Self-pay | Admitting: Physician Assistant

## 2014-11-20 ENCOUNTER — Other Ambulatory Visit: Payer: Self-pay | Admitting: Internal Medicine

## 2014-12-12 ENCOUNTER — Encounter: Payer: Self-pay | Admitting: Physician Assistant

## 2014-12-12 ENCOUNTER — Ambulatory Visit (INDEPENDENT_AMBULATORY_CARE_PROVIDER_SITE_OTHER): Payer: BLUE CROSS/BLUE SHIELD | Admitting: Physician Assistant

## 2014-12-12 VITALS — BP 130/70 | HR 60 | Temp 97.7°F | Resp 16 | Ht 68.25 in | Wt 198.0 lb

## 2014-12-12 DIAGNOSIS — Z79899 Other long term (current) drug therapy: Secondary | ICD-10-CM

## 2014-12-12 DIAGNOSIS — E559 Vitamin D deficiency, unspecified: Secondary | ICD-10-CM | POA: Diagnosis not present

## 2014-12-12 DIAGNOSIS — E669 Obesity, unspecified: Secondary | ICD-10-CM

## 2014-12-12 DIAGNOSIS — E291 Testicular hypofunction: Secondary | ICD-10-CM

## 2014-12-12 DIAGNOSIS — E782 Mixed hyperlipidemia: Secondary | ICD-10-CM | POA: Diagnosis not present

## 2014-12-12 DIAGNOSIS — I10 Essential (primary) hypertension: Secondary | ICD-10-CM

## 2014-12-12 DIAGNOSIS — R7303 Prediabetes: Secondary | ICD-10-CM

## 2014-12-12 DIAGNOSIS — E349 Endocrine disorder, unspecified: Secondary | ICD-10-CM

## 2014-12-12 LAB — CBC WITH DIFFERENTIAL/PLATELET
BASOS PCT: 1 % (ref 0–1)
Basophils Absolute: 0 10*3/uL (ref 0.0–0.1)
EOS PCT: 7 % — AB (ref 0–5)
Eosinophils Absolute: 0.3 10*3/uL (ref 0.0–0.7)
HCT: 43.9 % (ref 39.0–52.0)
Hemoglobin: 14.8 g/dL (ref 13.0–17.0)
Lymphocytes Relative: 50 % — ABNORMAL HIGH (ref 12–46)
Lymphs Abs: 2.5 10*3/uL (ref 0.7–4.0)
MCH: 30.5 pg (ref 26.0–34.0)
MCHC: 33.7 g/dL (ref 30.0–36.0)
MCV: 90.3 fL (ref 78.0–100.0)
MONO ABS: 0.4 10*3/uL (ref 0.1–1.0)
MPV: 11.4 fL (ref 8.6–12.4)
Monocytes Relative: 9 % (ref 3–12)
Neutro Abs: 1.6 10*3/uL — ABNORMAL LOW (ref 1.7–7.7)
Neutrophils Relative %: 33 % — ABNORMAL LOW (ref 43–77)
Platelets: 149 10*3/uL — ABNORMAL LOW (ref 150–400)
RBC: 4.86 MIL/uL (ref 4.22–5.81)
RDW: 13.9 % (ref 11.5–15.5)
WBC: 4.9 10*3/uL (ref 4.0–10.5)

## 2014-12-12 NOTE — Progress Notes (Signed)
Assessment and Plan:  1. Hypertension -Continue medication but can cut atenolol in half due to weight loss, dizziness and bradycardia, monitor blood pressure at home. Continue DASH diet.  Reminder to go to the ER if any CP, SOB, nausea, dizziness, severe HA, changes vision/speech, left arm numbness and tingling and jaw pain.  2. Cholesterol -Continue diet and exercise. Check cholesterol.   3. Prediabetes  -Continue diet and exercise. Check A1C  4. Vitamin D Def - check level and continue medications.   5. Obesity with co morbidities - long discussion about weight loss, diet, and exercise. Stop sweet tea and gatorade.    Continue diet and meds as discussed. Further disposition pending results of labs. Over 30 minutes of exam, counseling, chart review, and critical decision making was performed  HPI 59 y.o. AA male  presents for 3 month follow up on hypertension, cholesterol, prediabetes, and vitamin D deficiency.   His blood pressure has been controlled at home, today their BP is BP: 130/70 mmHg  He does workout, he has been walking with the nice weaither. He denies chest pain, shortness of breath. He has had some dizziness with standing and heart rate has been in 50's.   He is on cholesterol medication, pravastatin 40 1/2 pill M,W,F and denies myalgias. His cholesterol is at goal. The cholesterol last visit was:   Lab Results  Component Value Date   CHOL 143 09/06/2014   HDL 37* 09/06/2014   LDLCALC 66 09/06/2014   TRIG 202* 09/06/2014   CHOLHDL 3.9 09/06/2014   He has been working on diet and exercise for prediabetes, and denies paresthesia of the feet, polydipsia, polyuria and visual disturbances. Last A1C in the office was:  Lab Results  Component Value Date   HGBA1C 5.8* 09/06/2014  Patient is on Vitamin D supplement.   Lab Results  Component Value Date   VD25OH 41 09/06/2014  BMI is Body mass index is 29.87 kg/(m^2)., he is working on diet and exercise and has done a good  job with weight loss. 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  Lab Results  Component Value Date   LABURIC 4.3 09/06/2014    Current Medications:  Current Outpatient Prescriptions on File Prior to Visit  Medication Sig Dispense Refill  . allopurinol (ZYLOPRIM) 300 MG tablet TAKE ONE TABLET BY MOUTH ONCE DAILY FOR GOUT. 90 tablet 1  . alum & mag hydroxide-simeth (MAALOX/MYLANTA) 200-200-20 MG/5ML suspension Take 30 mLs by mouth 2 (two) times daily.    Marland Kitchen aspirin EC 81 MG tablet Take 81 mg by mouth daily.    Marland Kitchen atenolol (TENORMIN) 100 MG tablet TAKE ONE TABLET BY MOUTH ONCE DAILY 90 tablet 3  . B Complex-C (SUPER B COMPLEX PO) Take 1 tablet by mouth daily.    . Cholecalciferol (VITAMIN D) 2000 UNITS CAPS Take 2,000 Units by mouth daily.    . Flaxseed, Linseed, (FLAX SEED OIL) 1000 MG CAPS Take 1,000 mg by mouth daily.    Marland Kitchen glucose blood (FREESTYLE  TEST STRIPS) test strip Use as instructed, DX 250.00 100 each 12  . hydrochlorothiazide (HYDRODIURIL) 25 MG tablet TAKE ONE TABLET BY MOUTH ONCE DAILY 90 tablet 1  . Lancets (FREESTYLE) lancets   12  . meloxicam (MOBIC) 15 MG tablet Take 1 tablet (15 mg total) by mouth daily. 90 tablet 4  . Omega-3 Fatty Acids (FISH OIL) 1000 MG CAPS Take 1,000 mg by mouth daily.    . pravastatin (PRAVACHOL) 40 MG tablet TAKE ONE TABLET BY MOUTH ONCE DAILY 90 tablet 1  . ranitidine (ZANTAC) 300 MG tablet Take 300 mg by mouth daily as needed for heartburn.    . ranitidine (ZANTAC) 300 MG tablet TAKE ONE TABLET BY MOUTH TWICE DAILY 180 tablet 1   . tadalafil (CIALIS) 20 MG tablet Take 1/2 to 1 tablet every  2 to 3 tablets as needed for XXXX 100 tablet 99  . triamcinolone cream (KENALOG) 0.1 % APPLY ONE APPLICATION TO RASH ONCE DAILY AS NEEDED. 45 g 3   No current facility-administered medications on file prior to visit.   Medical History:  Past Medical History  Diagnosis Date  . Hypertension   . Hyperlipidemia   . Obese   . Pre-diabetes   . Hypogonadism male   . Gout   . HSV-1 (herpes simplex virus 1) infection   . HSV-2 (herpes simplex virus 2) infection   . GERD (gastroesophageal reflux disease)   . Pneumonia     walking -many years ago   Allergies:  Allergies  Allergen Reactions  . Ace Inhibitors     REACTION: cough  . Trilipix [Choline Fenofibrate] Other (See Comments)    Heart burn     Review of Systems:  Review of Systems  Constitutional: Negative.   HENT: Negative.   Eyes: Negative.   Respiratory: Negative.   Cardiovascular: Negative.   Gastrointestinal: Negative.   Genitourinary: Negative.   Musculoskeletal: Negative.   Skin: Negative for itching and rash.  Neurological: Negative for dizziness, tingling, tremors, sensory change, speech change, focal weakness, seizures and loss of consciousness.  Endo/Heme/Allergies: Negative.   Psychiatric/Behavioral: Negative.     Family history- Review and unchanged Social history- Review and unchanged Physical Exam: BP 130/70 mmHg  Pulse 60  Temp(Src) 97.7 F (36.5 C) (Temporal)  Resp 16  Ht 5' 8.25" (1.734 m)  Wt 198 lb (89.812 kg)  BMI 29.87 kg/m2  SpO2 95% Wt Readings from Last 3 Encounters:  12/12/14 198 lb (89.812 kg)  09/06/14 198 lb (89.812 kg)  05/28/14 211 lb (95.709 kg)   General Appearance: Well nourished, in no apparent distress. Eyes: PERRLA, EOMs, conjunctiva no swelling or erythema Sinuses: No Frontal/maxillary tenderness ENT/Mouth: Ext aud canals clear, TMs without erythema, bulging. No erythema, swelling, or exudate on post  pharynx.  Tonsils not swollen or erythematous. Hearing normal.  Neck: Supple, thyroid normal.  Respiratory: Respiratory effort normal, BS equal bilaterally without rales, rhonchi, wheezing or stridor.  Cardio: RR, bradycardia with no MRGs. Brisk peripheral pulses without edema.  Abdomen: Soft, + BS,  Non tender, no guarding, rebound, hernias, masses. Lymphatics: Non tender without lymphadenopathy.  Musculoskeletal: Full ROM, 5/5 strength, Normal gait Skin: Warm, dry without rashes, lesions, ecchymosis.  Neuro: Cranial nerves intact. Normal muscle tone, no cerebellar symptoms. Psych: Awake and oriented X 3, normal affect, Insight and Judgment appropriate.    Vicie Mutters, PA-C 4:36 PM [facility]

## 2014-12-12 NOTE — Patient Instructions (Addendum)
Please pick one of the over the counter allergy medications below and take it once daily for allergies.  Claritin or loratadine cheapest but likely the weakest  Zyrtec or certizine at night because it can make you sleepy The strongest is allegra or fexafinadine  Cheapest at [employer], [employer], [employer] GET THE GENERIC  We want weight loss that will last so you should lose 1-2 pounds a week.  THAT IS IT! Please pick THREE things a month to change. Once it is a habit check off the item. Then pick another three items off the list to become habits.  If you are already doing a habit on the list GREAT!  Cross that item off! o Don't drink your calories. Ie, alcohol, soda, fruit juice, and sweet tea.  o Drink more water. Drink a glass when you feel hungry or before each meal.  o Eat breakfast - Complex carb and protein (likeDannon light and fit yogurt, oatmeal, fruit, eggs, [place] bacon). o Measure your cereal.  Eat no more than one cup a day. (ie [place]) o Eat an apple a day. o Add a vegetable a day. o Try a new vegetable a month. o Use Pam! Stop using oil or butter to cook. o Don't finish your plate or use smaller plates. o Share your dessert. o Eat sugar free Jello for dessert or frozen grapes. o Don't eat 2-3 hours before bed. o Switch to whole wheat bread, pasta, and brown rice. o Make healthier choices when you eat out. No fries! o Pick baked chicken, NOT fried. o Don't forget to SLOW DOWN when you eat. It is not going anywhere.  o Take the stairs. o Park far away in the parking lot o [employer] (or weights) for 10 minutes while watching TV. o Walk at work for 10 minutes during break. o Walk outside 1 time a week with your friend, kids, dog, or significant other. o Start a walking group at [place] the mall as much as you can tolerate.  o Keep a food diary. o Weigh yourself daily. o Walk for 15 minutes 3 days per week. o Cook at home more often and eat out less.  If life happens  and you go back to old habits, it is okay.  Just start over. You can do it!   If you experience chest pain, get short of breath, or tired during the exercise, please stop immediately and inform your doctor.   Before you even begin to attack a weight-loss plan, it pays to remember this: You are not fat. You have fat. Losing weight isn't about blame or shame; it's simply another achievement to accomplish. Dieting is like any other skill-you have to buckle down and work at it. As long as you act in a smart, reasonable way, you'll ultimately get where you want to be. Here are some weight loss pearls for you.  1. It's Not a Diet. It's a Lifestyle Thinking of a diet as something you're on and suffering through only for the short term doesn't work. To shed weight and keep it off, you need to make permanent changes to the way you eat. It's OK to indulge occasionally, of course, but if you cut calories temporarily and then revert to your old way of eating, you'll gain back the weight quicker than you can say yo-yo. Use it to lose it. Research shows that one of the best predictors of long-term weight loss is how many pounds you drop in  the first month. For that reason, nutritionists often suggest being stricter for the first two weeks of your new eating strategy to build momentum. Cut out added sugar and alcohol and avoid unrefined carbs. After that, figure out how you can reincorporate them in a way that's healthy and maintainable.  2. There's a Right Way to Exercise Working out burns calories and fat and boosts your metabolism by building muscle. But those trying to lose weight are notorious for overestimating the number of calories they burn and underestimating the amount they take in. Unfortunately, your system is biologically programmed to hold on to extra pounds and that means when you start exercising, your body senses the deficit and ramps up its hunger signals. If you're not diligent, you'll eat everything  you burn and then some. Use it to lose it. Cardio gets all the exercise glory, but strength and interval training are the real heroes. They help you build lean muscle, which in turn increases your metabolism and calorie-burning ability 3. Don't Overreact to Mild Hunger Some people have a hard time losing weight because of hunger anxiety. To them, being hungry is bad-something to be avoided at all costs-so they carry snacks with them and eat when they don't need to. Others eat because they're stressed out or bored. While you never want to get to the point of being ravenous (that's when bingeing is likely to happen), a hunger pang, a craving, or the fact that it's 3:00 p.m. should not send you racing for the vending machine or obsessing about the energy bar in your purse. Ideally, you should put off eating until your stomach is growling and it's difficult to concentrate.  Use it to lose it. When you feel the urge to eat, use the HALT method. Ask yourself, Am I really hungry? Or am I angry or anxious, lonely or bored, or tired? If you're still not certain, try the apple test. If you're truly hungry, an apple should seem delicious; if it doesn't, something else is going on. Or you can try drinking water and making yourself busy, if you are still hungry try a healthy snack.  4. Not All Calories Are Created Equal The mechanics of weight loss are pretty simple: Take in fewer calories than you use for energy. But the kind of food you eat makes all the difference. Processed food that's high in saturated fat and refined starch or sugar can cause inflammation that disrupts the hormone signals that tell your brain you're full. The result: You eat a lot more.  Use it to lose it. Clean up your diet. Swap in whole, unprocessed foods, including vegetables, lean protein, and healthy fats that will fill you up and give you the biggest nutritional bang for your calorie buck. In a few weeks, as your brain starts receiving  regular hunger and fullness signals once again, you'll notice that you feel less hungry overall and naturally start cutting back on the amount you eat.  5. Protein, Produce, and Plant-Based Fats Are Your Weight-Loss Trinity Here's why eating the three Ps regularly will help you drop pounds. Protein fills you up. You need it to build lean muscle, which keeps your metabolism humming so that you can torch more fat. People in a weight-loss program who ate double the recommended daily allowance for protein (about 110 grams for a 150-pound woman) lost 70 percent of their weight from fat, while people who ate the RDA lost only about 40 percent, one study found. Produce is packed  with filling fiber. "It's very difficult to consume too many calories if you're eating a lot of vegetables. Example: Three cups of broccoli is a lot of food, yet only 93 calories. (Fruit is another story. It can be easy to overeat and can contain a lot of calories from sugar, so be sure to monitor your intake.) Plant-based fats like olive oil and those in avocados and nuts are healthy and extra satiating.  Use it to lose it. Aim to incorporate each of the three Ps into every meal and snack. People who eat protein throughout the day are able to keep weight off, according to a study in the [place] of Clinical Nutrition. In addition to meat, poultry and seafood, good sources are beans, lentils, eggs, tofu, and yogurt. As for fat, keep portion sizes in check by measuring out salad dressing, oil, and nut butters (shoot for one to two tablespoons). Finally, eat veggies or a little fruit at every meal. People who did that consumed 308 fewer calories but didn't feel any hungrier than when they didn't eat more produce.  7. How You Eat Is As Important As What You Eat In order for your brain to register that you're full, you need to focus on what you're eating. Sit down whenever you eat, preferably at a table. Turn off the TV or computer,  put down your phone, and look at your food. Smell it. Chew slowly, and don't put another bite on your fork until you swallow. When women ate lunch this attentively, they consumed 30 percent less when snacking later than those who listened to an audiobook at lunchtime, according to a study in the [place] of Nutrition. 8. Weighing Yourself Really Works The scale provides the best evidence about whether your efforts are paying off. Seeing the numbers tick up or down or stagnate is motivation to keep going-or to rethink your approach. A 2015 study at [facility] found that daily weigh-ins helped people lose more weight, keep it off, and maintain that loss, even after two years. Use it to lose it. Step on the scale at the same time every day for the best results. If your weight shoots up several pounds from one weigh-in to the next, don't freak out. Eating a lot of salt the night before or having your period is the likely culprit. The number should return to normal in a day or two. It's a steady climb that you need to do something about. 9. Too Much Stress and Too Little Sleep Are Your Enemies When you're tired and frazzled, your body cranks up the production of cortisol, the stress hormone that can cause carb cravings. Not getting enough sleep also boosts your levels of ghrelin, a hormone associated with hunger, while suppressing leptin, a hormone that signals fullness and satiety. People on a diet who slept only five and a half hours a night for two weeks lost 55 percent less fat and were hungrier than those who slept eight and a half hours, according to a study in the [place]. Use it to lose it. Prioritize sleep, aiming for seven hours or more a night, which research shows helps lower stress. And make sure you're getting quality zzz's. If a snoring spouse or a fidgety cat wakes you up frequently throughout the night, you may end up getting the equivalent of just four  hours of sleep, according to a study from [facility]. Keep pets out of the bedroom, and use a white-noise  app to drown out snoring. 10. You Will Hit a plateau-And You Can Bust Through It As you slim down, your body releases much less leptin, the fullness hormone.  If you're not strength training, start right now. Building muscle can raise your metabolism to help you overcome a plateau. To keep your body challenged and burning calories, incorporate new moves and more intense intervals into your workouts or add another sweat session to your weekly routine. Alternatively, cut an extra 100 calories or so a day from your diet. Now that you've lost weight, your body simply doesn't need as much fuel.

## 2014-12-13 LAB — LIPID PANEL
Cholesterol: 158 mg/dL (ref 125–200)
HDL: 44 mg/dL (ref >=40)
LDL Cholesterol: 95 mg/dL (ref <130)
TRIGLYCERIDES: 93 mg/dL (ref <150)
Total CHOL/HDL Ratio: 3.6 Ratio (ref <=5.0)
VLDL: 19 mg/dL (ref <30)

## 2014-12-13 LAB — HEPATIC FUNCTION PANEL
ALT: 17 U/L (ref 9–46)
AST: 28 U/L (ref 10–35)
Albumin: 4.4 g/dL (ref 3.6–5.1)
Alkaline Phosphatase: 62 U/L (ref 40–115)
BILIRUBIN DIRECT: 0.2 mg/dL (ref <=0.2)
BILIRUBIN INDIRECT: 0.5 mg/dL (ref 0.2–1.2)
TOTAL PROTEIN: 6.8 g/dL (ref 6.1–8.1)
Total Bilirubin: 0.7 mg/dL (ref 0.2–1.2)

## 2014-12-13 LAB — BASIC METABOLIC PANEL WITH GFR
BUN: 14 mg/dL (ref 7–25)
CALCIUM: 9.6 mg/dL (ref 8.6–10.3)
CO2: 27 mmol/L (ref 20–31)
Chloride: 102 mmol/L (ref 98–110)
Creat: 0.81 mg/dL (ref 0.70–1.33)
Glucose, Bld: 89 mg/dL (ref 65–99)
Potassium: 3.5 mmol/L (ref 3.5–5.3)
SODIUM: 138 mmol/L (ref 135–146)

## 2014-12-13 LAB — TSH: TSH: 1.449 u[IU]/mL (ref 0.350–4.500)

## 2014-12-13 LAB — HEMOGLOBIN A1C
Hgb A1c MFr Bld: 5.7 % — ABNORMAL HIGH (ref <5.7)
MEAN PLASMA GLUCOSE: 117 mg/dL — AB (ref <117)

## 2014-12-13 LAB — MAGNESIUM: Magnesium: 1.8 mg/dL (ref 1.5–2.5)

## 2014-12-13 LAB — INSULIN, FASTING: INSULIN FASTING, SERUM: 4.9 u[IU]/mL (ref 2.0–19.6)

## 2014-12-13 LAB — VITAMIN D 25 HYDROXY (VIT D DEFICIENCY, FRACTURES): VIT D 25 HYDROXY: 45 ng/mL (ref 30–100)

## 2014-12-29 ENCOUNTER — Other Ambulatory Visit: Payer: Self-pay | Admitting: Internal Medicine

## 2015-02-17 IMAGING — CR DG CHEST 2V
2 series · 2 of 2 positions shown · non-contrast
Comparison: PA and lateral chest of May 07, 2003

CLINICAL DATA: Preoperative for laparoscopic right inguinal hernia
repair.

EXAM:
CHEST  2 VIEW

[w chest pa]
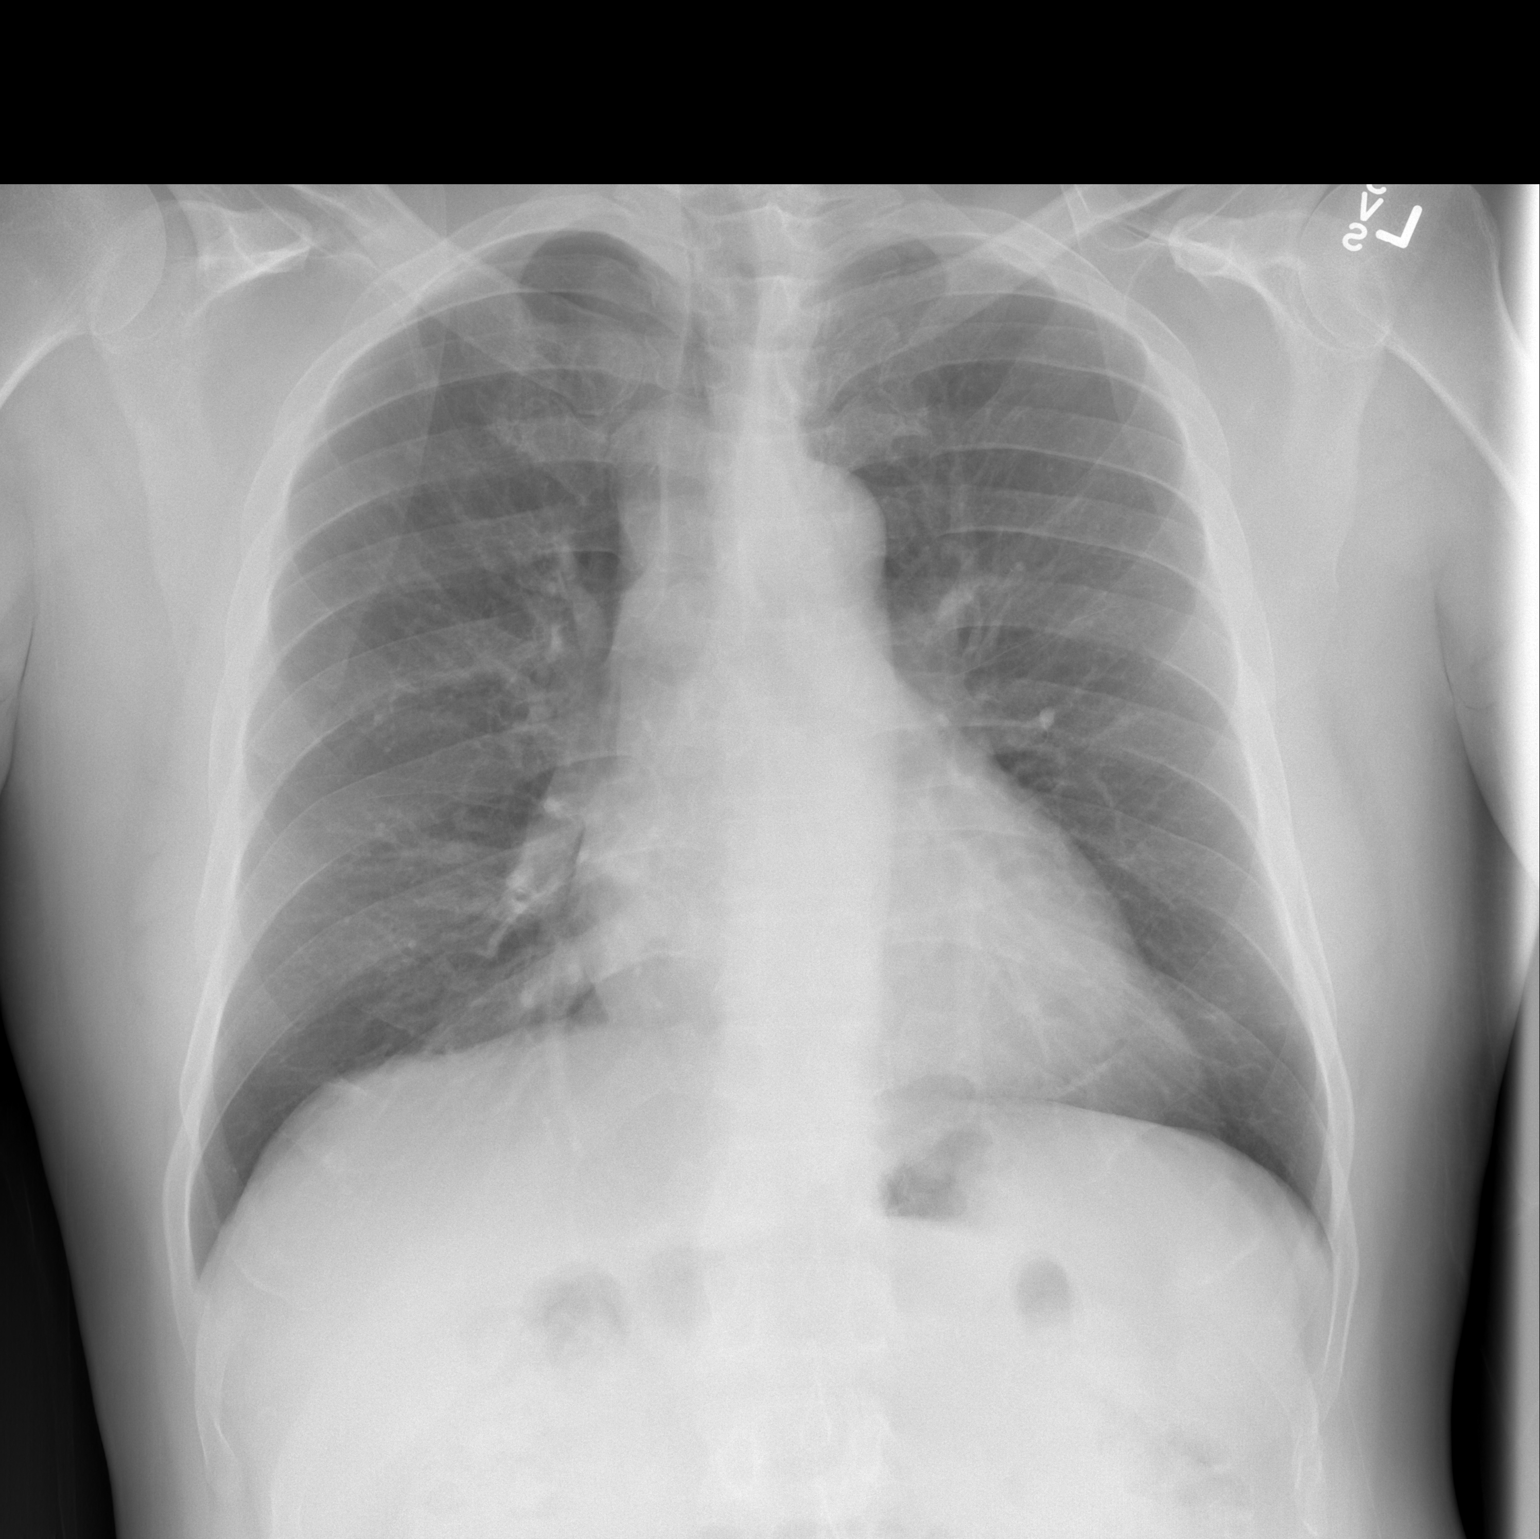

[w chest lat]
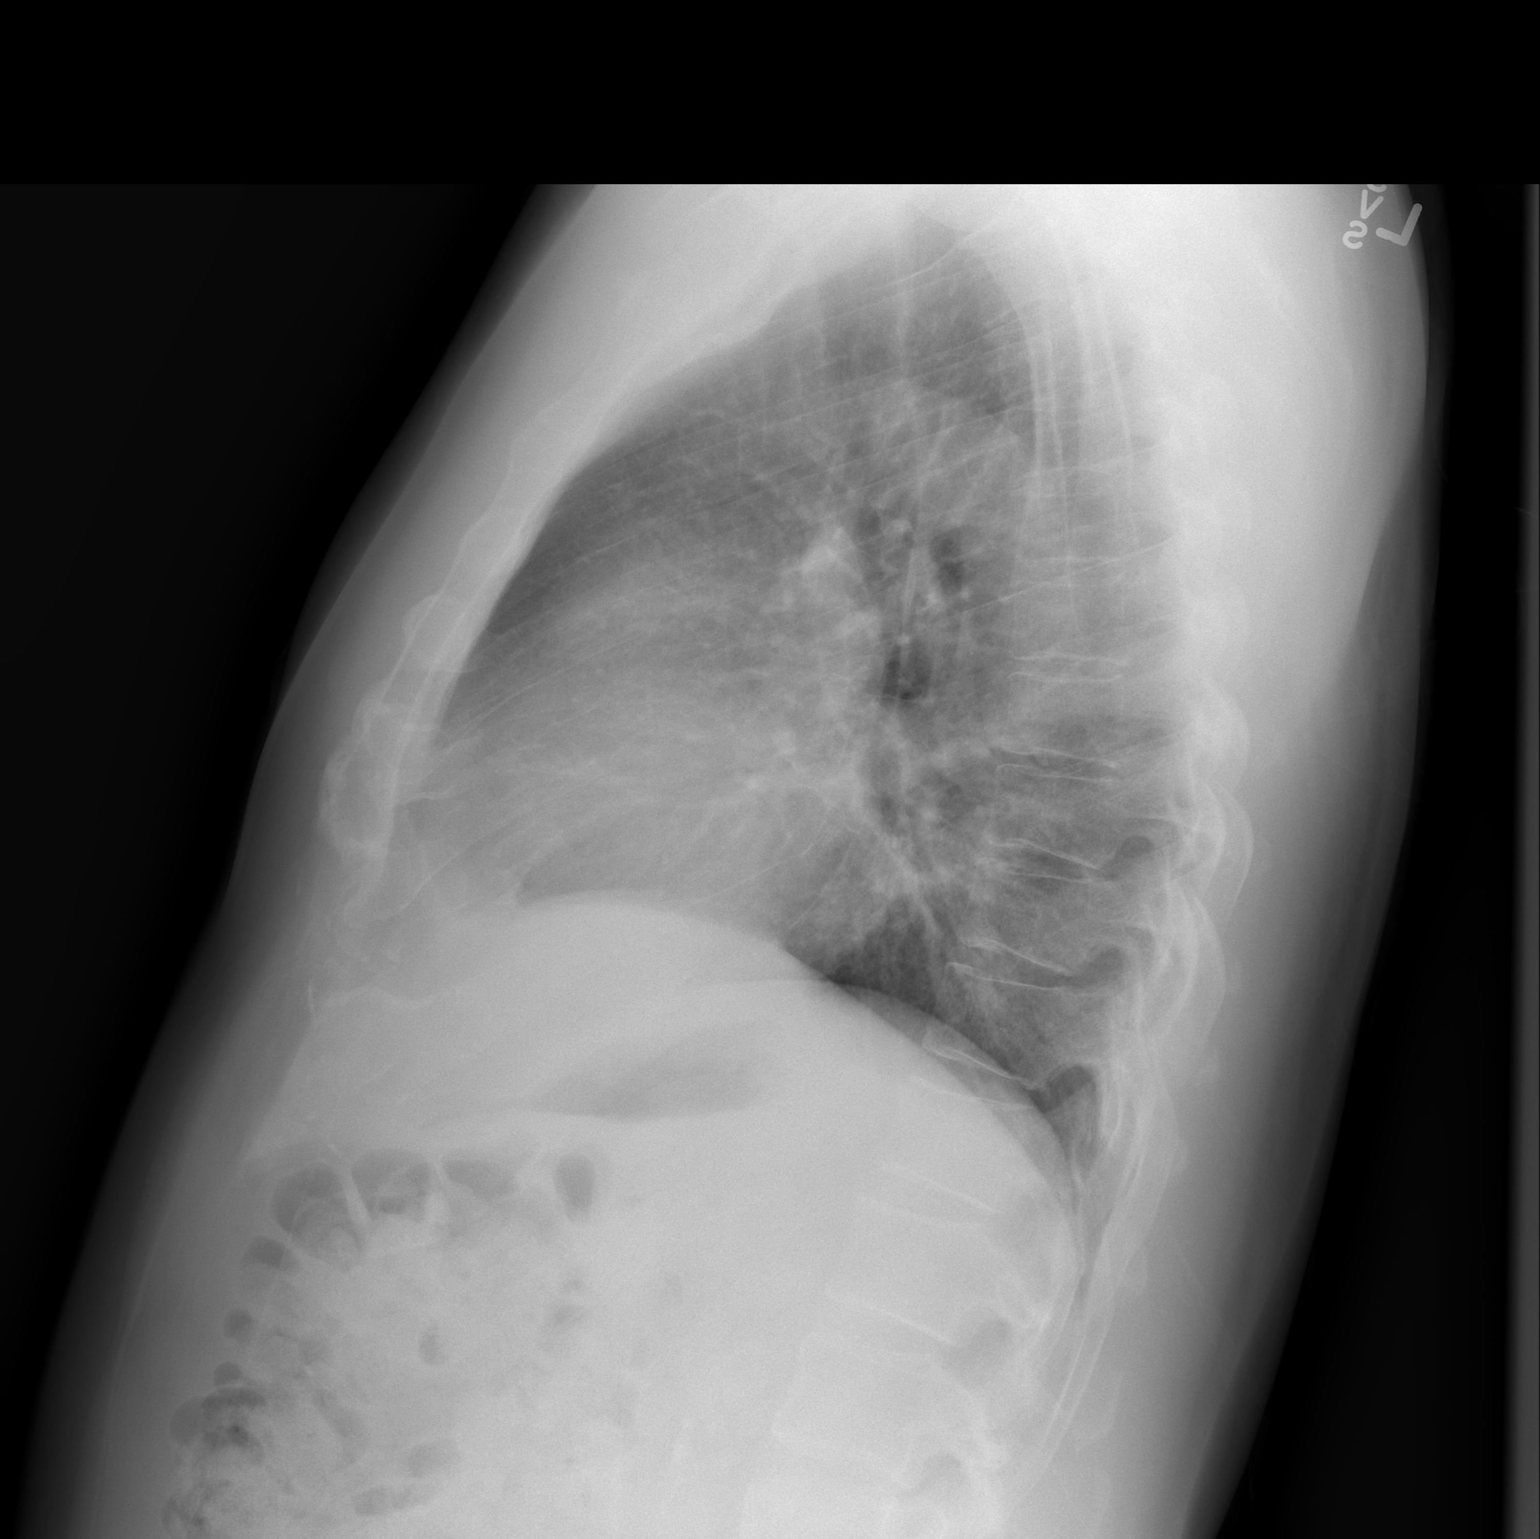

[2 of 2 positions shown; findings below may reference images not displayed]

FINDINGS: The lungs are well-expanded. The perihilar lung markings are mildly
prominent though stable. The cardiac silhouette is top-normal in
size. The central pulmonary vascularity is prominent but stable.
There is no pleural effusion. The bony thorax is unremarkable.
IMPRESSION: There is no acute cardiopulmonary disease.

## 2015-02-19 ENCOUNTER — Other Ambulatory Visit: Payer: Self-pay | Admitting: Internal Medicine

## 2015-02-19 MED ORDER — TRIAMCINOLONE ACETONIDE 0.1 % EX CREA: TOPICAL_CREAM | CUTANEOUS | Status: DC

## 2015-02-27 ENCOUNTER — Ambulatory Visit (INDEPENDENT_AMBULATORY_CARE_PROVIDER_SITE_OTHER): Payer: Self-pay | Admitting: Internal Medicine

## 2015-02-27 DIAGNOSIS — Z9119 Patient's noncompliance with other medical treatment and regimen: Secondary | ICD-10-CM

## 2015-02-27 NOTE — Progress Notes (Signed)
Patient ID: Caleb Johns, male   DOB: 11-17-1955, 60 y.o.   MRN: [id number] [name]

## 2015-03-05 ENCOUNTER — Ambulatory Visit (INDEPENDENT_AMBULATORY_CARE_PROVIDER_SITE_OTHER): Payer: Self-pay | Admitting: Internal Medicine

## 2015-03-05 DIAGNOSIS — Z9119 Patient's noncompliance with other medical treatment and regimen: Secondary | ICD-10-CM

## 2015-03-05 NOTE — Progress Notes (Signed)
Patient ID: Caleb Johns, male   DOB: 1955-04-18, 60 y.o.   MRN: [id number] C H R O N I C   N O.........S H O W

## 2015-03-11 ENCOUNTER — Other Ambulatory Visit: Payer: Self-pay | Admitting: Internal Medicine

## 2015-03-11 MED ORDER — ACYCLOVIR 800 MG PO TABS: 800.0000 mg | ORAL_TABLET | Freq: Every day | ORAL | Status: DC

## 2015-03-26 ENCOUNTER — Ambulatory Visit (INDEPENDENT_AMBULATORY_CARE_PROVIDER_SITE_OTHER): Payer: BLUE CROSS/BLUE SHIELD | Admitting: Internal Medicine

## 2015-03-26 ENCOUNTER — Encounter: Payer: Self-pay | Admitting: Internal Medicine

## 2015-03-26 VITALS — BP 128/86 | HR 54 | Temp 98.0°F | Resp 16 | Ht 68.0 in | Wt 200.0 lb

## 2015-03-26 DIAGNOSIS — Z0000 Encounter for general adult medical examination without abnormal findings: Secondary | ICD-10-CM

## 2015-03-26 DIAGNOSIS — R7303 Prediabetes: Secondary | ICD-10-CM

## 2015-03-26 DIAGNOSIS — E559 Vitamin D deficiency, unspecified: Secondary | ICD-10-CM

## 2015-03-26 DIAGNOSIS — Z130 Encounter for screening for diseases of the blood and blood-forming organs and certain disorders involving the immune mechanism: Secondary | ICD-10-CM

## 2015-03-26 DIAGNOSIS — E669 Obesity, unspecified: Secondary | ICD-10-CM

## 2015-03-26 DIAGNOSIS — I10 Essential (primary) hypertension: Secondary | ICD-10-CM

## 2015-03-26 DIAGNOSIS — Z79899 Other long term (current) drug therapy: Secondary | ICD-10-CM | POA: Diagnosis not present

## 2015-03-26 DIAGNOSIS — E349 Endocrine disorder, unspecified: Secondary | ICD-10-CM

## 2015-03-26 DIAGNOSIS — Z1212 Encounter for screening for malignant neoplasm of rectum: Secondary | ICD-10-CM

## 2015-03-26 DIAGNOSIS — E782 Mixed hyperlipidemia: Secondary | ICD-10-CM

## 2015-03-26 DIAGNOSIS — M1000 Idiopathic gout, unspecified site: Secondary | ICD-10-CM

## 2015-03-26 DIAGNOSIS — Z0001 Encounter for general adult medical examination with abnormal findings: Secondary | ICD-10-CM

## 2015-03-26 DIAGNOSIS — K219 Gastro-esophageal reflux disease without esophagitis: Secondary | ICD-10-CM

## 2015-03-26 LAB — CBC WITH DIFFERENTIAL/PLATELET
BASOS ABS: 0 10*3/uL (ref 0.0–0.1)
Basophils Relative: 0 % (ref 0–1)
Eosinophils Absolute: 0.2 10*3/uL (ref 0.0–0.7)
Eosinophils Relative: 4 % (ref 0–5)
HEMATOCRIT: 45.1 % (ref 39.0–52.0)
HEMOGLOBIN: 15.4 g/dL (ref 13.0–17.0)
LYMPHS PCT: 50 % — AB (ref 12–46)
Lymphs Abs: 2.5 10*3/uL (ref 0.7–4.0)
MCH: 30.6 pg (ref 26.0–34.0)
MCHC: 34.1 g/dL (ref 30.0–36.0)
MCV: 89.7 fL (ref 78.0–100.0)
MPV: 11.1 fL (ref 8.6–12.4)
Monocytes Absolute: 0.6 10*3/uL (ref 0.1–1.0)
Monocytes Relative: 11 % (ref 3–12)
NEUTROS ABS: 1.8 10*3/uL (ref 1.7–7.7)
Neutrophils Relative %: 35 % — ABNORMAL LOW (ref 43–77)
Platelets: 145 10*3/uL — ABNORMAL LOW (ref 150–400)
RBC: 5.03 MIL/uL (ref 4.22–5.81)
RDW: 14.1 % (ref 11.5–15.5)
WBC: 5 10*3/uL (ref 4.0–10.5)

## 2015-03-26 LAB — LIPID PANEL
Cholesterol: 153 mg/dL (ref 125–200)
HDL: 40 mg/dL (ref >=40)
LDL CALC: 90 mg/dL (ref <130)
Total CHOL/HDL Ratio: 3.8 Ratio (ref <=5.0)
Triglycerides: 114 mg/dL (ref <150)
VLDL: 23 mg/dL (ref <30)

## 2015-03-26 LAB — BASIC METABOLIC PANEL WITH GFR
BUN: 15 mg/dL (ref 7–25)
CALCIUM: 9.9 mg/dL (ref 8.6–10.3)
CO2: 30 mmol/L (ref 20–31)
Chloride: 99 mmol/L (ref 98–110)
Creat: 0.95 mg/dL (ref 0.70–1.33)
GFR, EST NON AFRICAN AMERICAN: 87 mL/min (ref >=60)
GFR, Est African American: >89 mL/min (ref >=60)
GLUCOSE: 93 mg/dL (ref 65–99)
POTASSIUM: 3.8 mmol/L (ref 3.5–5.3)
Sodium: 139 mmol/L (ref 135–146)

## 2015-03-26 LAB — HEPATIC FUNCTION PANEL
ALBUMIN: 4.1 g/dL (ref 3.6–5.1)
ALK PHOS: 63 U/L (ref 40–115)
ALT: 14 U/L (ref 9–46)
AST: 26 U/L (ref 10–35)
BILIRUBIN INDIRECT: 0.6 mg/dL (ref 0.2–1.2)
Bilirubin, Direct: 0.2 mg/dL (ref <=0.2)
TOTAL PROTEIN: 7 g/dL (ref 6.1–8.1)
Total Bilirubin: 0.8 mg/dL (ref 0.2–1.2)

## 2015-03-26 LAB — IRON AND TIBC
%SAT: 22 % (ref 15–60)
IRON: 69 ug/dL (ref 50–180)
TIBC: 311 ug/dL (ref 250–425)
UIBC: 242 ug/dL (ref 125–400)

## 2015-03-26 LAB — MAGNESIUM: Magnesium: 2.1 mg/dL (ref 1.5–2.5)

## 2015-03-26 MED ORDER — PREDNISONE 20 MG PO TABS
ORAL_TABLET | ORAL | Status: DC
Start: 2015-03-26 — End: 2015-04-17

## 2015-03-26 MED ORDER — LORATADINE 10 MG PO TABS: 10.0000 mg | ORAL_TABLET | Freq: Every day | ORAL | Status: DC

## 2015-03-26 NOTE — Progress Notes (Signed)
Patient ID: Caleb Johns, male   DOB: [birth year], 59 y.o.   MRN: [id number]  Complete Physical  Assessment and Plan:   1. Essential hypertension  - Urinalysis, Routine w reflex microscopic (not at [facility]) - Microalbumin / creatinine urine ratio - EKG 12-Lead - [place], RETROPERITNL ABD,  LTD - TSH  2. Hyperlipidemia  - Lipid panel  3. Prediabetes - Hemoglobin A1c - Insulin, random  4. Vitamin D deficiency - VITAMIN D 25 Hydroxy (Vit-D Deficiency, Fractures)  5. Medication management - CBC with Differential/Platelet - BASIC METABOLIC PANEL WITH GFR - Hepatic function panel - Magnesium  6. Gastroesophageal reflux disease, esophagitis presence not specified -well controlled  7. Testosterone deficiency -declines testosterone currently  8. Obesity  9. Idiopathic gout, unspecified chronicity, unspecified site -cont allopurinol  10. Encounter for general adult medical examination with abnormal findings   11. Screening for deficiency anemia - Iron and TIBC - Vitamin B12  12. Screening for rectal cancer - POC Hemoccult Bld/Stl (3-Cd Home Screen); Future  13.  Sciatica right leg -prednisone -exam unconcerning  Discussed med's effects and SE's. Screening labs and tests as requested with regular follow-up as recommended.  HPI Patient presents for a complete physical.   His blood pressure has been controlled at home, today their BP is BP: 128/86 mmHg He does workout. He denies chest pain, shortness of breath, dizziness.   He is on cholesterol medication and denies myalgias. His cholesterol is at goal. The cholesterol last visit was:   Lab Results  Component Value Date   CHOL 158 12/12/2014   HDL 44 12/12/2014   LDLCALC 95 12/12/2014   TRIG 93 12/12/2014   CHOLHDL 3.6 12/12/2014    He has been working on diet and exercise for prediabetes, he is on bASA, he is not on ACE/ARB and denies foot ulcerations, hyperglycemia, hypoglycemia , increased appetite, nausea,  paresthesia of the feet, polydipsia, polyuria, visual disturbances, vomiting and weight loss. Last A1C in the office was:  Lab Results  Component Value Date   HGBA1C 5.7* 12/12/2014    Patient is on Vitamin D supplement.   Lab Results  Component Value Date   VD25OH 45 12/12/2014      Last PSA was: Lab Results  Component Value Date   PSA 1.80 02/13/2014  .  Denies BPH symptoms daytime frequency, double voiding, dysuria, hematuria, hesitancy, incontinence, intermittency, nocturia, sensation of incomplete bladder emptying, suprapubic pain, urgency or weak urinary stream.  He reports that he has been having some pain shooting down the left leg that has been going on for the last couple months.  He reports that sometimes he barely feels like he can walk.    He reports that he is having some issues with rash on his hands.  He feels like the kenalog isn't helping much.  He reports that it only comes during the winter time.    Current Medications:  Current Outpatient Prescriptions on File Prior to Visit  Medication Sig Dispense Refill  . acyclovir (ZOVIRAX) 800 MG tablet Take 1 tablet (800 mg total) by mouth daily. 90 tablet 1  . allopurinol (ZYLOPRIM) 300 MG tablet TAKE ONE TABLET BY MOUTH ONCE DAILY FOR  GOUT 90 tablet 1  . alum & mag hydroxide-simeth (MAALOX/MYLANTA) [id number] MG/5ML suspension Take 30 mLs by mouth 2 (two) times daily.    Marland Kitchen aspirin EC 81 MG tablet Take 81 mg by mouth daily.    Marland Kitchen atenolol (TENORMIN) 100 MG tablet TAKE ONE TABLET BY MOUTH  ONCE DAILY 90 tablet 3  . B Complex-C (SUPER B COMPLEX PO) Take 1 tablet by mouth daily.    . Cholecalciferol (VITAMIN D) 2000 UNITS CAPS Take 2,000 Units by mouth daily.    . Flaxseed, Linseed, (FLAX SEED OIL) 1000 MG CAPS Take 1,000 mg by mouth daily.    Marland Kitchen glucose blood (FREESTYLE TEST STRIPS) test strip Use as instructed, DX 250.00 100 each 12  . hydrochlorothiazide (HYDRODIURIL) 25 MG tablet TAKE ONE TABLET BY MOUTH ONCE DAILY 90  tablet 1  . Lancets (FREESTYLE) lancets   12  . meloxicam (MOBIC) 15 MG tablet Take 1 tablet (15 mg total) by mouth daily. 90 tablet 4  . Omega-3 Fatty Acids (FISH OIL) 1000 MG CAPS Take 1,000 mg by mouth daily.    . pravastatin (PRAVACHOL) 40 MG tablet TAKE ONE TABLET BY MOUTH ONCE DAILY 90 tablet 1  . ranitidine (ZANTAC) 300 MG tablet TAKE ONE TABLET BY MOUTH TWICE DAILY 180 tablet 1  . tadalafil (CIALIS) 20 MG tablet Take 1/2 to 1 tablet every  2 to 3 tablets as needed for XXXX 100 tablet 99  . triamcinolone cream (KENALOG) 0.1 % Apply to rash 3 to 4 x daily if needed 80 g 3   No current facility-administered medications on file prior to visit.    Health Maintenance:  Immunization History  Administered Date(s) Administered  . PPD Test 02/13/2014  . Pneumococcal Polysaccharide-23 02/23/2002  . Tdap 05/15/2009    Tetanus: 2011 Pneumovax: 2004 Flu vaccine: 2016 Colonoscopy:  2008   Patient Care Team: Unk Pinto, MD as PCP - General (Internal Medicine)  Allergies:  Allergies  Allergen Reactions  . Ace Inhibitors     REACTION: cough  . Trilipix [Choline Fenofibrate] Other (See Comments)    Heart burn    Medical History:  Past Medical History  Diagnosis Date  . Hypertension   . Hyperlipidemia   . Obese   . Pre-diabetes   . Hypogonadism male   . Gout   . HSV-1 (herpes simplex virus 1) infection   . HSV-2 (herpes simplex virus 2) infection   . GERD (gastroesophageal reflux disease)   . Pneumonia     walking -many years ago    Surgical History:  Past Surgical History  Procedure Laterality Date  . Back surgery  2012    on back-hernia  . Laparoscopic inguinal hernia with umbilical hernia Right [id number]    Procedure: LAPAROSCOPIC RIGH  INGUINAL HERNIA WITH OPEN UMBILICAL HERNIA;  Surgeon: Odis Hollingshead, MD;  Location: [facility];  Service: General;  Laterality: Right;  umbilical repair with mesh  . Insertion of mesh N/A 08/15/2013    Procedure: INSERTION OF  MESH;  Surgeon: Odis Hollingshead, MD;  Location: [facility];  Service: General;  Laterality: N/A;  . Hernia repair      Family History:  Family History  Problem Relation Age of Onset  . Diabetes Mother   . Hypertension Mother   . Hypertension Brother   . Hypertension Brother   . Hypertension Brother     Social History:   Social History  Substance Use Topics  . Smoking status: Former Smoker    Quit date: 02/23/2002  . Smokeless tobacco: None  . Alcohol Use: No    Review of Systems:  Review of Systems  Constitutional: Negative for fever, chills and malaise/fatigue.  HENT: Negative for congestion, ear pain and sore throat.   Eyes: Negative.   Respiratory: Negative for cough, shortness of  breath and wheezing.   Cardiovascular: Negative for chest pain, palpitations and leg swelling.  Gastrointestinal: Negative for heartburn, diarrhea, constipation, blood in stool and melena.  Genitourinary: Negative.   Neurological: Negative for dizziness, sensory change, loss of consciousness and headaches.  Psychiatric/Behavioral: Negative for depression. The patient is not nervous/anxious and does not have insomnia.     Physical Exam: Estimated body mass index is 30.42 kg/(m^2) as calculated from the following:   Height as of this encounter: 5\' 8"  (1.727 m).   Weight as of this encounter: 200 lb (90.719 kg). BP 128/86 mmHg  Pulse 54  Temp(Src) 98 F (36.7 C) (Temporal)  Resp 16  Ht 5\' 8"  (1.727 m)  Wt 200 lb (90.719 kg)  BMI 30.42 kg/m2  General Appearance: Well nourished, in no apparent distress.  Eyes: PERRLA, EOMs, conjunctiva no swelling or erythema ENT/Mouth: Ear canals clear bilaterally with no erythema, swelling, discharge.  TMs normal bilaterally with no erythema, bulging, or retractions.  Oropharynx clear and moist with no exudate, swelling, or erythema.  Dentition normal.   Neck: Supple, thyroid normal. No bruits, JVD, cervical adenopathy Respiratory: Respiratory effort  normal, BS equal bilaterally without rales, rhonchi, wheezing or stridor.  Cardio: RRR without murmurs, rubs or gallops. Brisk peripheral pulses without edema.  Chest: symmetric, with normal excursions Abdomen: Soft, nontender, no guarding, rebound, hernias, masses, or organomegaly. Musculoskeletal: Full ROM all peripheral extremities,5/5 strength, and normal gait.  Skin: Warm, dry, eczematous rash on skin espeically on hands, lesions, ecchymosis. Neuro: A&Ox3, Cranial nerves intact, reflexes equal bilaterally. Normal muscle tone, no cerebellar symptoms. Sensation intact.  Psych: Normal affect, Insight and Judgment appropriate.   EKG: WNL no changes.  AORTA SCAN: WNL  Over 40 minutes of exam, counseling, chart review and critical decision making was performed  Starlyn Skeans 3:27 PM [facility]

## 2015-03-27 LAB — URINALYSIS, ROUTINE W REFLEX MICROSCOPIC
Bilirubin Urine: NEGATIVE
Glucose, UA: NEGATIVE
Hgb urine dipstick: NEGATIVE
Ketones, ur: NEGATIVE
LEUKOCYTES UA: NEGATIVE
NITRITE: NEGATIVE
PH: 7 (ref 5.0–8.0)
Protein, ur: NEGATIVE
SPECIFIC GRAVITY, URINE: 1.014 (ref 1.001–1.035)

## 2015-03-27 LAB — MICROALBUMIN / CREATININE URINE RATIO
Creatinine, Urine: 98 mg/dL (ref 20–370)
Microalb, Ur: <0.2 mg/dL

## 2015-03-27 LAB — VITAMIN D 25 HYDROXY (VIT D DEFICIENCY, FRACTURES): VIT D 25 HYDROXY: 36 ng/mL (ref 30–100)

## 2015-03-27 LAB — TSH: TSH: 1.413 u[IU]/mL (ref 0.350–4.500)

## 2015-03-27 LAB — HEMOGLOBIN A1C
Hgb A1c MFr Bld: 5.7 % — ABNORMAL HIGH (ref <5.7)
MEAN PLASMA GLUCOSE: 117 mg/dL — AB (ref <117)

## 2015-03-27 LAB — INSULIN, RANDOM: INSULIN: 7.1 u[IU]/mL (ref 2.0–19.6)

## 2015-03-27 LAB — VITAMIN B12: Vitamin B-12: 784 pg/mL (ref 211–911)

## 2015-04-17 ENCOUNTER — Encounter: Payer: Self-pay | Admitting: Internal Medicine

## 2015-04-17 ENCOUNTER — Ambulatory Visit (INDEPENDENT_AMBULATORY_CARE_PROVIDER_SITE_OTHER): Payer: BLUE CROSS/BLUE SHIELD | Admitting: Internal Medicine

## 2015-04-17 ENCOUNTER — Ambulatory Visit: Payer: Self-pay | Admitting: Internal Medicine

## 2015-04-17 VITALS — BP 112/80 | HR 60 | Temp 97.2°F | Resp 16 | Ht 68.25 in | Wt 192.6 lb

## 2015-04-17 DIAGNOSIS — B356 Tinea cruris: Secondary | ICD-10-CM

## 2015-04-17 DIAGNOSIS — J0140 Acute pansinusitis, unspecified: Secondary | ICD-10-CM

## 2015-04-17 DIAGNOSIS — J302 Other seasonal allergic rhinitis: Secondary | ICD-10-CM

## 2015-04-17 MED ORDER — PSEUDOEPHEDRINE HCL ER 120 MG PO TB12: ORAL_TABLET | ORAL | Status: DC

## 2015-04-17 MED ORDER — LORATADINE 10 MG PO TABS: ORAL_TABLET | ORAL | Status: DC

## 2015-04-17 MED ORDER — AZITHROMYCIN 250 MG PO TABS: ORAL_TABLET | ORAL | Status: DC

## 2015-04-17 MED ORDER — PREDNISONE 20 MG PO TABS: ORAL_TABLET | ORAL | Status: DC

## 2015-04-17 MED ORDER — FLUCONAZOLE 150 MG PO TABS: ORAL_TABLET | ORAL | Status: DC

## 2015-04-17 NOTE — Progress Notes (Signed)
Subjective:    Patient ID: Caleb Johns, male    DOB: 1955-07-24, 60 y.o.   MRN: [id number]  HPI  Patient presents with c/o HA, sinus pressure & congestion and yellowish green nasal secretions. He is also c/o rash in his bilateral groins.   Medication Sig  . acyclovir  800 MG tablet Take 1 tablet (800 mg total) by mouth daily.  Marland Kitchen allopurinol  300 MG tablet TAKE ONE TABLET BY MOUTH ONCE DAILY FOR  GOUT  . aspirin EC 81 MG tablet Take 81 mg by mouth daily.  Marland Kitchen atenolol  100 MG tablet TAKE ONE TABLET BY MOUTH ONCE DAILY  . B Complex-C  Take 1 tablet by mouth daily.  Marland Kitchen VITAMIN D 2000 UNITS  Take 2,000 Units by mouth daily.  Marland Kitchen FLAX SEED OIL 1000 MG  Take 1,000 mg by mouth daily.  . hctz 25 MG tablet TAKE ONE TABLET BY MOUTH ONCE DAILY  . Omega-3 FISH OIL 1000 MG  Take 1,000 mg by mouth daily.  . pravastatin  40 MGt TAKE ONE TABLET BY MOUTH ONCE DAILY  . ranitidine  300 MG tablet TAKE ONE TABLET BY MOUTH TWICE DAILY  . tadalafil 20 MG tablet Take 1/2 to 1 tablet every  2 to 3 tablets as needed for XXXX  . triamcinolone cream  0.1 % Apply to rash 3 to 4 x daily if needed  . loratadine  10 MG tablet Take 1 tablet (10 mg total) by mouth daily.  . meloxicam  15 MG tablet Take 1 tablet (15 mg total) by mouth daily.   Allergies  Allergen Reactions  . Ace Inhibitors     REACTION: cough  . Trilipix [Choline Fenofibrate] Other (See Comments)    Heart burn   Past Medical History  Diagnosis Date  . Hypertension   . Hyperlipidemia   . Obese   . Pre-diabetes   . Hypogonadism male   . Gout   . HSV-1 (herpes simplex virus 1) infection   . HSV-2 (herpes simplex virus 2) infection   . GERD (gastroesophageal reflux disease)   . Pneumonia     walking -many years ago   Review of Systems 10 point systems review negative except as above.    Objective:   Physical Exam  BP 112/80 mmHg  Pulse 60  Temp(Src) 97.2 F (36.2 C)  Resp 16  Ht 5' 8.25" (1.734 m)  Wt 192 lb 9.6 oz (87.363 kg)  BMI  29.06 kg/m2  HEENT - Eac's patent. TM's Nl. EOM's full. PERRLA. (+) tender frontal/maxillary areas.  NasoOroPharynx clear. Neck - supple. Nl Thyroid. Carotids 2+ & No bruits, nodes, JVD Chest - Clear equal BS w/o Rales, rhonchi, wheezes. Cor - Nl HS. RRR w/o sig MGR. PP 1(+). No edema. MS- FROM w/o deformities. Muscle power, tone and bulk Nl. Gait Nl. Neuro - No obvious Cr N abnormalities. Sensory, motor and Cerebellar functions appear Nl w/o focal abnormalities. Psyche - Mental status normal & appropriate.  No delusions, ideations or obvious mood abnormalities. Skin - appears to have tinea cruris of groins bilaterally    Assessment & Plan:   1. Acute pansinusitis, recurrence not specified  - predniSONE (DELTASONE) 20 MG tablet; 1 tab 3 x day for 3 days, then 1 tab 2 x day for 3 days, then 1 tab 1 x day for 5 days  Dispense: 20 tablet; Refill: 0 - azithromycin (ZITHROMAX) 250 MG tablet; Take 2 tablets (500 mg) on  Day 1,  followed by 1 tablet (250 mg) once daily on Days 2 through 5.  Dispense: 6 each; Refill: 1  2. Tinea cruris  - fluconazole (DIFLUCAN) 150 MG tablet; Take 1 tablet weekly for 4 weeks  Dispense: 4 tablet; Refill: 1  3. Seasonal allergies  - loratadine (CLARITIN) 10 MG tablet; Take 1 tablet daily for allergies  Dispense: 30 tablet; Refill: 99 - pseudoephedrine (SUDAFED) 120 MG 12 hr tablet; Take 1 tablet 2 x day if needed for congestion  Dispense: 60 tablet; Refill: 11

## 2015-05-14 ENCOUNTER — Telehealth: Payer: Self-pay | Admitting: *Deleted

## 2015-05-14 MED ORDER — PREDNISONE 10 MG PO TABS: ORAL_TABLET | ORAL | Status: DC

## 2015-05-14 NOTE — Telephone Encounter (Signed)
Patient called and requested an RX for Prednisone due to leg pain.  Per Dr Melford Aase, send in an RX for Prednisone 10 mg tabs.

## 2015-06-13 ENCOUNTER — Other Ambulatory Visit: Payer: Self-pay | Admitting: *Deleted

## 2015-06-13 ENCOUNTER — Other Ambulatory Visit: Payer: Self-pay | Admitting: Internal Medicine

## 2015-06-13 MED ORDER — SILDENAFIL CITRATE 100 MG PO TABS: 100.0000 mg | ORAL_TABLET | ORAL | Status: DC | PRN

## 2015-06-13 MED ORDER — SILDENAFIL CITRATE 20 MG PO TABS: ORAL_TABLET | ORAL | Status: DC

## 2015-06-17 ENCOUNTER — Other Ambulatory Visit: Payer: Self-pay | Admitting: Internal Medicine

## 2015-06-18 ENCOUNTER — Other Ambulatory Visit: Payer: Self-pay | Admitting: Internal Medicine

## 2015-06-18 DIAGNOSIS — M1000 Idiopathic gout, unspecified site: Secondary | ICD-10-CM

## 2015-06-18 DIAGNOSIS — I10 Essential (primary) hypertension: Secondary | ICD-10-CM

## 2015-06-18 MED ORDER — ATENOLOL 100 MG PO TABS: ORAL_TABLET | ORAL | Status: DC

## 2015-06-18 MED ORDER — HYDROCHLOROTHIAZIDE 25 MG PO TABS: ORAL_TABLET | ORAL | Status: DC

## 2015-06-18 MED ORDER — ALLOPURINOL 300 MG PO TABS: ORAL_TABLET | ORAL | Status: DC

## 2015-07-03 ENCOUNTER — Ambulatory Visit (INDEPENDENT_AMBULATORY_CARE_PROVIDER_SITE_OTHER): Payer: BLUE CROSS/BLUE SHIELD | Admitting: Internal Medicine

## 2015-07-03 ENCOUNTER — Encounter: Payer: Self-pay | Admitting: Internal Medicine

## 2015-07-03 VITALS — BP 134/72 | HR 81 | Temp 97.7°F | Resp 16 | Ht 68.25 in | Wt 198.8 lb

## 2015-07-03 DIAGNOSIS — M5442 Lumbago with sciatica, left side: Secondary | ICD-10-CM

## 2015-07-03 DIAGNOSIS — N400 Enlarged prostate without lower urinary tract symptoms: Secondary | ICD-10-CM | POA: Diagnosis not present

## 2015-07-03 MED ORDER — TAMSULOSIN HCL 0.4 MG PO CAPS: 0.4000 mg | ORAL_CAPSULE | Freq: Every day | ORAL | Status: DC

## 2015-07-03 MED ORDER — PREGABALIN 50 MG PO CAPS: 50.0000 mg | ORAL_CAPSULE | Freq: Three times a day (TID) | ORAL | Status: DC

## 2015-07-03 NOTE — Patient Instructions (Signed)
Pregabalin capsules What is this medicine? PREGABALIN (pre GAB a lin) is used to treat nerve pain from diabetes, shingles, spinal cord injury, and fibromyalgia. It is also used to control seizures in epilepsy. This medicine may be used for other purposes; ask your health care provider or pharmacist if you have questions. What should I tell my health care provider before I take this medicine? They need to know if you have any of these conditions: -bleeding problems -heart disease, including heart failure -history of alcohol or drug abuse -kidney disease -suicidal thoughts, plans, or attempt; a previous suicide attempt by you or a family member -an unusual or allergic reaction to pregabalin, gabapentin, other medicines, foods, dyes, or preservatives -pregnant or trying to get pregnant or trying to conceive with your partner -breast-feeding How should I use this medicine? Take this medicine by mouth with a glass of water. Follow the directions on the prescription label. You can take this medicine with or without food. Take your doses at regular intervals. Do not take your medicine more often than directed. Do not stop taking except on your doctor's advice. A special MedGuide will be given to you by the pharmacist with each prescription and refill. Be sure to read this information carefully each time. Talk to your pediatrician regarding the use of this medicine in children. Special care may be needed. Overdosage: If you think you have taken too much of this medicine contact a poison control center or emergency room at once. NOTE: This medicine is only for you. Do not share this medicine with others. What if I miss a dose? If you miss a dose, take it as soon as you can. If it is almost time for your next dose, take only that dose. Do not take double or extra doses. What may interact with this medicine? -alcohol -certain medicines for blood pressure like captopril, enalapril, or  lisinopril -certain medicines for diabetes, like pioglitazone or rosiglitazone -certain medicines for anxiety or sleep -narcotic medicines for pain This list may not describe all possible interactions. Give your health care provider a list of all the medicines, herbs, non-prescription drugs, or dietary supplements you use. Also tell them if you smoke, drink alcohol, or use illegal drugs. Some items may interact with your medicine. What should I watch for while using this medicine? Tell your doctor or healthcare professional if your symptoms do not start to get better or if they get worse. Visit your doctor or health care professional for regular checks on your progress. Do not stop taking except on your doctor's advice. You may develop a severe reaction. Your doctor will tell you how much medicine to take. Wear a medical identification bracelet or chain if you are taking this medicine for seizures, and carry a card that describes your disease and details of your medicine and dosage times. You may get drowsy or dizzy. Do not drive, use machinery, or do anything that needs mental alertness until you know how this medicine affects you. Do not stand or sit up quickly, especially if you are an older patient. This reduces the risk of dizzy or fainting spells. Alcohol may interfere with the effect of this medicine. Avoid alcoholic drinks. If you have a heart condition, like congestive heart failure, and notice that you are retaining water and have swelling in your hands or feet, contact your health care provider immediately. The use of this medicine may increase the chance of suicidal thoughts or actions. Pay special attention to how you are   responding while on this medicine. Any worsening of mood, or thoughts of suicide or dying should be reported to your health care professional right away. This medicine has caused reduced sperm counts in some men. This may interfere with the ability to father a child. You  should talk to your doctor or health care professional if you are concerned about your fertility. Women who become pregnant while using this medicine for seizures may enroll in the [place] Antiepileptic Drug Pregnancy Registry by calling 1-888-233-2334. This registry collects information about the safety of antiepileptic drug use during pregnancy. What side effects may I notice from receiving this medicine? Side effects that you should report to your doctor or health care professional as soon as possible: -allergic reactions like skin rash, itching or hives, swelling of the face, lips, or tongue -breathing problems -changes in vision -chest pain -confusion -jerking or unusual movements of any part of your body -loss of memory -muscle pain, tenderness, or weakness -suicidal thoughts or other mood changes -swelling of the ankles, feet, hands -unusual bruising or bleeding Side effects that usually do not require medical attention (Report these to your doctor or health care professional if they continue or are bothersome.): -dizziness -drowsiness -dry mouth -headache -nausea -tremors -trouble sleeping -weight gain This list may not describe all possible side effects. Call your doctor for medical advice about side effects. You may report side effects to FDA at 1-800-FDA-1088. Where should I keep my medicine? Keep out of the reach of children. This medicine can be abused. Keep your medicine in a safe place to protect it from theft. Do not share this medicine with anyone. Selling or giving away this medicine is dangerous and against the law. This medicine may cause accidental overdose and death if it taken by other adults, children, or pets. Mix any unused medicine with a substance like cat litter or coffee grounds. Then throw the medicine away in a sealed container like a sealed bag or a coffee can with a lid. Do not use the medicine after the expiration date. Store at room temperature  between 15 and 30 degrees C (59 and 86 degrees F). NOTE: This sheet is a summary. It may not cover all possible information. If you have questions about this medicine, talk to your doctor, pharmacist, or health care provider.    2016, Elsevier/Gold Standard. (2013-04-07 15:38:53)  

## 2015-07-03 NOTE — Progress Notes (Signed)
Subjective:    Patient ID: Caleb Johns, male    DOB: 1955-04-28, 60 y.o.   MRN: [id number]  Back Pain Pertinent negatives include no abdominal pain, chest pain or fever.   Patient presents to the office for evaluation for left sided leg pain and sometimes left lower back pain.  He reports that this has been bothering him intermittently since January.  He thinks that the original injury was he reached forward to grab a falling object and lifted it up and that was when everything started.  He notes that the left leg pain sometimes wakes him up at night time.  He has a history of a cyst removal on the right upper thoracic spine.  He has no history of lumbar back pain.  He reports some electric shock like feeling down the left leg.  Originally aleve helped but now its not helping as much.  He denies numbness and weakness in the left leg.  He denies the pain reaching his toes and stops mid lateral leg.  He reports that he does not have any urinary symptoms.  NO burning.  He does feel like he is urinating a little bit more often than usual.  No saddle anesthesia, no loss of bowel or bladder, no IV drug use,  No previous back surgeries, no history of cancer.      Review of Systems  Constitutional: Negative for fever, chills and fatigue.  Respiratory: Negative for chest tightness and shortness of breath.   Cardiovascular: Negative for chest pain and palpitations.  Gastrointestinal: Negative for nausea, vomiting, abdominal pain, diarrhea, constipation and blood in stool.  Musculoskeletal: Positive for back pain.       Objective:   Physical Exam  Constitutional: He is oriented to person, place, and time. He appears well-developed and well-nourished. No distress.  HENT:  Head: Normocephalic.  Mouth/Throat: Oropharynx is clear and moist. No oropharyngeal exudate.  Eyes: Conjunctivae are normal. No scleral icterus.  Neck: Normal range of motion. Neck supple. No JVD present. No thyromegaly present.   Cardiovascular: Normal rate, regular rhythm, normal heart sounds and intact distal pulses.  Exam reveals no gallop and no friction rub.   No murmur heard. Pulmonary/Chest: Effort normal and breath sounds normal. No respiratory distress. He has no wheezes. He has no rales. He exhibits no tenderness.  Abdominal: Soft. Bowel sounds are normal. He exhibits no distension and no mass. There is no tenderness. There is no rebound and no guarding.  Musculoskeletal:  Patient rises slowly from sitting to standing.  They walk without an antalgic gait.  There is no evidence of erythema, ecchymosis, or gross deformity.  There is tenderness to palpation over the left buttock.  There is no bony tenderness to palpation.  Active ROM is but painful with extension of the back.  Sensation to light touch is intact over all extremities.  Strength is symmetric and equal in all extremities.   Lymphadenopathy:    He has no cervical adenopathy.  Neurological: He is alert and oriented to person, place, and time.  Skin: Skin is warm and dry. He is not diaphoretic.  Psychiatric: He has a normal mood and affect. His behavior is normal. Judgment and thought content normal.  Nursing note and vitals reviewed.   Filed Vitals:   07/03/15 1636  BP: 134/72  Pulse: 81  Temp: 97.7 F (36.5 C)  Resp: 16         Assessment & Plan:    1. Left-sided low back  pain with left-sided sciatica -no red flags - MR Lumbar Spine Wo Contrast; Future - Ambulatory referral to Orthopedics - pregabalin (LYRICA) 50 MG capsule; Take 1 capsule (50 mg total) by mouth 3 (three) times daily.  Dispense: 90 capsule; Refill: 2  2. BPH (benign prostatic hyperplasia)  - tamsulosin (FLOMAX) 0.4 MG CAPS capsule; Take 1 capsule (0.4 mg total) by mouth daily after supper.  Dispense: 30 capsule; Refill: 1

## 2015-07-08 ENCOUNTER — Telehealth: Payer: Self-pay | Admitting: *Deleted

## 2015-07-08 NOTE — Telephone Encounter (Signed)
Patient was seen 07/03/15 with c/o back pain.  Patient was given Lyrica 50 mg samples with instructions to take 50 mg BID.  Patient called back with c/o Lyrica not helping with pain symptoms.  Per Starlyn Skeans, PA-C orders, patient was called and advised to increase dose to 2 pills= 100 mg BID.  Patient states he "already has been doing that for a minute."  Apparently patient took it upon himself to increase dose to 100 mg BID already.  Per Starlyn Skeans, PA-C orders, patient was advised to continue taking Lyrica 100 mg BID and will need to wait on imaging for further E/T before any additional pain meds will be given to patient.  Patient expressed understanding.

## 2015-07-16 ENCOUNTER — Other Ambulatory Visit: Payer: Self-pay

## 2015-08-05 ENCOUNTER — Other Ambulatory Visit: Payer: Self-pay | Admitting: Internal Medicine

## 2015-08-06 ENCOUNTER — Other Ambulatory Visit: Payer: Self-pay | Admitting: *Deleted

## 2015-08-06 MED ORDER — RANITIDINE HCL 300 MG PO TABS: 300.0000 mg | ORAL_TABLET | Freq: Two times a day (BID) | ORAL | Status: DC

## 2015-09-25 ENCOUNTER — Ambulatory Visit (INDEPENDENT_AMBULATORY_CARE_PROVIDER_SITE_OTHER): Payer: BLUE CROSS/BLUE SHIELD | Admitting: Internal Medicine

## 2015-09-25 ENCOUNTER — Encounter: Payer: Self-pay | Admitting: Internal Medicine

## 2015-09-25 VITALS — BP 120/82 | HR 60 | Temp 97.5°F | Resp 16 | Ht 68.25 in | Wt 196.6 lb

## 2015-09-25 DIAGNOSIS — E559 Vitamin D deficiency, unspecified: Secondary | ICD-10-CM | POA: Diagnosis not present

## 2015-09-25 DIAGNOSIS — R7303 Prediabetes: Secondary | ICD-10-CM

## 2015-09-25 DIAGNOSIS — E291 Testicular hypofunction: Secondary | ICD-10-CM

## 2015-09-25 DIAGNOSIS — Z79899 Other long term (current) drug therapy: Secondary | ICD-10-CM | POA: Diagnosis not present

## 2015-09-25 DIAGNOSIS — I10 Essential (primary) hypertension: Secondary | ICD-10-CM | POA: Diagnosis not present

## 2015-09-25 DIAGNOSIS — K219 Gastro-esophageal reflux disease without esophagitis: Secondary | ICD-10-CM | POA: Diagnosis not present

## 2015-09-25 DIAGNOSIS — M1000 Idiopathic gout, unspecified site: Secondary | ICD-10-CM | POA: Diagnosis not present

## 2015-09-25 DIAGNOSIS — E782 Mixed hyperlipidemia: Secondary | ICD-10-CM | POA: Diagnosis not present

## 2015-09-25 DIAGNOSIS — E349 Endocrine disorder, unspecified: Secondary | ICD-10-CM

## 2015-09-25 LAB — CBC WITH DIFFERENTIAL/PLATELET
BASOS ABS: 50 {cells}/uL (ref 0–200)
Basophils Relative: 1 %
Eosinophils Absolute: 350 cells/uL (ref 15–500)
Eosinophils Relative: 7 %
HEMATOCRIT: 43.4 % (ref 38.5–50.0)
HEMOGLOBIN: 14.6 g/dL (ref 13.2–17.1)
LYMPHS PCT: 46 %
Lymphs Abs: 2300 cells/uL (ref 850–3900)
MCH: 30.2 pg (ref 27.0–33.0)
MCHC: 33.6 g/dL (ref 32.0–36.0)
MCV: 89.9 fL (ref 80.0–100.0)
MONO ABS: 550 {cells}/uL (ref 200–950)
MPV: 11.9 fL (ref 7.5–12.5)
Monocytes Relative: 11 %
NEUTROS PCT: 35 %
Neutro Abs: 1750 cells/uL (ref 1500–7800)
Platelets: 155 10*3/uL (ref 140–400)
RBC: 4.83 MIL/uL (ref 4.20–5.80)
RDW: 13.7 % (ref 11.0–15.0)
WBC: 5 10*3/uL (ref 3.8–10.8)

## 2015-09-25 NOTE — Patient Instructions (Signed)

## 2015-09-25 NOTE — Progress Notes (Signed)
[facility]                       Unk Pinto, M.D.        Uvaldo Bristle. Silverio Lay, P.A.-C       Starlyn Skeans, P.A.-C   [facility]                [address] [place], [place] [id number] Telephone [phone] Telefax [phone] ______________________________________________________________________     This very nice 60 y.o.DBM presents for 6 month follow up with Hypertension, Hyperlipidemia, Pre-Diabetes and Vitamin D Deficiency.      Patient is treated for HTN circa 2004 & BP has been controlled at home. Today's BP: 120/82. Patient has had no complaints of any cardiac type chest pain, palpitations, dyspnea/orthopnea/PND, dizziness, claudication, or dependent edema.     Hyperlipidemia is controlled with diet & meds. Patient denies myalgias or other med SE's. Last Lipids were at goal: Lab Results  Component Value Date   CHOL 153 03/26/2015   HDL 40 03/26/2015   LDLCALC 90 03/26/2015   TRIG 114 03/26/2015   CHOLHDL 3.8 03/26/2015      Also, the patient has history of PreDiabetes since 2009 with A1c 6.4% and then 5.9% in 2016 and has had no symptoms of reactive hypoglycemia, diabetic polys, paresthesias or visual blurring.  Last A1c was almost at goal: Lab Results  Component Value Date   HGBA1C 5.7 (H) 03/26/2015      Further, the patient also has history of Vitamin D Deficiency of '21" IN 2008  and supplements Vitamin D with poor compliance. Last vitamin D was low and STILL  not at goal: Lab Results  Component Value Date   VD25OH 36 03/26/2015   Current Outpatient Prescriptions on File Prior to Visit  Medication Sig  . acyclovir (ZOVIRAX) 800 MG tablet Take 1 tablet (800 mg total) by mouth daily.  Marland Kitchen allopurinol (ZYLOPRIM) 300 MG tablet TAKE ONE TABLET BY MOUTH ONCE DAILY FOR GOUT.  Marland Kitchen alum & mag hydroxide-simeth (MAALOX/MYLANTA) 200-200-20 MG/5ML suspension Take 30 mLs by mouth 2 (two) times  daily.  Marland Kitchen aspirin EC 81 MG tablet Take 81 mg by mouth daily.  Marland Kitchen atenolol (TENORMIN) 100 MG tablet Take 1 tablet daily for BP  . B Complex-C (SUPER B COMPLEX PO) Take 1 tablet by mouth daily.  . Cholecalciferol (VITAMIN D) 2000 UNITS CAPS Take 2,000 Units by mouth daily.  . Flaxseed, Linseed, (FLAX SEED OIL) 1000 MG CAPS Take 1,000 mg by mouth daily.  Marland Kitchen glucose blood (FREESTYLE TEST STRIPS) test strip Use as instructed, DX 250.00  . hydrochlorothiazide (HYDRODIURIL) 25 MG tablet Take 1 tablet daily for BP & ankle swelling  . Lancets (FREESTYLE) lancets   . loratadine (CLARITIN) 10 MG tablet Take 1 tablet daily for allergies  . Omega-3 Fatty Acids (FISH OIL) 1000 MG CAPS Take 1,000 mg by mouth daily.  . pravastatin (PRAVACHOL) 40 MG tablet TAKE ONE TABLET BY MOUTH ONCE DAILY  . ranitidine (ZANTAC) 300 MG tablet Take 1 tablet (300 mg total) by mouth 2 (two) times daily.  . sildenafil (REVATIO) 20 MG tablet Take 1-5 tablets daily PRN XXXX.  Marland Kitchen sildenafil (VIAGRA) 100 MG tablet Take 1 tablet (100 mg total) by mouth as needed for erectile dysfunction.  . triamcinolone cream (KENALOG) 0.1 % Apply to rash 3 to 4  x daily if needed   No current facility-administered medications on file prior to visit.      Allergies  Allergen Reactions  . Ace Inhibitors     REACTION: cough  . Trilipix [Choline Fenofibrate] Other (See Comments)    Heart burn    PMHx:   Past Medical History:  Diagnosis Date  . GERD (gastroesophageal reflux disease)   . Gout   . HSV-1 (herpes simplex virus 1) infection   . HSV-2 (herpes simplex virus 2) infection   . Hyperlipidemia   . Hypertension   . Hypogonadism male   . Obese   . Pneumonia    walking -many years ago  . Pre-diabetes    Immunization History  Administered Date(s) Administered  . PPD Test 02/13/2014  . Pneumococcal Polysaccharide-23 02/23/2002  . Tdap 05/15/2009   Past Surgical History:  Procedure Laterality Date  . BACK SURGERY  2012   on  back-hernia  . HERNIA REPAIR    . INSERTION OF MESH N/A 08/15/2013   Procedure: INSERTION OF MESH;  Surgeon: Odis Hollingshead, MD;  Location: [facility];  Service: General;  Laterality: N/A;  . LAPAROSCOPIC INGUINAL HERNIA WITH UMBILICAL HERNIA Right [id number]   Procedure: LAPAROSCOPIC RIGH  INGUINAL HERNIA WITH OPEN UMBILICAL HERNIA;  Surgeon: Odis Hollingshead, MD;  Location: [facility];  Service: General;  Laterality: Right;  umbilical repair with mesh   FHx:    Reviewed / unchanged  SHx:    Reviewed / unchanged  Systems Review:  Constitutional: Denies fever, chills, wt changes, headaches, insomnia, fatigue, night sweats, change in appetite. Eyes: Denies redness, blurred vision, diplopia, discharge, itchy, watery eyes.  ENT: Denies discharge, congestion, post nasal drip, epistaxis, sore throat, earache, hearing loss, dental pain, tinnitus, vertigo, sinus pain, snoring.  CV: Denies chest pain, palpitations, irregular heartbeat, syncope, dyspnea, diaphoresis, orthopnea, PND, claudication or edema. Respiratory: denies cough, dyspnea, DOE, pleurisy, hoarseness, laryngitis, wheezing.  Gastrointestinal: Denies dysphagia, odynophagia, heartburn, reflux, water brash, abdominal pain or cramps, nausea, vomiting, bloating, diarrhea, constipation, hematemesis, melena, hematochezia  or hemorrhoids. Genitourinary: Denies dysuria, frequency, urgency, nocturia, hesitancy, discharge, hematuria or flank pain. Musculoskeletal: Denies arthralgias, myalgias, stiffness, jt. swelling, pain, limping or strain/sprain.  Skin: Denies pruritus, rash, hives, warts, acne, eczema or change in skin lesion(s). Neuro: No weakness, tremor, incoordination, spasms, paresthesia or pain. Psychiatric: Denies confusion, memory loss or sensory loss. Endo: Denies change in weight, skin or hair change.  Heme/Lymph: No excessive bleeding, bruising or enlarged lymph nodes.  Physical Exam  BP 120/82   Pulse 60   Temp 97.5 F (36.4 C)    Resp 16   Ht 5' 8.25" (1.734 m)   Wt 196 lb 9.6 oz (89.2 kg)   BMI 29.67 kg/m   Appears well nourished and in no distress. Eyes: PERRLA, EOMs, conjunctiva no swelling or erythema. Sinuses: No frontal/maxillary tenderness ENT/Mouth: EAC's clear, TM's nl w/o erythema, bulging. Nares clear w/o erythema, swelling, exudates. Oropharynx clear without erythema or exudates. Oral hygiene is good. Tongue normal, non obstructing. Hearing intact.  Neck: Supple. Thyroid nl. Car 2+/2+ without bruits, nodes or JVD. Chest: Respirations nl with BS clear & equal w/o rales, rhonchi, wheezing or stridor.  Cor: Heart sounds normal w/ regular rate and rhythm without sig. murmurs, gallops, clicks, or rubs. Peripheral pulses normal and equal  without edema.  Abdomen: Soft & bowel sounds normal. Non-tender w/o guarding, rebound, hernias, masses, or organomegaly.  Lymphatics: Unremarkable.  Musculoskeletal: Full ROM all peripheral extremities, joint stability, 5/5  strength, and normal gait.  Skin: Warm, dry without exposed rashes, lesions or ecchymosis apparent.  Neuro: Cranial nerves intact, reflexes equal bilaterally. Sensory-motor testing grossly intact. Tendon reflexes grossly intact.  Pysch: Alert & oriented x 3.  Insight and judgement nl & appropriate. No ideations.  Assessment and Plan:   1. Essential hypertension  - Continue medication, monitor blood pressure at home. Continue DASH diet. Reminder to go to the ER if any CP, SOB, nausea, dizziness, severe HA, changes vision/speech, left arm numbness and tingling and jaw pain. - TSH  2. Hyperlipidemia  - Continue diet/meds, exercise,& lifestyle modifications. Continue monitor periodic cholesterol/liver & renal functions  - Lipid panel - TSH  3. Prediabetes  - Continue diet, exercise, lifestyle modifications. Monitor appropriate labs. - Hemoglobin A1c - Insulin, random  4. Vitamin D deficiency - Continue supplementation. - VITAMIN D 25 Hydroxy    5. Gastroesophageal reflux disease  6. Testosterone deficiency  7. Gout  - Uric acid  8. Medication management  - CBC with Differential/Platelet - BASIC METABOLIC PANEL WITH GFR - Hepatic function panel - Magnesium   Recommended regular exercise, BP monitoring, weight control, and discussed med and SE's. Recommended labs to assess and monitor clinical status. Further disposition pending results of labs. Over 30 minutes of exam, counseling, chart review was performed

## 2015-09-26 LAB — HEPATIC FUNCTION PANEL
ALT: 17 U/L (ref 9–46)
AST: 28 U/L (ref 10–35)
Albumin: 4.1 g/dL (ref 3.6–5.1)
Alkaline Phosphatase: 66 U/L (ref 40–115)
Bilirubin, Direct: 0.1 mg/dL (ref <=0.2)
Indirect Bilirubin: 0.5 mg/dL (ref 0.2–1.2)
TOTAL PROTEIN: 6.4 g/dL (ref 6.1–8.1)
Total Bilirubin: 0.6 mg/dL (ref 0.2–1.2)

## 2015-09-26 LAB — BASIC METABOLIC PANEL WITH GFR
BUN: 10 mg/dL (ref 7–25)
CALCIUM: 9.7 mg/dL (ref 8.6–10.3)
CO2: 25 mmol/L (ref 20–31)
Chloride: 105 mmol/L (ref 98–110)
Creat: 0.93 mg/dL (ref 0.70–1.33)
GFR, Est Non African American: >89 mL/min (ref >=60)
GLUCOSE: 91 mg/dL (ref 65–99)
Potassium: 4 mmol/L (ref 3.5–5.3)
Sodium: 139 mmol/L (ref 135–146)

## 2015-09-26 LAB — MAGNESIUM: MAGNESIUM: 2 mg/dL (ref 1.5–2.5)

## 2015-09-26 LAB — LIPID PANEL
CHOL/HDL RATIO: 3.5 ratio (ref <=5.0)
CHOLESTEROL: 149 mg/dL (ref 125–200)
HDL: 43 mg/dL (ref >=40)
LDL Cholesterol: 80 mg/dL (ref <130)
TRIGLYCERIDES: 132 mg/dL (ref <150)
VLDL: 26 mg/dL (ref <30)

## 2015-09-26 LAB — INSULIN, RANDOM: INSULIN: 6 u[IU]/mL (ref 2.0–19.6)

## 2015-09-26 LAB — TSH: TSH: 1.34 mIU/L (ref 0.40–4.50)

## 2015-09-26 LAB — URIC ACID: URIC ACID, SERUM: 4.1 mg/dL (ref 4.0–8.0)

## 2015-09-26 LAB — HEMOGLOBIN A1C
Hgb A1c MFr Bld: 5.8 % — ABNORMAL HIGH (ref <5.7)
MEAN PLASMA GLUCOSE: 120 mg/dL

## 2015-09-26 LAB — VITAMIN D 25 HYDROXY (VIT D DEFICIENCY, FRACTURES): Vit D, 25-Hydroxy: 38 ng/mL (ref 30–100)

## 2015-10-08 ENCOUNTER — Encounter: Payer: Self-pay | Admitting: Internal Medicine

## 2015-10-08 ENCOUNTER — Ambulatory Visit (INDEPENDENT_AMBULATORY_CARE_PROVIDER_SITE_OTHER): Payer: BLUE CROSS/BLUE SHIELD | Admitting: Internal Medicine

## 2015-10-08 VITALS — BP 116/70 | HR 58 | Temp 97.8°F | Resp 16 | Ht 68.25 in | Wt 200.0 lb

## 2015-10-08 DIAGNOSIS — J309 Allergic rhinitis, unspecified: Secondary | ICD-10-CM

## 2015-10-08 MED ORDER — FLUTICASONE PROPIONATE 50 MCG/ACT NA SUSP: 2.0000 | Freq: Every day | NASAL | 0 refills | Status: DC

## 2015-10-08 MED ORDER — DEXAMETHASONE SODIUM PHOSPHATE 10 MG/ML IJ SOLN: 10.0000 mg | Freq: Once | INTRAMUSCULAR | Status: DC

## 2015-10-08 NOTE — Progress Notes (Signed)
   Subjective:    Patient ID: Caleb Johns, male    DOB: 04/16/1955, 60 y.o.   MRN: [id number]  HPI  Patient presents to the office for evaluation of chills, fatigue and myalgias.  He reports that he does have some hoarseness and also is having some muscle aches.  He does have some post nasal drainage.  No ear pain.  Some headache.  He reports that he did take some OTC meds which helped.  He reports that he just couldn't get warm the other day.  He reports that his girlfriend also had something going on.  He is not sure that there were any sick contacts.    Review of Systems  Constitutional: Positive for chills and fatigue. Negative for fever.  HENT: Positive for ear pain, postnasal drip and sore throat. Negative for rhinorrhea.   Respiratory: Positive for cough. Negative for chest tightness and shortness of breath.   Cardiovascular: Negative for chest pain and palpitations.  Gastrointestinal: Negative for constipation, diarrhea, nausea and vomiting.       Objective:   Physical Exam  Constitutional: He is oriented to person, place, and time. He appears well-developed and well-nourished. No distress.  HENT:  Head: Normocephalic.  Nose: Mucosal edema present. Right sinus exhibits no maxillary sinus tenderness and no frontal sinus tenderness. Left sinus exhibits no maxillary sinus tenderness and no frontal sinus tenderness.  Mouth/Throat: Oropharynx is clear and moist. No oropharyngeal exudate.  Eyes: Conjunctivae are normal. No scleral icterus.  Neck: Normal range of motion. Neck supple. No JVD present. No thyromegaly present.  Cardiovascular: Normal rate, regular rhythm, normal heart sounds and intact distal pulses.  Exam reveals no gallop and no friction rub.   No murmur heard. Pulmonary/Chest: Effort normal and breath sounds normal. No respiratory distress. He has no wheezes. He has no rales. He exhibits no tenderness.  Abdominal: Soft. Bowel sounds are normal. He exhibits no distension  and no mass. There is no tenderness. There is no rebound and no guarding.  Musculoskeletal: Normal range of motion.  Lymphadenopathy:    He has no cervical adenopathy.  Neurological: He is alert and oriented to person, place, and time. No cranial nerve deficit. Coordination normal.  Skin: Skin is warm and dry. No rash noted. He is not diaphoretic.  Psychiatric: He has a normal mood and affect. His behavior is normal. Judgment and thought content normal.  Nursing note and vitals reviewed.   Vitals:   10/08/15 1623  BP: 116/70  Pulse: (!) 58  Resp: 16  Temp: 97.8 F (36.6 C)         Assessment & Plan:    1. Allergic rhinitis, unspecified allergic rhinitis type -start zyrtec daily -norel AD samples given -nasal saline - dexamethasone (DECADRON) injection 10 mg; Inject 1 mL (10 mg total) into the muscle once. - fluticasone (FLONASE) 50 MCG/ACT nasal spray; Place 2 sprays into both nostrils daily.  Dispense: 16 g; Refill: 0

## 2015-11-25 ENCOUNTER — Other Ambulatory Visit: Payer: Self-pay | Admitting: Internal Medicine

## 2016-01-01 ENCOUNTER — Ambulatory Visit (INDEPENDENT_AMBULATORY_CARE_PROVIDER_SITE_OTHER): Payer: BLUE CROSS/BLUE SHIELD | Admitting: Physician Assistant

## 2016-01-01 ENCOUNTER — Encounter: Payer: Self-pay | Admitting: Physician Assistant

## 2016-01-01 VITALS — BP 118/78 | HR 65 | Temp 97.3°F | Resp 16 | Ht 68.25 in | Wt 202.6 lb

## 2016-01-01 DIAGNOSIS — E349 Endocrine disorder, unspecified: Secondary | ICD-10-CM

## 2016-01-01 DIAGNOSIS — E782 Mixed hyperlipidemia: Secondary | ICD-10-CM | POA: Diagnosis not present

## 2016-01-01 DIAGNOSIS — E559 Vitamin D deficiency, unspecified: Secondary | ICD-10-CM

## 2016-01-01 DIAGNOSIS — I10 Essential (primary) hypertension: Secondary | ICD-10-CM | POA: Diagnosis not present

## 2016-01-01 DIAGNOSIS — R7303 Prediabetes: Secondary | ICD-10-CM | POA: Diagnosis not present

## 2016-01-01 DIAGNOSIS — Z79899 Other long term (current) drug therapy: Secondary | ICD-10-CM | POA: Diagnosis not present

## 2016-01-01 LAB — CBC WITH DIFFERENTIAL/PLATELET
BASOS PCT: 1 %
Basophils Absolute: 58 cells/uL (ref 0–200)
EOS PCT: 7 %
Eosinophils Absolute: 406 cells/uL (ref 15–500)
HCT: 43.9 % (ref 38.5–50.0)
HEMOGLOBIN: 15 g/dL (ref 13.2–17.1)
LYMPHS ABS: 2900 {cells}/uL (ref 850–3900)
Lymphocytes Relative: 50 %
MCH: 30.9 pg (ref 27.0–33.0)
MCHC: 34.2 g/dL (ref 32.0–36.0)
MCV: 90.5 fL (ref 80.0–100.0)
MPV: 11.2 fL (ref 7.5–12.5)
Monocytes Absolute: 580 cells/uL (ref 200–950)
Monocytes Relative: 10 %
NEUTROS ABS: 1856 {cells}/uL (ref 1500–7800)
NEUTROS PCT: 32 %
Platelets: 156 10*3/uL (ref 140–400)
RBC: 4.85 MIL/uL (ref 4.20–5.80)
RDW: 14.4 % (ref 11.0–15.0)
WBC: 5.8 10*3/uL (ref 3.8–10.8)

## 2016-01-01 LAB — BASIC METABOLIC PANEL WITH GFR
BUN: 15 mg/dL (ref 7–25)
CALCIUM: 9.7 mg/dL (ref 8.6–10.3)
CO2: 27 mmol/L (ref 20–31)
Chloride: 102 mmol/L (ref 98–110)
Creat: 0.9 mg/dL (ref 0.70–1.33)
GFR, Est African American: >89 mL/min (ref >=60)
Glucose, Bld: 93 mg/dL (ref 65–99)
Potassium: 3.7 mmol/L (ref 3.5–5.3)
SODIUM: 139 mmol/L (ref 135–146)

## 2016-01-01 LAB — LIPID PANEL
CHOL/HDL RATIO: 4.4 ratio (ref <5.0)
CHOLESTEROL: 164 mg/dL (ref <200)
HDL: 37 mg/dL — AB (ref >40)
LDL Cholesterol: 90 mg/dL
Triglycerides: 183 mg/dL — ABNORMAL HIGH (ref <150)
VLDL: 37 mg/dL — ABNORMAL HIGH (ref <30)

## 2016-01-01 LAB — TSH: TSH: 1.4 m[IU]/L (ref 0.40–4.50)

## 2016-01-01 LAB — HEPATIC FUNCTION PANEL
ALT: 13 U/L (ref 9–46)
AST: 25 U/L (ref 10–35)
Albumin: 4.4 g/dL (ref 3.6–5.1)
Alkaline Phosphatase: 68 U/L (ref 40–115)
BILIRUBIN DIRECT: 0.1 mg/dL (ref <=0.2)
BILIRUBIN INDIRECT: 0.6 mg/dL (ref 0.2–1.2)
Total Bilirubin: 0.7 mg/dL (ref 0.2–1.2)
Total Protein: 6.4 g/dL (ref 6.1–8.1)

## 2016-01-01 LAB — MAGNESIUM: Magnesium: 1.8 mg/dL (ref 1.5–2.5)

## 2016-01-01 NOTE — Patient Instructions (Signed)

## 2016-01-01 NOTE — Progress Notes (Signed)
Assessment and Plan:   Hypertension -Continue medication but can cut atenolol in half due to weight loss, dizziness and bradycardia, monitor blood pressure at home. Continue DASH diet.  Reminder to go to the ER if any CP, SOB, nausea, dizziness, severe HA, changes vision/speech, left arm numbness and tingling and jaw pain.  Cholesterol -Continue diet and exercise. Check cholesterol.    Prediabetes  -Continue diet and exercise. Check A1C  Vitamin D Def - check level and continue medications.   Obesity with co morbidities - long discussion about weight loss, diet, and exercise. Stop sweet tea and gatorade.    Continue diet and meds as discussed. Further disposition pending results of labs. Over 30 minutes of exam, counseling, chart review, and critical decision making was performed Future Appointments Date Time Provider [place]  03/25/2016 3:00 PM Courtney Forcucci, PA-C [id number] None    HPI 60 y.o. AA male  presents for 3 month follow up on hypertension, cholesterol, prediabetes, and vitamin D deficiency.   His blood pressure has been controlled at home, today their BP is BP: 118/78  He does workout, he has been walking with the nice weaither. He denies chest pain, shortness of breath. He has had some dizziness with standing and heart rate has been in 50's.  He complains of bilateral hand pain, worse when it is cold, will get better when it is warmed up, denies change in color, has tingling/numbness, no weakness.   He is on cholesterol medication, pravastatin 40 1/2 pill M,W,F and denies myalgias. His cholesterol is at goal. The cholesterol last visit was:   Lab Results  Component Value Date   CHOL 149 09/25/2015   HDL 43 09/25/2015   LDLCALC 80 09/25/2015   TRIG 132 09/25/2015   CHOLHDL 3.5 09/25/2015   He has been working on diet and exercise for prediabetes, and denies paresthesia of the feet, polydipsia, polyuria and visual disturbances. Last A1C in the office was:   Lab Results  Component Value Date   HGBA1C 5.8 (H) 09/25/2015  Patient is on Vitamin D supplement.   Lab Results  Component Value Date   VD25OH 38 09/25/2015  BMI is Body mass index is 30.58 kg/m., he is working on diet and exercise and has done a good job with weight loss. Wt Readings from Last 3 Encounters:  01/01/16 202 lb 9.6 oz (91.9 kg)  10/08/15 200 lb (90.7 kg)  09/25/15 196 lb 9.6 oz (89.2 kg)   Patient is on allopurinol for gout and does report a recent flare.  Lab Results  Component Value Date   LABURIC 4.1 09/25/2015    Current Medications:  Current Outpatient Prescriptions on File Prior to Visit  Medication Sig Dispense Refill  . acyclovir (ZOVIRAX) 800 MG tablet Take 1 tablet (800 mg total) by mouth daily. 90 tablet 1  . allopurinol (ZYLOPRIM) 300 MG tablet TAKE ONE TABLET BY MOUTH ONCE DAILY FOR GOUT. 90 tablet 1  . alum & mag hydroxide-simeth (MAALOX/MYLANTA) 200-200-20 MG/5ML suspension Take 30 mLs by mouth 2 (two) times daily.    Marland Kitchen aspirin EC 81 MG tablet Take 81 mg by mouth daily.    Marland Kitchen atenolol (TENORMIN) 100 MG tablet Take 1 tablet daily for BP 90 tablet 1  . B Complex-C (SUPER B COMPLEX PO) Take 1 tablet by mouth daily.    . Cholecalciferol (VITAMIN D) 2000 UNITS CAPS Take 2,000 Units by mouth daily.    . Flaxseed, Linseed, (FLAX SEED OIL) 1000 MG CAPS Take  1,000 mg by mouth daily.    . fluticasone (FLONASE) 50 MCG/ACT nasal spray Place 2 sprays into both nostrils daily. 16 g 0  . glucose blood (FREESTYLE TEST STRIPS) test strip Use as instructed, DX 250.00 100 each 12  . hydrochlorothiazide (HYDRODIURIL) 25 MG tablet Take 1 tablet daily for BP & ankle swelling 90 tablet 1  . Lancets (FREESTYLE) lancets   12  . loratadine (CLARITIN) 10 MG tablet Take 1 tablet daily for allergies 30 tablet 99  . Omega-3 Fatty Acids (FISH OIL) 1000 MG CAPS Take 1,000 mg by mouth daily.    . pravastatin (PRAVACHOL) 40 MG tablet TAKE ONE TABLET BY MOUTH ONCE DAILY 90 tablet 1   . ranitidine (ZANTAC) 300 MG tablet Take 1 tablet (300 mg total) by mouth 2 (two) times daily. 180 tablet 1  . sildenafil (REVATIO) 20 MG tablet Take 1-5 tablets daily PRN XXXX. 90 tablet 99  . sildenafil (VIAGRA) 100 MG tablet Take 1 tablet (100 mg total) by mouth as needed for erectile dysfunction. 200 tablet 0  . triamcinolone cream (KENALOG) 0.1 % Apply to rash 3 to 4 x daily if needed 80 g 3   Current Facility-Administered Medications on File Prior to Visit  Medication Dose Route Frequency Provider Last Rate Last Dose  . dexamethasone (DECADRON) injection 10 mg  10 mg Intramuscular Once Starlyn Skeans, PA-C       Medical History:  Past Medical History:  Diagnosis Date  . GERD (gastroesophageal reflux disease)   . Gout   . HSV-1 (herpes simplex virus 1) infection   . HSV-2 (herpes simplex virus 2) infection   . Hyperlipidemia   . Hypertension   . Hypogonadism male   . Obese   . Pneumonia    walking -many years ago  . Pre-diabetes    Allergies:  Allergies  Allergen Reactions  . Ace Inhibitors     REACTION: cough  . Trilipix [Choline Fenofibrate] Other (See Comments)    Heart burn     Review of Systems:  Review of Systems  Constitutional: Negative.   HENT: Negative.   Eyes: Negative.   Respiratory: Negative.   Cardiovascular: Negative.   Gastrointestinal: Negative.   Genitourinary: Negative.   Musculoskeletal: Negative.   Skin: Negative for itching and rash.  Neurological: Positive for sensory change (bilateral hands). Negative for dizziness, tingling, tremors, speech change, focal weakness, seizures and loss of consciousness.  Endo/Heme/Allergies: Negative.   Psychiatric/Behavioral: Negative.     Family history- Review and unchanged Social history- Review and unchanged Physical Exam: BP 118/78   Pulse 65   Temp 97.3 F (36.3 C)   Resp 16   Ht 5' 8.25" (1.734 m)   Wt 202 lb 9.6 oz (91.9 kg)   SpO2 96%   BMI 30.58 kg/m  Wt Readings from Last 3  Encounters:  01/01/16 202 lb 9.6 oz (91.9 kg)  10/08/15 200 lb (90.7 kg)  09/25/15 196 lb 9.6 oz (89.2 kg)   General Appearance: Well nourished, in no apparent distress. Eyes: PERRLA, EOMs, conjunctiva no swelling or erythema Sinuses: No Frontal/maxillary tenderness ENT/Mouth: Ext aud canals clear, TMs without erythema, bulging. No erythema, swelling, or exudate on post pharynx.  Tonsils not swollen or erythematous. Hearing normal.  Neck: Supple, thyroid normal.  Respiratory: Respiratory effort normal, BS equal bilaterally without rales, rhonchi, wheezing or stridor.  Cardio: RR, bradycardia with no MRGs. Brisk peripheral pulses without edema.  Abdomen: Soft, + BS,  Non tender, no guarding, rebound, hernias,  masses. Lymphatics: Non tender without lymphadenopathy.  Musculoskeletal: Full ROM, 5/5 strength, Normal gait, neg phalen/neg tinel's, good grip, no atrophy, normal neurovascular exam bilateral hands/arms.  Skin: Warm, dry without rashes, lesions, ecchymosis.  Neuro: Cranial nerves intact. Normal muscle tone, no cerebellar symptoms. Psych: Awake and oriented X 3, normal affect, Insight and Judgment appropriate.    Vicie Mutters, PA-C 4:43 PM [facility]

## 2016-01-02 LAB — HEMOGLOBIN A1C
HEMOGLOBIN A1C: 5.4 % (ref <5.7)
Mean Plasma Glucose: 108 mg/dL

## 2016-01-02 LAB — VITAMIN D 25 HYDROXY (VIT D DEFICIENCY, FRACTURES): VIT D 25 HYDROXY: 37 ng/mL (ref 30–100)

## 2016-03-07 DIAGNOSIS — E119 Type 2 diabetes mellitus without complications: Secondary | ICD-10-CM | POA: Diagnosis not present

## 2016-03-09 ENCOUNTER — Encounter: Payer: Self-pay | Admitting: Internal Medicine

## 2016-03-25 ENCOUNTER — Encounter: Payer: Self-pay | Admitting: Internal Medicine

## 2016-03-26 ENCOUNTER — Encounter: Payer: Self-pay | Admitting: Internal Medicine

## 2016-03-26 ENCOUNTER — Ambulatory Visit (INDEPENDENT_AMBULATORY_CARE_PROVIDER_SITE_OTHER): Payer: BLUE CROSS/BLUE SHIELD | Admitting: Internal Medicine

## 2016-03-26 VITALS — BP 120/76 | HR 52 | Temp 98.2°F | Resp 18 | Ht 68.0 in | Wt 212.0 lb

## 2016-03-26 DIAGNOSIS — E559 Vitamin D deficiency, unspecified: Secondary | ICD-10-CM | POA: Diagnosis not present

## 2016-03-26 DIAGNOSIS — E349 Endocrine disorder, unspecified: Secondary | ICD-10-CM

## 2016-03-26 DIAGNOSIS — Z79899 Other long term (current) drug therapy: Secondary | ICD-10-CM

## 2016-03-26 DIAGNOSIS — M1000 Idiopathic gout, unspecified site: Secondary | ICD-10-CM

## 2016-03-26 DIAGNOSIS — Z125 Encounter for screening for malignant neoplasm of prostate: Secondary | ICD-10-CM

## 2016-03-26 DIAGNOSIS — Z136 Encounter for screening for cardiovascular disorders: Secondary | ICD-10-CM

## 2016-03-26 DIAGNOSIS — Z0000 Encounter for general adult medical examination without abnormal findings: Secondary | ICD-10-CM | POA: Diagnosis not present

## 2016-03-26 DIAGNOSIS — E782 Mixed hyperlipidemia: Secondary | ICD-10-CM

## 2016-03-26 DIAGNOSIS — Z130 Encounter for screening for diseases of the blood and blood-forming organs and certain disorders involving the immune mechanism: Secondary | ICD-10-CM

## 2016-03-26 DIAGNOSIS — Z6832 Body mass index (BMI) 32.0-32.9, adult: Secondary | ICD-10-CM

## 2016-03-26 DIAGNOSIS — K219 Gastro-esophageal reflux disease without esophagitis: Secondary | ICD-10-CM

## 2016-03-26 DIAGNOSIS — I10 Essential (primary) hypertension: Secondary | ICD-10-CM

## 2016-03-26 DIAGNOSIS — I440 Atrioventricular block, first degree: Secondary | ICD-10-CM | POA: Diagnosis not present

## 2016-03-26 DIAGNOSIS — E6609 Other obesity due to excess calories: Secondary | ICD-10-CM

## 2016-03-26 DIAGNOSIS — R7303 Prediabetes: Secondary | ICD-10-CM

## 2016-03-26 DIAGNOSIS — Z0001 Encounter for general adult medical examination with abnormal findings: Secondary | ICD-10-CM

## 2016-03-26 LAB — BASIC METABOLIC PANEL WITH GFR
BUN: 11 mg/dL (ref 7–25)
CALCIUM: 9.1 mg/dL (ref 8.6–10.3)
CO2: 25 mmol/L (ref 20–31)
CREATININE: 1.03 mg/dL (ref 0.70–1.25)
Chloride: 105 mmol/L (ref 98–110)
GFR, Est Non African American: 79 mL/min (ref >=60)
GLUCOSE: 91 mg/dL (ref 65–99)
POTASSIUM: 4 mmol/L (ref 3.5–5.3)
Sodium: 140 mmol/L (ref 135–146)

## 2016-03-26 LAB — URINALYSIS, MICROSCOPIC ONLY
Bacteria, UA: NONE SEEN [HPF]
CASTS: NONE SEEN [LPF]
CRYSTALS: NONE SEEN [HPF]
RBC / HPF: NONE SEEN RBC/HPF (ref <=2)
Squamous Epithelial / LPF: NONE SEEN [HPF] (ref <=5)
WBC UA: NONE SEEN WBC/HPF (ref <=5)
YEAST: NONE SEEN [HPF]

## 2016-03-26 LAB — HEPATIC FUNCTION PANEL
ALBUMIN: 4.2 g/dL (ref 3.6–5.1)
ALT: 12 U/L (ref 9–46)
AST: 23 U/L (ref 10–35)
Alkaline Phosphatase: 60 U/L (ref 40–115)
Bilirubin, Direct: 0.1 mg/dL (ref <=0.2)
Indirect Bilirubin: 0.6 mg/dL (ref 0.2–1.2)
TOTAL PROTEIN: 6.4 g/dL (ref 6.1–8.1)
Total Bilirubin: 0.7 mg/dL (ref 0.2–1.2)

## 2016-03-26 LAB — CBC WITH DIFFERENTIAL/PLATELET
Basophils Absolute: 52 cells/uL (ref 0–200)
Basophils Relative: 1 %
EOS ABS: 312 {cells}/uL (ref 15–500)
Eosinophils Relative: 6 %
HEMATOCRIT: 42.9 % (ref 38.5–50.0)
Hemoglobin: 14.5 g/dL (ref 13.2–17.1)
LYMPHS PCT: 49 %
Lymphs Abs: 2548 cells/uL (ref 850–3900)
MCH: 30.7 pg (ref 27.0–33.0)
MCHC: 33.8 g/dL (ref 32.0–36.0)
MCV: 90.9 fL (ref 80.0–100.0)
MONO ABS: 520 {cells}/uL (ref 200–950)
MONOS PCT: 10 %
MPV: 11.5 fL (ref 7.5–12.5)
NEUTROS PCT: 34 %
Neutro Abs: 1768 cells/uL (ref 1500–7800)
PLATELETS: 141 10*3/uL (ref 140–400)
RBC: 4.72 MIL/uL (ref 4.20–5.80)
RDW: 13.9 % (ref 11.0–15.0)
WBC: 5.2 10*3/uL (ref 3.8–10.8)

## 2016-03-26 LAB — LIPID PANEL
Cholesterol: 144 mg/dL (ref <200)
HDL: 37 mg/dL — ABNORMAL LOW (ref >40)
LDL Cholesterol: 85 mg/dL (ref <100)
TRIGLYCERIDES: 109 mg/dL (ref <150)
Total CHOL/HDL Ratio: 3.9 Ratio (ref <5.0)
VLDL: 22 mg/dL (ref <30)

## 2016-03-26 LAB — URINALYSIS, ROUTINE W REFLEX MICROSCOPIC
Bilirubin Urine: NEGATIVE
Glucose, UA: NEGATIVE
HGB URINE DIPSTICK: NEGATIVE
Ketones, ur: NEGATIVE
NITRITE: NEGATIVE
Protein, ur: NEGATIVE
Specific Gravity, Urine: 1.008 (ref 1.001–1.035)
pH: 7 (ref 5.0–8.0)

## 2016-03-26 LAB — IRON AND TIBC
%SAT: 28 % (ref 15–60)
IRON: 81 ug/dL (ref 50–180)
TIBC: 288 ug/dL (ref 250–425)
UIBC: 207 ug/dL (ref 125–400)

## 2016-03-26 LAB — VITAMIN B12: VITAMIN B 12: 724 pg/mL (ref 200–1100)

## 2016-03-26 LAB — PSA: PSA: 1.6 ng/mL (ref <=4.0)

## 2016-03-26 LAB — HEMOGLOBIN A1C
Hgb A1c MFr Bld: 5.5 % (ref <5.7)
Mean Plasma Glucose: 111 mg/dL

## 2016-03-26 LAB — TSH: TSH: 1.1 mIU/L (ref 0.40–4.50)

## 2016-03-26 NOTE — Progress Notes (Signed)
Complete Physical  Assessment and Plan:   1. Encounter for general adult medical examination with abnormal findings   2. Essential hypertension -cont meds -dash diet -monitor at home - Urinalysis, Routine w reflex microscopic - Microalbumin / creatinine urine ratio - EKG 12-Lead - TSH  3. Hyperlipidemia -cont medication -cont diet and exercise - Lipid panel  4. Prediabetes -cont diet and exercise - Hemoglobin A1c - Insulin, random  5. Vitamin D deficiency -cont Vit D - VITAMIN D 25 Hydroxy (Vit-D Deficiency, Fractures)  6. Testosterone deficiency  - Testosterone  7. Medication management  - CBC with Differential/Platelet - BASIC METABOLIC PANEL WITH GFR - Hepatic function panel - Magnesium  8. Class 1 obesity due to excess calories with serious comorbidity and body mass index (BMI) of 32.0 to 32.9 in adult -cont diet and exercise  9. Acute idiopathic gout, unspecified site -cont allopurinol  10. Gastroesophageal reflux disease, esophagitis presence not specified -cont medications OTC  11. Screening for prostate cancer - PSA  12. Screening for deficiency anemia  - Iron and TIBC - Vitamin B12  13.  1st Degree AV block -normal cardiac workup 2 years ago -cont to monitor -barely prolonged PR interval  Discussed med's effects and SE's. Screening labs and tests as requested with regular follow-up as recommended.  HPI Patient presents for a complete physical.   His blood pressure has been controlled at home, today their BP is BP: 120/76 He does workout. He denies chest pain, shortness of breath, dizziness.   He is on cholesterol medication and denies myalgias. His cholesterol is at goal. The cholesterol last visit was:   Lab Results  Component Value Date   CHOL 164 01/01/2016   HDL 37 (L) 01/01/2016   LDLCALC 90 01/01/2016   TRIG 183 (H) 01/01/2016   CHOLHDL 4.4 01/01/2016    He has been working on diet and exercise for prediabetes, he is on  bASA, he is on ACE/ARB and denies foot ulcerations, hyperglycemia, hypoglycemia , increased appetite, nausea, paresthesia of the feet, polydipsia, polyuria, visual disturbances, vomiting and weight loss. Last A1C in the office was:  Lab Results  Component Value Date   HGBA1C 5.4 01/01/2016    Patient is on Vitamin D supplement.   Lab Results  Component Value Date   VD25OH 37 01/01/2016      Last PSA was: Lab Results  Component Value Date   PSA 1.80 02/13/2014  .  Denies BPH symptoms daytime frequency, double voiding, dysuria, hematuria, hesitancy, incontinence, intermittency, nocturia, sensation of incomplete bladder emptying, suprapubic pain, urgency, weak urinary stream or sometimes has a week stream.  He has not ecently had any issues with his GERD as long as he takes his medication.  He is also careful with his diet.    He has not had any gout outbreaks in the last 6 months.  He takes his allopurinol daily.    Current Medications:  Current Outpatient Prescriptions on File Prior to Visit  Medication Sig Dispense Refill  . acyclovir (ZOVIRAX) 800 MG tablet Take 1 tablet (800 mg total) by mouth daily. 90 tablet 1  . allopurinol (ZYLOPRIM) 300 MG tablet TAKE ONE TABLET BY MOUTH ONCE DAILY FOR GOUT. 90 tablet 1  . alum & mag hydroxide-simeth (MAALOX/MYLANTA) 200-200-20 MG/5ML suspension Take 30 mLs by mouth 2 (two) times daily.    Marland Kitchen aspirin EC 81 MG tablet Take 81 mg by mouth daily.    Marland Kitchen atenolol (TENORMIN) 100 MG tablet Take 1 tablet daily  for BP 90 tablet 1  . B Complex-C (SUPER B COMPLEX PO) Take 1 tablet by mouth daily.    . Cholecalciferol (VITAMIN D) 2000 UNITS CAPS Take 2,000 Units by mouth daily.    . Flaxseed, Linseed, (FLAX SEED OIL) 1000 MG CAPS Take 1,000 mg by mouth daily.    . fluticasone (FLONASE) 50 MCG/ACT nasal spray Place 2 sprays into both nostrils daily. 16 g 0  . glucose blood (FREESTYLE TEST STRIPS) test strip Use as instructed, DX 250.00 100 each 12  .  hydrochlorothiazide (HYDRODIURIL) 25 MG tablet Take 1 tablet daily for BP & ankle swelling 90 tablet 1  . Lancets (FREESTYLE) lancets   12  . loratadine (CLARITIN) 10 MG tablet Take 1 tablet daily for allergies 30 tablet 99  . Omega-3 Fatty Acids (FISH OIL) 1000 MG CAPS Take 1,000 mg by mouth daily.    . pravastatin (PRAVACHOL) 40 MG tablet TAKE ONE TABLET BY MOUTH ONCE DAILY 90 tablet 1  . ranitidine (ZANTAC) 300 MG tablet Take 1 tablet (300 mg total) by mouth 2 (two) times daily. 180 tablet 1  . sildenafil (REVATIO) 20 MG tablet Take 1-5 tablets daily PRN XXXX. 90 tablet 99  . sildenafil (VIAGRA) 100 MG tablet Take 1 tablet (100 mg total) by mouth as needed for erectile dysfunction. 200 tablet 0  . triamcinolone cream (KENALOG) 0.1 % Apply to rash 3 to 4 x daily if needed 80 g 3   No current facility-administered medications on file prior to visit.     Health Maintenance:  Immunization History  Administered Date(s) Administered  . PPD Test 02/13/2014  . Pneumococcal Polysaccharide-23 02/23/2002  . Tdap 05/15/2009    Tetanus: 2011 Pneumovax: 2004 Influenza:  Done at work 2017 Colonoscopy: 2008, Due this year   Patient Care Team: Unk Pinto, MD as PCP - General (Internal Medicine)  Allergies:  Allergies  Allergen Reactions  . Ace Inhibitors     REACTION: cough  . Trilipix [Choline Fenofibrate] Other (See Comments)    Heart burn    Medical History:  Past Medical History:  Diagnosis Date  . GERD (gastroesophageal reflux disease)   . Gout   . HSV-1 (herpes simplex virus 1) infection   . HSV-2 (herpes simplex virus 2) infection   . Hyperlipidemia   . Hypertension   . Hypogonadism male   . Obese   . Pneumonia    walking -many years ago  . Pre-diabetes     Surgical History:  Past Surgical History:  Procedure Laterality Date  . BACK SURGERY  2012   on back-hernia  . HERNIA REPAIR    . INSERTION OF MESH N/A 08/15/2013   Procedure: INSERTION OF MESH;  Surgeon:  Odis Hollingshead, MD;  Location: [facility];  Service: General;  Laterality: N/A;  . LAPAROSCOPIC INGUINAL HERNIA WITH UMBILICAL HERNIA Right [id number]   Procedure: LAPAROSCOPIC RIGH  INGUINAL HERNIA WITH OPEN UMBILICAL HERNIA;  Surgeon: Odis Hollingshead, MD;  Location: [facility];  Service: General;  Laterality: Right;  umbilical repair with mesh    Family History:  Family History  Problem Relation Age of Onset  . Diabetes Mother   . Hypertension Mother   . Hypertension Brother   . Hypertension Brother   . Hypertension Brother     Social History:   Social History  Substance Use Topics  . Smoking status: Former Smoker    Quit date: 02/23/2002  . Smokeless tobacco: Never Used  . Alcohol use No  Review of Systems:  Review of Systems  Constitutional: Negative for chills, fever and malaise/fatigue.  HENT: Negative for congestion, ear pain and sore throat.   Eyes: Negative.   Respiratory: Negative for cough, shortness of breath and wheezing.   Cardiovascular: Negative for chest pain, palpitations and leg swelling.  Gastrointestinal: Negative for abdominal pain, blood in stool, constipation, diarrhea, heartburn and melena.  Genitourinary: Negative.   Skin: Negative.   Neurological: Negative for dizziness, sensory change, loss of consciousness and headaches.  Psychiatric/Behavioral: Negative for depression. The patient is not nervous/anxious and does not have insomnia.     Physical Exam: Estimated body mass index is 32.23 kg/m as calculated from the following:   Height as of this encounter: 5\' 8"  (1.727 m).   Weight as of this encounter: 212 lb (96.2 kg). BP 120/76   Temp 98.2 F (36.8 C) (Temporal)   Resp 18   Ht 5\' 8"  (1.727 m)   Wt 212 lb (96.2 kg)   BMI 32.23 kg/m   General Appearance: Well nourished, in no apparent distress.  Eyes: PERRLA, EOMs, conjunctiva no swelling or erythema ENT/Mouth: Ear canals clear bilaterally with no erythema, swelling, discharge.  TMs normal  bilaterally with no erythema, bulging, or retractions.  Oropharynx clear and moist with no exudate, swelling, or erythema.  Dentition normal.   Neck: Supple, thyroid normal. No bruits, JVD, cervical adenopathy Respiratory: Respiratory effort normal, BS equal bilaterally without rales, rhonchi, wheezing or stridor.  Cardio: RRR without murmurs, rubs or gallops. Brisk peripheral pulses without edema.  Chest: symmetric, with normal excursions Abdomen: Soft, nontender, no guarding, rebound, hernias, masses, or organomegaly. Musculoskeletal: Full ROM all peripheral extremities,5/5 strength, and normal gait.  Skin: Warm, dry without rashes, lesions, ecchymosis. Neuro: A&Ox3, Cranial nerves intact, reflexes equal bilaterally. Normal muscle tone, no cerebellar symptoms. Sensation intact.  Psych: Normal affect, Insight and Judgment appropriate.   EKG: Borderline 1st degree AV block with sinus brady, Normal stress test 2 years ago.   Over 40 minutes of exam, counseling, chart review and critical decision making was performed  Loma Sousa Forcucci 2:05 PM [facility]

## 2016-03-27 LAB — MICROALBUMIN / CREATININE URINE RATIO
Creatinine, Urine: 59 mg/dL (ref 20–370)
MICROALB UR: 0.3 mg/dL
MICROALB/CREAT RATIO: 5 ug/mg{creat} (ref <30)

## 2016-03-27 LAB — INSULIN, RANDOM: Insulin: 5.4 u[IU]/mL (ref 2.0–19.6)

## 2016-03-27 LAB — VITAMIN D 25 HYDROXY (VIT D DEFICIENCY, FRACTURES): Vit D, 25-Hydroxy: 54 ng/mL (ref 30–100)

## 2016-03-27 LAB — TESTOSTERONE: Testosterone: 474 ng/dL (ref 250–827)

## 2016-03-27 LAB — MAGNESIUM: MAGNESIUM: 2.2 mg/dL (ref 1.5–2.5)

## 2016-04-24 ENCOUNTER — Other Ambulatory Visit: Payer: Self-pay | Admitting: Internal Medicine

## 2016-04-24 DIAGNOSIS — J309 Allergic rhinitis, unspecified: Secondary | ICD-10-CM

## 2016-05-18 ENCOUNTER — Other Ambulatory Visit: Payer: Self-pay | Admitting: *Deleted

## 2016-05-18 DIAGNOSIS — J302 Other seasonal allergic rhinitis: Secondary | ICD-10-CM

## 2016-05-18 MED ORDER — LORATADINE 10 MG PO TABS: ORAL_TABLET | ORAL | 6 refills | Status: DC

## 2016-06-24 ENCOUNTER — Other Ambulatory Visit: Payer: Self-pay | Admitting: Internal Medicine

## 2016-06-24 DIAGNOSIS — I10 Essential (primary) hypertension: Secondary | ICD-10-CM

## 2016-06-25 ENCOUNTER — Telehealth: Payer: Self-pay | Admitting: *Deleted

## 2016-06-25 ENCOUNTER — Other Ambulatory Visit: Payer: Self-pay | Admitting: *Deleted

## 2016-06-25 MED ORDER — SILDENAFIL CITRATE 100 MG PO TABS: 100.0000 mg | ORAL_TABLET | ORAL | 0 refills | Status: DC | PRN

## 2016-06-25 NOTE — Telephone Encounter (Signed)
Refill for the patient's Viagra sent to [facility] at fax [phone], at the patient's request.  Rx was approved by Dr Melford Aase.

## 2016-07-08 ENCOUNTER — Encounter: Payer: Self-pay | Admitting: Gastroenterology

## 2016-07-08 ENCOUNTER — Telehealth: Payer: Self-pay | Admitting: Physician Assistant

## 2016-07-08 NOTE — Telephone Encounter (Signed)
-----   Message from Caleb Johns sent at 07/08/2016  3:27 PM EDT ----- Regarding: colonoscopy  Patient called requesting if we would send a referral to [facility] for his 10 year Colonoscopy screening.  Thanks [place]

## 2016-08-06 ENCOUNTER — Ambulatory Visit: Payer: BLUE CROSS/BLUE SHIELD | Admitting: Internal Medicine

## 2016-08-06 VITALS — BP 140/90 | HR 53 | Temp 97.5°F | Resp 16 | Ht 68.0 in | Wt 194.0 lb

## 2016-08-06 DIAGNOSIS — Z708 Other sex counseling: Secondary | ICD-10-CM

## 2016-08-09 ENCOUNTER — Encounter: Payer: Self-pay | Admitting: Internal Medicine

## 2016-08-09 NOTE — Progress Notes (Signed)
     Patient is a nice 61 yo DBM presenting with concern as to whether he should have STD testing after recent encounter with a former girlfriend and unprotected sex. He denies any suspect sx's.     Advised patient that testing not warranted w/o personal sx's or hx/o sx's in his friend.  Imp- No Active Disease  Plan - advised return if develop sx's.

## 2016-08-09 NOTE — Patient Instructions (Signed)
Sexually Transmitted Disease  A sexually transmitted disease (STD) is a disease or infection that may be passed (transmitted) from person to person, usually during sexual activity. This may happen by way of saliva, semen, blood, vaginal mucus, or urine. Common STDs include:   Gonorrhea.   Chlamydia.   Syphilis.   HIV and AIDS.   Genital herpes.   Hepatitis B and C.   Trichomonas.   Human papillomavirus (HPV).   Pubic lice.   Scabies.   Mites.   Bacterial vaginosis.    What are the causes?  An STD may be caused by bacteria, a virus, or parasites. STDs are often transmitted during sexual activity if one person is infected. However, they may also be transmitted through nonsexual means. STDs may be transmitted after:   Sexual intercourse with an infected person.   Sharing sex toys with an infected person.   Sharing needles with an infected person or using unclean piercing or tattoo needles.   Having intimate contact with the genitals, mouth, or rectal areas of an infected person.   Exposure to infected fluids during birth.    What are the signs or symptoms?  Different STDs have different symptoms. Some people may not have any symptoms. If symptoms are present, they may include:   Painful or bloody urination.   Pain in the pelvis, abdomen, vagina, anus, throat, or eyes.   A skin rash, itching, or irritation.   Growths, ulcerations, blisters, or sores in the genital and anal areas.   Abnormal vaginal discharge with or without bad odor.   Penile discharge in men.   Fever.   Pain or bleeding during sexual intercourse.   Swollen glands in the groin area.   Yellow skin and eyes (jaundice). This is seen with hepatitis.   Swollen testicles.   Infertility.   Sores and blisters in the mouth.    How is this diagnosed?  To make a diagnosis, your health care provider may:   Take a medical history.   Perform a physical exam.   Take a sample of any discharge to examine.   Swab the throat, cervix,  opening to the penis, rectum, or vagina for testing.   Test a sample of your first morning urine.   Perform blood tests.   Perform a Pap test, if this applies.   Perform a colposcopy.   Perform a laparoscopy.    How is this treated?  Treatment depends on the STD. Some STDs may be treated but not cured.   Chlamydia, gonorrhea, trichomonas, and syphilis can be cured with antibiotic medicine.   Genital herpes, hepatitis, and HIV can be treated, but not cured, with prescribed medicines. The medicines lessen symptoms.   Genital warts from HPV can be treated with medicine or by freezing, burning (electrocautery), or surgery. Warts may come back.   HPV cannot be cured with medicine or surgery. However, abnormal areas may be removed from the cervix, vagina, or vulva.   If your diagnosis is confirmed, your recent sexual partners need treatment. This is true even if they are symptom-free or have a negative culture or evaluation. They should not have sex until their health care providers say it is okay.   Your health care provider may test you for infection again 3 months after treatment.    How is this prevented?  Take these steps to reduce your risk of getting an STD:   Use latex condoms, dental dams, and water-soluble lubricants during sexual activity. Do not use   petroleum jelly or oils.   Avoid having multiple sex partners.   Do not have sex with someone who has other sex partners.   Do not have sex with anyone you do not know or who is at high risk for an STD.   Avoid risky sex practices that can break your skin.   Do not have sex if you have open sores on your mouth or skin.   Avoid drinking too much alcohol or taking illegal drugs. Alcohol and drugs can affect your judgment and put you in a vulnerable position.   Avoid engaging in oral and anal sex acts.   Get vaccinated for HPV and hepatitis. If you have not received these vaccines in the past, talk to your health care provider about whether one or  both might be right for you.   If you are at risk of being infected with HIV, it is recommended that you take a prescription medicine daily to prevent HIV infection. This is called pre-exposure prophylaxis (PrEP). You are considered at risk if:  ? You are a man who has sex with other men (MSM).  ? You are a heterosexual man or woman and are sexually active with more than one partner.  ? You take drugs by injection.  ? You are sexually active with a partner who has HIV.   Talk with your health care provider about whether you are at high risk of being infected with HIV. If you choose to begin PrEP, you should first be tested for HIV. You should then be tested every 3 months for as long as you are taking PrEP.    Contact a health care provider if:   See your health care provider.   Tell your sexual partner(s). They should be tested and treated for any STDs.   Do not have sex until your health care provider says it is okay.  Get help right away if:  Contact your health care provider right away if:   You have severe abdominal pain.   You are a man and notice swelling or pain in your testicles.   You are a woman and notice swelling or pain in your vagina.    This information is not intended to replace advice given to you by your health care provider. Make sure you discuss any questions you have with your health care provider.  Document Released: 05/02/2002 Document Revised: 08/30/2015 Document Reviewed: 08/30/2012  Elsevier Interactive Patient Education  2018 [employer]

## 2016-08-17 ENCOUNTER — Other Ambulatory Visit: Payer: Self-pay | Admitting: Internal Medicine

## 2016-08-20 ENCOUNTER — Ambulatory Visit (AMBULATORY_SURGERY_CENTER): Payer: Self-pay

## 2016-08-20 VITALS — Ht 68.0 in | Wt 193.8 lb

## 2016-08-20 DIAGNOSIS — Z1211 Encounter for screening for malignant neoplasm of colon: Secondary | ICD-10-CM

## 2016-08-20 MED ORDER — SUPREP BOWEL PREP KIT 17.5-3.13-1.6 GM/177ML PO SOLN: 1.0000 | Freq: Once | ORAL | 0 refills | Status: AC

## 2016-08-20 NOTE — Progress Notes (Signed)
No allergies to eggs or soy No diet meds No home oxygen No past problems with anesthesia  Registered emmi 

## 2016-08-21 ENCOUNTER — Encounter: Payer: Self-pay | Admitting: Gastroenterology

## 2016-09-04 ENCOUNTER — Encounter: Payer: Self-pay | Admitting: Gastroenterology

## 2016-09-04 ENCOUNTER — Ambulatory Visit (AMBULATORY_SURGERY_CENTER): Payer: BLUE CROSS/BLUE SHIELD | Admitting: Gastroenterology

## 2016-09-04 VITALS — BP 121/87 | HR 59 | Temp 97.3°F | Resp 16 | Ht 68.0 in | Wt 193.0 lb

## 2016-09-04 DIAGNOSIS — Z1211 Encounter for screening for malignant neoplasm of colon: Secondary | ICD-10-CM | POA: Diagnosis not present

## 2016-09-04 DIAGNOSIS — D122 Benign neoplasm of ascending colon: Secondary | ICD-10-CM

## 2016-09-04 DIAGNOSIS — Z1212 Encounter for screening for malignant neoplasm of rectum: Secondary | ICD-10-CM

## 2016-09-04 DIAGNOSIS — D123 Benign neoplasm of transverse colon: Secondary | ICD-10-CM | POA: Diagnosis not present

## 2016-09-04 DIAGNOSIS — K635 Polyp of colon: Secondary | ICD-10-CM

## 2016-09-04 MED ORDER — SODIUM CHLORIDE 0.9 % IV SOLN: 500.0000 mL | INTRAVENOUS | Status: DC

## 2016-09-04 NOTE — Progress Notes (Signed)
Pt's states no medical or surgical changes since previsit or office visit. 

## 2016-09-04 NOTE — Progress Notes (Signed)
Alert and oriented x3, pleased with MAC, report to RN Judson Roch

## 2016-09-04 NOTE — Op Note (Signed)
[place] Patient Name: Caleb Johns Procedure Date: 09/04/2016 11:51 AM MRN: [id number] Endoscopist: Remo Lipps P. Armbruster MD, MD Age: 61 Referring MD:  Date of Birth: [birth year] Gender: Male Account #: [id number] Procedure:                Colonoscopy Indications:              Screening for colorectal malignant neoplasm Medicines:                Monitored Anesthesia Care Procedure:                Pre-Anesthesia Assessment:                           - Prior to the procedure, a History and Physical                            was performed, and patient medications and                            allergies were reviewed. The patient's tolerance of                            previous anesthesia was also reviewed. The risks                            and benefits of the procedure and the sedation                            options and risks were discussed with the patient.                            All questions were answered, and informed consent                            was obtained. Prior Anticoagulants: The patient has                            taken aspirin, last dose was 1 day prior to                            procedure. ASA Grade Assessment: II - A patient                            with mild systemic disease. After reviewing the                            risks and benefits, the patient was deemed in                            satisfactory condition to undergo the procedure.                           After obtaining informed consent, the colonoscope  was passed under direct vision. Throughout the                            procedure, the patient's blood pressure, pulse, and                            oxygen saturations were monitored continuously. The                            Colonoscope was introduced through the anus and                            advanced to the the cecum, identified by                            appendiceal orifice and  ileocecal valve. The                            colonoscopy was performed without difficulty. The                            patient tolerated the procedure well. The quality                            of the bowel preparation was good. The ileocecal                            valve, appendiceal orifice, and rectum were                            photographed. Scope In: 12:01:51 PM Scope Out: 12:26:09 PM Scope Withdrawal Time: 0 hours 22 minutes 15 seconds  Total Procedure Duration: 0 hours 24 minutes 18 seconds  Findings:                 The perianal and digital rectal examinations were                            normal.                           Three sessile polyps were found in the ascending                            colon. The polyps were 54mm, 39mm, and 15 mm in size.                            The largest was located behind a fold and                            technically challenging to remove. These polyps                            were removed with a cold snare. Resection and  retrieval were complete.                           A 5 mm polyp was found in the transverse colon. The                            polyp was sessile. The polyp was removed with a                            cold snare. Resection and retrieval were complete.                           A few small-mouthed diverticula were found in the                            left colon.                           Internal hemorrhoids were found during retroflexion.                           The exam was otherwise without abnormality. Complications:            No immediate complications. Estimated blood loss:                            Minimal. Estimated Blood Loss:     Estimated blood loss was minimal. Impression:               - Three 5 to 15 mm polyps in the ascending colon,                            removed with a cold snare. Resected and retrieved.                           - One 5 mm polyp in  the transverse colon, removed                            with a cold snare. Resected and retrieved.                           - Diverticulosis in the left colon.                           - Internal hemorrhoids.                           - The examination was otherwise normal. Recommendation:           - Patient has a contact number available for                            emergencies. The signs and symptoms of potential  delayed complications were discussed with the                            patient. Return to normal activities tomorrow.                            Written discharge instructions were provided to the                            patient.                           - Resume previous diet.                           - Continue present medications.                           - Await pathology results.                           - Repeat colonoscopy is recommended for                            surveillance. The colonoscopy date will be                            determined after pathology results from today's                            exam become available for review.                           - No ibuprofen, naproxen, or other non-steroidal                            anti-inflammatory drugs for 2 weeks after polyp                            removal. Remo Lipps P. Armbruster MD, MD 09/04/2016 12:31:25 PM This report has been signed electronically.

## 2016-09-04 NOTE — Patient Instructions (Signed)
  Information on polyps,diverticulosis,and hemorrhoids given to you today  NO ASPIRIN, ASPIRIN CONTAINING PRODUCTS (BC OR GOODY POWDERS) OR NSAIDS (IBUPROFEN, ADVIL, ALEVE, AND MOTRIN) FOR 2 WEEKS.  TYLENOL IS OK TO TAKE  AWAIT PATHOLOGY RESULTS ON POLYPS REMOVED     YOU HAD AN ENDOSCOPIC PROCEDURE TODAY AT [facility]:   Refer to the procedure report that was given to you for any specific questions about what was found during the examination.  If the procedure report does not answer your questions, please call your gastroenterologist to clarify.  If you requested that your care partner not be given the details of your procedure findings, then the procedure report has been included in a sealed envelope for you to review at your convenience later.  YOU SHOULD EXPECT: Some feelings of bloating in the abdomen. Passage of more gas than usual.  Walking can help get rid of the air that was put into your GI tract during the procedure and reduce the bloating. If you had a lower endoscopy (such as a colonoscopy or flexible sigmoidoscopy) you may notice spotting of blood in your stool or on the toilet paper. If you underwent a bowel prep for your procedure, you may not have a normal bowel movement for a few days.  Please Note:  You might notice some irritation and congestion in your nose or some drainage.  This is from the oxygen used during your procedure.  There is no need for concern and it should clear up in a day or so.  SYMPTOMS TO REPORT IMMEDIATELY:   Following lower endoscopy (colonoscopy or flexible sigmoidoscopy):  Excessive amounts of blood in the stool  Significant tenderness or worsening of abdominal pains  Swelling of the abdomen that is new, acute  Fever of 100F or higher    For urgent or emergent issues, a gastroenterologist can be reached at any hour by calling [phone].   DIET:  We do recommend a small meal at first, but then you may proceed to your  regular diet.  Drink plenty of fluids but you should avoid alcoholic beverages for 24 hours.  ACTIVITY:  You should plan to take it easy for the rest of today and you should NOT DRIVE or use heavy machinery until tomorrow (because of the sedation medicines used during the test).    FOLLOW UP: Our staff will call the number listed on your records the next business day following your procedure to check on you and address any questions or concerns that you may have regarding the information given to you following your procedure. If we do not reach you, we will leave a message.  However, if you are feeling well and you are not experiencing any problems, there is no need to return our call.  We will assume that you have returned to your regular daily activities without incident.  If any biopsies were taken you will be contacted by phone or by letter within the next 1-3 weeks.  Please call us at [phone] if you have not heard about the biopsies in 3 weeks.    SIGNATURES/CONFIDENTIALITY: You and/or your care partner have signed paperwork which will be entered into your electronic medical record.  These signatures attest to the fact that that the information above on your After Visit Summary has been reviewed and is understood.  Full responsibility of the confidentiality of this discharge information lies with you and/or your care-partner.

## 2016-09-04 NOTE — Progress Notes (Signed)
Called to room to assist during endoscopic procedure.  Patient ID and intended procedure confirmed with present staff. Received instructions for my participation in the procedure from the performing physician.  

## 2016-09-07 ENCOUNTER — Telehealth: Payer: Self-pay | Admitting: *Deleted

## 2016-09-07 NOTE — Telephone Encounter (Signed)
Patient returning phone call to nurse states he is doing fine.

## 2016-09-07 NOTE — Telephone Encounter (Signed)
  Follow up Call-  Call back number 09/04/2016  Post procedure Call Back phone  # [phone]  Permission to leave phone message Yes  Some recent data might be hidden     Patient questions:  Do you have a fever, pain , or abdominal swelling? No. Pain Score  0 *  Have you tolerated food without any problems? Yes.    Have you been able to return to your normal activities? Yes.    Do you have any questions about your discharge instructions: Diet   No. Medications  No. Follow up visit  No.  Do you have questions or concerns about your Care? No.  Actions: * If pain score is 4 or above: No action needed, pain <4.

## 2016-09-07 NOTE — Telephone Encounter (Signed)
  Follow up Call-  Call back number 09/04/2016  Post procedure Call Back phone  # [phone]  Permission to leave phone message Yes  Some recent data might be hidden     Patient questions:  Message left to call us if necessary.

## 2016-09-11 ENCOUNTER — Encounter: Payer: Self-pay | Admitting: Gastroenterology

## 2016-09-24 ENCOUNTER — Ambulatory Visit: Payer: Self-pay | Admitting: Internal Medicine

## 2016-09-24 ENCOUNTER — Ambulatory Visit (INDEPENDENT_AMBULATORY_CARE_PROVIDER_SITE_OTHER): Payer: BLUE CROSS/BLUE SHIELD | Admitting: Internal Medicine

## 2016-09-24 VITALS — BP 112/68 | HR 60 | Temp 97.1°F | Resp 16 | Ht 68.0 in | Wt 197.0 lb

## 2016-09-24 DIAGNOSIS — M109 Gout, unspecified: Secondary | ICD-10-CM

## 2016-09-24 DIAGNOSIS — Z79899 Other long term (current) drug therapy: Secondary | ICD-10-CM

## 2016-09-24 DIAGNOSIS — I10 Essential (primary) hypertension: Secondary | ICD-10-CM

## 2016-09-24 DIAGNOSIS — E782 Mixed hyperlipidemia: Secondary | ICD-10-CM

## 2016-09-24 DIAGNOSIS — E559 Vitamin D deficiency, unspecified: Secondary | ICD-10-CM | POA: Diagnosis not present

## 2016-09-24 DIAGNOSIS — E349 Endocrine disorder, unspecified: Secondary | ICD-10-CM | POA: Diagnosis not present

## 2016-09-24 DIAGNOSIS — R7309 Other abnormal glucose: Secondary | ICD-10-CM

## 2016-09-24 NOTE — Patient Instructions (Signed)

## 2016-09-25 ENCOUNTER — Encounter: Payer: Self-pay | Admitting: Internal Medicine

## 2016-09-25 LAB — CBC WITH DIFFERENTIAL/PLATELET
Basophils Absolute: 51 cells/uL (ref 0–200)
Basophils Relative: 1 %
Eosinophils Absolute: 408 cells/uL (ref 15–500)
Eosinophils Relative: 8 %
HEMATOCRIT: 43 % (ref 38.5–50.0)
HEMOGLOBIN: 14.4 g/dL (ref 13.2–17.1)
LYMPHS ABS: 2550 {cells}/uL (ref 850–3900)
Lymphocytes Relative: 50 %
MCH: 31 pg (ref 27.0–33.0)
MCHC: 33.5 g/dL (ref 32.0–36.0)
MCV: 92.7 fL (ref 80.0–100.0)
MONO ABS: 459 {cells}/uL (ref 200–950)
MPV: 11.6 fL (ref 7.5–12.5)
Monocytes Relative: 9 %
NEUTROS ABS: 1632 {cells}/uL (ref 1500–7800)
NEUTROS PCT: 32 %
Platelets: 162 10*3/uL (ref 140–400)
RBC: 4.64 MIL/uL (ref 4.20–5.80)
RDW: 13.7 % (ref 11.0–15.0)
WBC: 5.1 10*3/uL (ref 3.8–10.8)

## 2016-09-25 LAB — LIPID PANEL
Cholesterol: 145 mg/dL (ref <200)
HDL: 41 mg/dL (ref >40)
LDL CALC: 77 mg/dL (ref <100)
Total CHOL/HDL Ratio: 3.5 Ratio (ref <5.0)
Triglycerides: 134 mg/dL (ref <150)
VLDL: 27 mg/dL (ref <30)

## 2016-09-25 LAB — BASIC METABOLIC PANEL WITH GFR
BUN: 10 mg/dL (ref 7–25)
CALCIUM: 9.2 mg/dL (ref 8.6–10.3)
CHLORIDE: 102 mmol/L (ref 98–110)
CO2: 25 mmol/L (ref 20–31)
Creat: 0.97 mg/dL (ref 0.70–1.25)
GFR, Est African American: >89 mL/min (ref >=60)
GFR, Est Non African American: 84 mL/min (ref >=60)
GLUCOSE: 90 mg/dL (ref 65–99)
POTASSIUM: 3.6 mmol/L (ref 3.5–5.3)
SODIUM: 140 mmol/L (ref 135–146)

## 2016-09-25 LAB — TESTOSTERONE: TESTOSTERONE: 433 ng/dL (ref 250–827)

## 2016-09-25 LAB — HEPATIC FUNCTION PANEL
ALK PHOS: 89 U/L (ref 40–115)
ALT: 13 U/L (ref 9–46)
AST: 21 U/L (ref 10–35)
Albumin: 4.1 g/dL (ref 3.6–5.1)
BILIRUBIN INDIRECT: 0.3 mg/dL (ref 0.2–1.2)
Bilirubin, Direct: 0.1 mg/dL (ref <=0.2)
TOTAL PROTEIN: 6.3 g/dL (ref 6.1–8.1)
Total Bilirubin: 0.4 mg/dL (ref 0.2–1.2)

## 2016-09-25 LAB — TSH: TSH: 1.3 m[IU]/L (ref 0.40–4.50)

## 2016-09-25 LAB — MAGNESIUM: Magnesium: 2 mg/dL (ref 1.5–2.5)

## 2016-09-25 LAB — HEMOGLOBIN A1C
HEMOGLOBIN A1C: 5.5 % (ref <5.7)
Mean Plasma Glucose: 111 mg/dL

## 2016-09-25 LAB — INSULIN, RANDOM: Insulin: 10.7 u[IU]/mL (ref 2.0–19.6)

## 2016-09-25 LAB — URIC ACID: URIC ACID, SERUM: 4.1 mg/dL (ref 4.0–8.0)

## 2016-09-25 LAB — VITAMIN D 25 HYDROXY (VIT D DEFICIENCY, FRACTURES): VIT D 25 HYDROXY: 89 ng/mL (ref 30–100)

## 2016-09-25 NOTE — Progress Notes (Signed)
This very nice 61 y.o.  DBM presents for 3 month follow up with Hypertension, Hyperlipidemia, Pre-Diabetes and Vitamin D Deficiency.      Patient is treated for HTN (2004) & BP has been controlled at home. Today's BP is at goal - 112/68. Patient has had no complaints of any cardiac type chest pain, palpitations, dyspnea/orthopnea/PND, dizziness, claudication, or dependent edema.     Hyperlipidemia is controlled with diet & meds. Patient denies myalgias or other med SE's. Last Lipids were at goal: Lab Results  Component Value Date   CHOL 144 03/26/2016   HDL 37 (L) 03/26/2016   LDLCALC 85 03/26/2016   TRIG 109 03/26/2016   CHOLHDL 3.9 03/26/2016      Also, the patient has history of PreDiabetes (A1c 6.4% in 2009/A1c 5.9% in 2016) and has had no symptoms of reactive hypoglycemia, diabetic polys, paresthesias or visual blurring.  Last A1c was at goal: Lab Results  Component Value Date   HGBA1C 5.5 03/26/2016      Patient ha hx/o Low Testosterone. Further, the patient also has history of Vitamin D Deficiency ("21" in 2008) and supplements vitamin D without any suspected side-effects. Last vitamin D was neared goal (70-100):   Lab Results  Component Value Date   VD25OH 54 03/26/2016   Current Outpatient Prescriptions on File Prior to Visit  Medication Sig  . acyclovir (ZOVIRAX) 800 MG tablet Take 1 tablet (800 mg total) by mouth daily.  Marland Kitchen allopurinol (ZYLOPRIM) 300 MG tablet TAKE ONE TABLET BY MOUTH ONCE DAILY FOR  GOUT  . alum & mag hydroxide-simeth (MAALOX/MYLANTA) 200-200-20 MG/5ML suspension Take 30 mLs by mouth 2 (two) times daily.  Marland Kitchen aspirin EC 81 MG tablet Take 81 mg by mouth daily.  Marland Kitchen atenolol (TENORMIN) 100 MG tablet TAKE ONE TABLET BY MOUTH ONCE DAILY FOR BLOOD PRESSURE  . B Complex-C (SUPER B COMPLEX PO) Take 1 tablet by mouth daily.  . Cholecalciferol (VITAMIN D) 2000 UNITS CAPS Take 2,000 Units by mouth daily.  . Flaxseed, Linseed, (FLAX SEED OIL) 1000 MG CAPS Take  1,000 mg by mouth daily.  . fluticasone (FLONASE) 50 MCG/ACT nasal spray USE TWO SPRAY(S) IN EACH NOSTRIL ONCE DAILY  . glucose blood (FREESTYLE TEST STRIPS) test strip Use as instructed, DX 250.00  . hydrochlorothiazide (HYDRODIURIL) 25 MG tablet TAKE ONE TABLET BY MOUTH ONCE DAILY  . Lancets (FREESTYLE) lancets   . loratadine (CLARITIN) 10 MG tablet Take 1 tablet daily for allergies  . Omega-3 Fatty Acids (FISH OIL) 1000 MG CAPS Take 1,000 mg by mouth daily.  . pravastatin (PRAVACHOL) 40 MG tablet TAKE ONE TABLET BY MOUTH ONCE DAILY  . ranitidine (ZANTAC) 300 MG tablet TAKE ONE TABLET BY MOUTH TWICE DAILY  . sildenafil (REVATIO) 20 MG tablet Take 1-5 tablets daily PRN XXXX.  Marland Kitchen sildenafil (VIAGRA) 100 MG tablet Take 1 tablet (100 mg total) by mouth as needed for erectile dysfunction.  . triamcinolone cream (KENALOG) 0.1 % Apply to rash 3 to 4 x daily if needed   No current facility-administered medications on file prior to visit.    Allergies  Allergen Reactions  . Ace Inhibitors     REACTION: cough  . Trilipix [Choline Fenofibrate] Other (See Comments)    Heart burn   PMHx:   Past Medical History:  Diagnosis Date  . GERD (gastroesophageal reflux disease)   . Gout   . HSV-1 (herpes simplex virus 1) infection   . HSV-2 (herpes simplex virus 2)  infection   . Hyperlipidemia   . Hypertension   . Hypogonadism male   . Obese   . Pneumonia    walking -many years ago  . Pre-diabetes    Immunization History  Administered Date(s) Administered  . PPD Test 02/13/2014  . Pneumococcal Polysaccharide-23 02/23/2002  . Tdap 05/15/2009   Past Surgical History:  Procedure Laterality Date  . BACK SURGERY  2012   on back-hernia  . HERNIA REPAIR    . INSERTION OF MESH N/A 08/15/2013   Procedure: INSERTION OF MESH;  Surgeon: Odis Hollingshead, MD;  Location: [facility];  Service: General;  Laterality: N/A;  . LAPAROSCOPIC INGUINAL HERNIA WITH UMBILICAL HERNIA Right 4/00/8676   Procedure:  LAPAROSCOPIC RIGH  INGUINAL HERNIA WITH OPEN UMBILICAL HERNIA;  Surgeon: Odis Hollingshead, MD;  Location: [facility];  Service: General;  Laterality: Right;  umbilical repair with mesh   FHx:    Reviewed / unchanged  SHx:    Reviewed / unchanged  Systems Review:  Constitutional: Denies fever, chills, wt changes, headaches, insomnia, fatigue, night sweats, change in appetite. Eyes: Denies redness, blurred vision, diplopia, discharge, itchy, watery eyes.  ENT: Denies discharge, congestion, post nasal drip, epistaxis, sore throat, earache, hearing loss, dental pain, tinnitus, vertigo, sinus pain, snoring.  CV: Denies chest pain, palpitations, irregular heartbeat, syncope, dyspnea, diaphoresis, orthopnea, PND, claudication or edema. Respiratory: denies cough, dyspnea, DOE, pleurisy, hoarseness, laryngitis, wheezing.  Gastrointestinal: Denies dysphagia, odynophagia, heartburn, reflux, water brash, abdominal pain or cramps, nausea, vomiting, bloating, diarrhea, constipation, hematemesis, melena, hematochezia  or hemorrhoids. Genitourinary: Denies dysuria, frequency, urgency, nocturia, hesitancy, discharge, hematuria or flank pain. Musculoskeletal: Denies arthralgias, myalgias, stiffness, jt. swelling, pain, limping or strain/sprain.  Skin: Denies pruritus, rash, hives, warts, acne, eczema or change in skin lesion(s). Neuro: No weakness, tremor, incoordination, spasms, paresthesia or pain. Psychiatric: Denies confusion, memory loss or sensory loss. Endo: Denies change in weight, skin or hair change.  Heme/Lymph: No excessive bleeding, bruising or enlarged lymph nodes.  Physical Exam  BP 112/68   Pulse 60   Temp (!) 97.1 F (36.2 C)   Resp 16   Ht 5\' 8"  (1.727 m)   Wt 197 lb (89.4 kg)   BMI 29.95 kg/m   Appears well nourished, well groomed  and in no distress.  Eyes: PERRLA, EOMs, conjunctiva no swelling or erythema. Sinuses: No frontal/maxillary tenderness ENT/Mouth: EAC's clear, TM's nl  w/o erythema, bulging. Nares clear w/o erythema, swelling, exudates. Oropharynx clear without erythema or exudates. Oral hygiene is good. Tongue normal, non obstructing. Hearing intact.  Neck: Supple. Thyroid nl. Car 2+/2+ without bruits, nodes or JVD. Chest: Respirations nl with BS clear & equal w/o rales, rhonchi, wheezing or stridor.  Cor: Heart sounds normal w/ regular rate and rhythm without sig. murmurs, gallops, clicks or rubs. Peripheral pulses normal and equal  without edema.  Abdomen: Soft & bowel sounds normal. Non-tender w/o guarding, rebound, hernias, masses or organomegaly.  Lymphatics: Unremarkable.  Musculoskeletal: Full ROM all peripheral extremities, joint stability, 5/5 strength and normal gait.  Skin: Warm, dry without exposed rashes, lesions or ecchymosis apparent.  Neuro: Cranial nerves intact, reflexes equal bilaterally. Sensory-motor testing grossly intact. Tendon reflexes grossly intact.  Pysch: Alert & oriented x 3.  Insight and judgement nl & appropriate. No ideations.  Assessment and Plan:  1. Essential hypertension  - Continue medication, monitor blood pressure at home.  - Continue DASH diet. Reminder to go to the ER if any CP,  SOB, nausea, dizziness, severe  HA, changes vision/speech.  - CBC with Differential/Platelet - BASIC METABOLIC PANEL WITH GFR - Magnesium  2. Hyperlipidemia, mixed  - Continue diet/meds, exercise,& lifestyle modifications.  - Continue monitor periodic cholesterol/liver & renal functions   - Hepatic function panel - Lipid panel - Hemoglobin A1c - Insulin, random  3. Vitamin D deficiency  - Continue diet, exercise, lifestyle modifications.  - Monitor appropriate labs.  - VITAMIN D 25 Hydroxy   4. Testosterone deficiency  - Continue supplementation.  - Testosterone  5. Gout  - Uric acid  6. Medication management  - CBC with Differential/Platelet - BASIC METABOLIC PANEL WITH GFR - Hepatic function panel -  Magnesium - Lipid panel - TSH - Hemoglobin A1c - Insulin, random - VITAMIN D 25 Hydroxy - Testosterone - Uric acid      Discussed  regular exercise, BP monitoring, weight control to achieve/maintain BMI less than 25 and discussed med and SE's. Recommended labs to assess and monitor clinical status with further disposition pending results of labs. Over 30 minutes of exam, counseling, chart review was performed.

## 2016-10-07 ENCOUNTER — Other Ambulatory Visit: Payer: Self-pay | Admitting: *Deleted

## 2016-10-07 MED ORDER — TAMSULOSIN HCL 0.4 MG PO CAPS: 0.4000 mg | ORAL_CAPSULE | Freq: Every day | ORAL | 1 refills | Status: DC

## 2016-12-09 ENCOUNTER — Other Ambulatory Visit: Payer: Self-pay | Admitting: Internal Medicine

## 2016-12-29 NOTE — Progress Notes (Addendum)
Assessment and Plan:  Caleb Johns was seen today for herpes simplex.  Diagnoses and all orders for this visit:  Herpes simplex infection -     acyclovir (ZOVIRAX) 800 MG tablet; Take 1 tablet (800 mg total) daily by mouth. -     Established HSV 1 and HSV 2 infection -     Discussed safe sex; use of condoms  Encounter for assessment of STD exposure -     C. trachomatis/N. gonorrhoeae RNA -     RPR -     HIV antibody  Further disposition pending results of labs. Discussed med's effects and SE's.   Over 15 minutes of exam, counseling, chart review, and critical decision making was performed.   Future Appointments  Date Time Provider [place]  01/13/2017  4:30 PM Vicie Mutters, PA-C [id number] None  03/29/2017  2:00 PM Vicie Mutters, PA-C [facility] None    ------------------------------------------------------------------------------------------------------------------   HPI 61 y.o.male presents for concerns about herpes recurrence. He reports his current girlfriend of 8 years is currently having an outbreak and has started on acyclovir therapy. He has an established history of both HSV-1 and HSV-2 with previous excision of lesions. He denies any lesions or prodrome symptoms currently - he reports he normally experiences a burning sensation prior to lesions erupting. He has been prescribed acyclovir 800 mg daily for prophylaxis but he reports he has been out of this medication for several months and has not bothered to refill. He is also requesting STD testing today.    Past Medical History:  Diagnosis Date  . GERD (gastroesophageal reflux disease)   . Gout   . HSV-1 (herpes simplex virus 1) infection   . HSV-2 (herpes simplex virus 2) infection   . Hyperlipidemia   . Hypertension   . Hypogonadism male   . Obese   . Pneumonia    walking -many years ago  . Pre-diabetes      Allergies  Allergen Reactions  . Ace Inhibitors     REACTION: cough  . Trilipix [Choline  Fenofibrate] Other (See Comments)    Heart burn    Current Outpatient Medications on File Prior to Visit  Medication Sig  . allopurinol (ZYLOPRIM) 300 MG tablet TAKE ONE TABLET BY MOUTH ONCE DAILY FOR  GOUT  . alum & mag hydroxide-simeth (MAALOX/MYLANTA) 200-200-20 MG/5ML suspension Take 30 mLs by mouth 2 (two) times daily.  Marland Kitchen aspirin EC 81 MG tablet Take 81 mg by mouth daily.  . B Complex-C (SUPER B COMPLEX PO) Take 1 tablet by mouth daily.  . Cholecalciferol (VITAMIN D) 2000 UNITS CAPS Take 2,000 Units by mouth daily.  . Flaxseed, Linseed, (FLAX SEED OIL) 1000 MG CAPS Take 1,000 mg by mouth daily.  . fluticasone (FLONASE) 50 MCG/ACT nasal spray USE TWO SPRAY(S) IN EACH NOSTRIL ONCE DAILY  . glucose blood (FREESTYLE TEST STRIPS) test strip Use as instructed, DX 250.00  . hydrochlorothiazide (HYDRODIURIL) 25 MG tablet TAKE 1 TABLET BY MOUTH ONCE DAILY  . Lancets (FREESTYLE) lancets   . loratadine (CLARITIN) 10 MG tablet Take 1 tablet daily for allergies  . Omega-3 Fatty Acids (FISH OIL) 1000 MG CAPS Take 1,000 mg by mouth daily.  . pravastatin (PRAVACHOL) 40 MG tablet TAKE ONE TABLET BY MOUTH ONCE DAILY  . ranitidine (ZANTAC) 300 MG tablet TAKE ONE TABLET BY MOUTH TWICE DAILY (Patient taking differently: as needed)  . sildenafil (VIAGRA) 100 MG tablet Take 1 tablet (100 mg total) by mouth as needed for erectile dysfunction.  Marland Kitchen  tamsulosin (FLOMAX) 0.4 MG CAPS capsule Take 1 capsule (0.4 mg total) by mouth at bedtime.  Marland Kitchen atenolol (TENORMIN) 100 MG tablet TAKE ONE TABLET BY MOUTH ONCE DAILY FOR BLOOD PRESSURE  . sildenafil (REVATIO) 20 MG tablet Take 1-5 tablets daily PRN XXXX. (Patient not taking: Reported on 12/30/2016)  . triamcinolone cream (KENALOG) 0.1 % Apply to rash 3 to 4 x daily if needed (Patient not taking: Reported on 12/30/2016)   No current facility-administered medications on file prior to visit.     ROS: Review of Systems  Constitutional: Negative for chills, diaphoresis,  fever, malaise/fatigue and weight loss.  HENT: Negative.  Negative for sore throat.   Eyes: Negative.  Negative for blurred vision, pain and discharge.  Respiratory: Negative.  Negative for cough.   Cardiovascular: Negative.   Gastrointestinal: Negative for abdominal pain, diarrhea, nausea and vomiting.  Genitourinary: Negative.  Negative for dysuria, frequency, hematuria and urgency.  Musculoskeletal: Negative for joint pain and myalgias.  Skin: Negative for itching and rash.  Neurological: Negative for dizziness, tingling, sensory change, weakness and headaches.  Endo/Heme/Allergies: Negative for polydipsia.  Psychiatric/Behavioral: Negative.   All other systems reviewed and are negative.   Physical Exam:  BP 120/86   Pulse 70   Temp (!) 97.3 F (36.3 C)   Ht 5\' 8"  (1.727 m)   Wt 196 lb 9.6 oz (89.2 kg)   SpO2 97%   BMI 29.89 kg/m   General Appearance: Well nourished, in no apparent distress. Eyes: PERRLA, conjunctiva no swelling or erythema ENT/Mouth:  No erythema, swelling, or exudate on post pharynx.  Tonsils not swollen or erythematous. Hearing normal.  Neck: Supple Respiratory: Respiratory effort normal, BS equal bilaterally without rales, rhonchi, wheezing or stridor.  Cardio: RRR with no MRGs. Brisk peripheral pulses. Abdomen: Soft, + BS.  Non tender, no guarding, rebound, hernias, masses. Lymphatics: Non tender without lymphadenopathy.  Musculoskeletal: normal gait.  Skin: Warm, dry without rashes, lesions, ecchymosis.   Psych: Awake and oriented X 3, normal affect, Insight and Judgment appropriate.   Izora Ribas, NP 5:11 PM [facility]

## 2016-12-30 ENCOUNTER — Encounter: Payer: Self-pay | Admitting: Adult Health

## 2016-12-30 ENCOUNTER — Ambulatory Visit: Payer: BLUE CROSS/BLUE SHIELD | Admitting: Adult Health

## 2016-12-30 VITALS — BP 120/86 | HR 70 | Temp 97.3°F | Ht 68.0 in | Wt 196.6 lb

## 2016-12-30 DIAGNOSIS — B009 Herpesviral infection, unspecified: Secondary | ICD-10-CM | POA: Diagnosis not present

## 2016-12-30 DIAGNOSIS — Z7689 Persons encountering health services in other specified circumstances: Secondary | ICD-10-CM | POA: Diagnosis not present

## 2016-12-30 MED ORDER — ACYCLOVIR 800 MG PO TABS: 800.0000 mg | ORAL_TABLET | Freq: Every day | ORAL | 1 refills | Status: DC

## 2016-12-30 NOTE — Patient Instructions (Addendum)
May take acyclovir 800 mg daily for prevention, or start taking with prodrome "burning sensation" and take for 5 days by which time symptoms should clear up.   Practice safe sex; use condoms with any genital contact.   Acyclovir tablets or capsules What is this medicine? ACYCLOVIR (ay SYE kloe veer) is an antiviral medicine. It is used to treat or prevent infections caused by certain kinds of viruses. Examples of these infections include herpes and shingles. This medicine will not cure herpes. This medicine may be used for other purposes; ask your health care provider or pharmacist if you have questions. COMMON BRAND NAME(S): Zovirax What should I tell my health care provider before I take this medicine? They need to know if you have any of these conditions: -kidney disease -an unusual or allergic reaction to acyclovir, ganciclovir, valacyclovir, other medicines, foods, dyes, or preservatives -pregnant or trying to get pregnant -breast-feeding How should I use this medicine? Take this medicine by mouth with a glass of water. Follow the directions on the prescription label. You can take it with or without food. Take your medicine at regular intervals. Do not take your medicine more often than directed. Take all of your medicine as directed even if you think your are better. Do not skip doses or stop your medicine early. Talk to your pediatrician regarding the use of this medicine in children. While this drug may be prescribed for selected conditions, precautions do apply. Overdosage: If you think you have taken too much of this medicine contact a poison control center or emergency room at once. NOTE: This medicine is only for you. Do not share this medicine with others. What if I miss a dose? If you miss a dose, take it as soon as you can. If it is almost time for your next dose, take only that dose. Do not take double or extra doses. What may interact with this medicine? -probenecid This list  may not describe all possible interactions. Give your health care provider a list of all the medicines, herbs, non-prescription drugs, or dietary supplements you use. Also tell them if you smoke, drink alcohol, or use illegal drugs. Some items may interact with your medicine. What should I watch for while using this medicine? Tell your doctor or health care professional if your symptoms do not improve. This medicine works best when started very early in the course of an infection. Begin treatment at the first signs of infection. Drink 6 to 8 glasses of water or fluids every day while you are taking this medicine. This will help prevent side effects. You can still pass chickenpox, shingles, or herpes to another person even while you are taking this medicine. Avoid contact with others as directed. Genital herpes is a sexually transmitted disease. Talk to your doctor about how to stop the spread of infection. What side effects may I notice from receiving this medicine? Side effects that you should report to your doctor or health care professional as soon as possible: -allergic reactions like skin rash, itching or hives, swelling of the face, lips, or tongue -chest pain -confusion, hallucinations, tremor -dark urine -increased sensitivity to the sun -redness, blistering, peeling or loosening of the skin, including inside the mouth -seizures -trouble passing urine or change in the amount of urine -unusual bleeding or bruising, or pinpoint red spots on the skin -unusually weak or tired -yellowing of the eyes or skin Side effects that usually do not require medical attention (report to your doctor or health  care professional if they continue or are bothersome): -diarrhea -fever -headache -nausea, vomiting -stomach upset This list may not describe all possible side effects. Call your doctor for medical advice about side effects. You may report side effects to FDA at 1-800-FDA-1088. Where should I  keep my medicine? Keep out of the reach of children. Store at room temperature between 15 and 25 degrees C (59 and 77 degrees F). Throw away any unused medicine after the expiration date. NOTE: This sheet is a summary. It may not cover all possible information. If you have questions about this medicine, talk to your doctor, pharmacist, or health care provider.  2018 Elsevier/Gold Standard (2007-04-27 13:15:46) Genital Warts Genital warts are a common STD (sexually transmitted disease). They may appear as small bumps on the tissues of the genital area or anal area. Sometimes, they can become irritated and cause pain. Genital warts are easily passed to other people through sexual contact. Getting treatment is important because genital warts can lead to other problems. In females, the virus that causes genital warts may increase the risk of cervical cancer. What are the causes? Genital warts are caused by a virus that is called human papillomavirus (HPV). HPV is spread by having unprotected sex with an infected person. It can be spread through vaginal, anal, and oral sex. Many people do not know that they are infected. They may be infected for years without problems. However, even if they do not have problems, they can pass the infection to their sexual partners. What increases the risk? Genital warts are more likely to develop in:  People who have unprotected sex.  People who have multiple sexual partners.  People who become sexually active before they are 60 years of age.  Men who are not circumcised.  Women who have a male sexual partner who is not circumcised.  People who have a weakened body defense system (immune system) due to disease or medicine.  People who smoke.  What are the signs or symptoms? Symptoms of genital warts include:  Small growths in the genital area or anal area. These warts often grow in clusters.  Itching and irritation in the genital area or anal  area.  Bleeding from the warts.  Painful sexual intercourse.  How is this diagnosed? Genital warts can usually be diagnosed from their appearance on the vagina, vulva, penis, perineum, anus, or rectum. Tests may also be done, such as:  Biopsy. A tissue sample is removed so it can be looked at under a microscope.  Colposcopy. In females, a magnifying tool is used to examine the vagina and cervix. Certain solutions may be used to make the HPV cells change color so they can be seen more easily.  A Pap test in females.  Tests for other STDs.  How is this treated? Treatment for genital warts may include:  Applying prescription medicines to the warts. These may be solutions or creams.  Freezing the warts with liquid nitrogen (cryotherapy).  Burning the warts with: ? Laser treatment. ? An electrified probe (electrocautery).  Injecting a substance (Candida antigen or Trichophyton antigen) into the warts to help the body's immune system to fight off the warts.  Interferon injections.  Surgery to remove the warts.  Follow these instructions at home: Medicines  Apply over-the-counter and prescription medicines only as told by your health care provider.  Do not treat genital warts with medicines that are used for treating hand warts.  Talk with your health care provider about using over-the-counter anti-itch  creams. General instructions  Do not touch or scratch the warts.  Do not have sex until your treatment has been completed.  Tell your current and past sexual partners about your condition because they may also need treatment.  Keep all follow-up visits as told by your health care provider. This is important.  After treatment, use condoms during sex to prevent future infections. Other Instructions for Women  Women who have genital warts might need increased screening for cervical cancer. This type of cancer is slow growing and can be cured if it is found early. Chances  of developing cervical cancer are increased with HPV.  If you become pregnant, tell your health care provider that you have had HPV. Your health care provider will monitor you closely during pregnancy to be sure that your baby is safe. How is this prevented? Talk with your health care provider about getting the HPV vaccines. These vaccines prevent some HPV infections and cancers. It is recommended that the vaccine be given to males and females who are 72-50 years of age. It will not work if you already have HPV, and it is not recommended for pregnant women. Contact a health care provider if:  You have redness, swelling, or pain in the area of the treated skin.  You have a fever.  You feel generally ill.  You feel lumps in and around your genital area or anal area.  You have bleeding in your genital area or anal area.  You have pain during sexual intercourse. This information is not intended to replace advice given to you by your health care provider. Make sure you discuss any questions you have with your health care provider. Document Released: 02/07/2000 Document Revised: 07/18/2015 Document Reviewed: 05/07/2014 Elsevier Interactive Patient Education  [employer].

## 2016-12-31 LAB — C. TRACHOMATIS/N. GONORRHOEAE RNA
C. TRACHOMATIS RNA, TMA: NOT DETECTED
N. gonorrhoeae RNA, TMA: NOT DETECTED

## 2016-12-31 LAB — HIV ANTIBODY (ROUTINE TESTING W REFLEX): HIV 1&2 Ab, 4th Generation: NONREACTIVE

## 2016-12-31 LAB — RPR: RPR Ser Ql: NONREACTIVE

## 2017-01-13 ENCOUNTER — Ambulatory Visit: Payer: Self-pay | Admitting: Physician Assistant

## 2017-01-27 NOTE — Progress Notes (Signed)
FOLLOW UP  Assessment and Plan:   Hypertension Well controlled with current medications  Monitor blood pressure at home; patient to call if consistently greater than 130/80 Continue DASH diet.   Reminder to go to the ER if any CP, SOB, nausea, dizziness, severe HA, changes vision/speech, left arm numbness and tingling and jaw pain.  Cholesterol Continue medication  Continue low cholesterol diet and exercise.  Check lipid panel.   Prediabetes Recently well controlled with normalized A1Cs  Continue diet/exercise, weight management  A1C  Overweight Long discussion about weight loss, diet, and exercise Recommended diet heavy in fruits and veggies and low in animal meats, cheeses, and dairy products, appropriate calorie intake Discussed appropriate weight for height  Follow up at next visit  Vitamin D Def/ osteoporosis prevention Continue supplementation Check Vit D level  Continue diet and meds as discussed. Further disposition pending results of labs. Discussed med's effects and SE's.   Over 30 minutes of exam, counseling, chart review, and critical decision making was performed.   Future Appointments  Date Time Provider [place]  03/29/2017  2:00 PM Vicie Mutters, PA-C [id number] None    ----------------------------------------------------------------------------------------------------------------------  HPI 61 y.o. very pleasant and friendly AA male  presents for 3 month follow up on hypertension, cholesterol, prediabetes, weight and vitamin D deficiency. He is doing well - has no complaints or concerns today.   BMI is Body mass index is 30.11 kg/m., he has been working on diet but not exercise - increasing fruit intake, greens from his garden - states he needs to keep working on vegetable intake Wt Readings from Last 3 Encounters:  01/28/17 198 lb (89.8 kg)  12/30/16 196 lb 9.6 oz (89.2 kg)  09/24/16 197 lb (89.4 kg)   His blood pressure has been  controlled at home, today their BP is BP: 120/82  He does not workout. He denies chest pain, shortness of breath, dizziness.   He is on cholesterol medication and denies myalgias. His cholesterol is at goal. The cholesterol last visit was:   Lab Results  Component Value Date   CHOL 145 09/24/2016   HDL 41 09/24/2016   LDLCALC 77 09/24/2016   TRIG 134 09/24/2016   CHOLHDL 3.5 09/24/2016    He has been working on diet and exercise for prediabetes, and denies nausea, paresthesia of the feet, polydipsia, polyuria, visual disturbances, vomiting and weight loss. Last A1C in the office was:  Lab Results  Component Value Date   HGBA1C 5.5 09/24/2016   Patient is on Vitamin D supplement.   Lab Results  Component Value Date   VD25OH 89 09/24/2016       Current Medications:  Current Outpatient Medications on File Prior to Visit  Medication Sig  . acyclovir (ZOVIRAX) 800 MG tablet Take 1 tablet (800 mg total) daily by mouth.  Marland Kitchen allopurinol (ZYLOPRIM) 300 MG tablet TAKE ONE TABLET BY MOUTH ONCE DAILY FOR  GOUT  . alum & mag hydroxide-simeth (MAALOX/MYLANTA) 200-200-20 MG/5ML suspension Take 30 mLs by mouth 2 (two) times daily.  Marland Kitchen aspirin EC 81 MG tablet Take 81 mg by mouth daily.  Marland Kitchen atenolol (TENORMIN) 100 MG tablet TAKE ONE TABLET BY MOUTH ONCE DAILY FOR BLOOD PRESSURE  . B Complex-C (SUPER B COMPLEX PO) Take 1 tablet by mouth daily.  . Cholecalciferol (VITAMIN D) 2000 UNITS CAPS Take 2,000 Units by mouth daily.  . Flaxseed, Linseed, (FLAX SEED OIL) 1000 MG CAPS Take 1,000 mg by mouth daily.  . fluticasone (FLONASE) 50 MCG/ACT  nasal spray USE TWO SPRAY(S) IN EACH NOSTRIL ONCE DAILY  . glucose blood (FREESTYLE TEST STRIPS) test strip Use as instructed, DX 250.00  . hydrochlorothiazide (HYDRODIURIL) 25 MG tablet TAKE 1 TABLET BY MOUTH ONCE DAILY  . Lancets (FREESTYLE) lancets   . loratadine (CLARITIN) 10 MG tablet Take 1 tablet daily for allergies  . Omega-3 Fatty Acids (FISH OIL) 1000 MG  CAPS Take 1,000 mg by mouth daily.  . pravastatin (PRAVACHOL) 40 MG tablet TAKE ONE TABLET BY MOUTH ONCE DAILY  . ranitidine (ZANTAC) 300 MG tablet TAKE ONE TABLET BY MOUTH TWICE DAILY (Patient taking differently: as needed)  . sildenafil (REVATIO) 20 MG tablet Take 1-5 tablets daily PRN XXXX.  Marland Kitchen sildenafil (VIAGRA) 100 MG tablet Take 1 tablet (100 mg total) by mouth as needed for erectile dysfunction.  . tamsulosin (FLOMAX) 0.4 MG CAPS capsule Take 1 capsule (0.4 mg total) by mouth at bedtime.  . triamcinolone cream (KENALOG) 0.1 % Apply to rash 3 to 4 x daily if needed   No current facility-administered medications on file prior to visit.      Allergies:  Allergies  Allergen Reactions  . Ace Inhibitors     REACTION: cough  . Trilipix [Choline Fenofibrate] Other (See Comments)    Heart burn     Medical History:  Past Medical History:  Diagnosis Date  . GERD (gastroesophageal reflux disease)   . Gout   . HSV-1 (herpes simplex virus 1) infection   . HSV-2 (herpes simplex virus 2) infection   . Hyperlipidemia   . Hypertension   . Hypogonadism male   . Obese   . Pneumonia    walking -many years ago  . Pre-diabetes    Family history- Reviewed and unchanged Social history- Reviewed and unchanged   Review of Systems:  ROS    Physical Exam: BP 120/82   Pulse 61   Temp (!) 97 F (36.1 C)   Ht 5\' 8"  (1.727 m)   Wt 198 lb (89.8 kg)   SpO2 97%   BMI 30.11 kg/m  Wt Readings from Last 3 Encounters:  01/28/17 198 lb (89.8 kg)  12/30/16 196 lb 9.6 oz (89.2 kg)  09/24/16 197 lb (89.4 kg)   General Appearance: Well nourished, in no apparent distress. Eyes: PERRLA, EOMs, conjunctiva no swelling or erythema Sinuses: No Frontal/maxillary tenderness ENT/Mouth: Ext aud canals clear, TMs without erythema, bulging. No erythema, swelling, or exudate on post pharynx.  Tonsils not swollen or erythematous. Hearing normal.  Neck: Supple, thyroid normal.  Respiratory: Respiratory  effort normal, BS equal bilaterally without rales, rhonchi, wheezing or stridor.  Cardio: RRR with no MRGs. Brisk peripheral pulses without edema.  Abdomen: Soft, + BS.  Non tender, no guarding, rebound, hernias, masses. Lymphatics: Non tender without lymphadenopathy.  Musculoskeletal: Full ROM, 5/5 strength, Normal gait Skin: Warm, dry without rashes, lesions, ecchymosis.  Neuro: Cranial nerves intact. No cerebellar symptoms.  Psych: Awake and oriented X 3, normal affect, Insight and Judgment appropriate.    Izora Ribas, NP 4:44 PM [facility]

## 2017-01-28 ENCOUNTER — Encounter: Payer: Self-pay | Admitting: Adult Health

## 2017-01-28 ENCOUNTER — Ambulatory Visit: Payer: BLUE CROSS/BLUE SHIELD | Admitting: Adult Health

## 2017-01-28 VITALS — BP 120/82 | HR 61 | Temp 97.0°F | Ht 68.0 in | Wt 198.0 lb

## 2017-01-28 DIAGNOSIS — M1A00X0 Idiopathic chronic gout, unspecified site, without tophus (tophi): Secondary | ICD-10-CM

## 2017-01-28 DIAGNOSIS — E663 Overweight: Secondary | ICD-10-CM | POA: Diagnosis not present

## 2017-01-28 DIAGNOSIS — E559 Vitamin D deficiency, unspecified: Secondary | ICD-10-CM | POA: Diagnosis not present

## 2017-01-28 DIAGNOSIS — Z79899 Other long term (current) drug therapy: Secondary | ICD-10-CM

## 2017-01-28 DIAGNOSIS — R7303 Prediabetes: Secondary | ICD-10-CM

## 2017-01-28 DIAGNOSIS — E782 Mixed hyperlipidemia: Secondary | ICD-10-CM | POA: Diagnosis not present

## 2017-01-28 DIAGNOSIS — K219 Gastro-esophageal reflux disease without esophagitis: Secondary | ICD-10-CM

## 2017-01-28 DIAGNOSIS — I10 Essential (primary) hypertension: Secondary | ICD-10-CM | POA: Diagnosis not present

## 2017-01-28 MED ORDER — ALLOPURINOL 300 MG PO TABS: ORAL_TABLET | ORAL | 1 refills | Status: DC

## 2017-01-28 MED ORDER — ATENOLOL 100 MG PO TABS: ORAL_TABLET | ORAL | 1 refills | Status: DC

## 2017-01-28 MED ORDER — RANITIDINE HCL 300 MG PO TABS: 300.0000 mg | ORAL_TABLET | ORAL | 1 refills | Status: DC | PRN

## 2017-01-28 NOTE — Patient Instructions (Signed)
Work towards increasing your vegetable intake - try to get in 2-3 servings with lunch and dinner - 5+ servings a day -   1 serving is 1/2 cup of cooked vegetable or 1 cup of raw leafy vegetable   Here is some information to help you keep your heart healthy: Move it! - Aim for 30 mins of activity every day. Take it slowly at first. Talk to [place] before starting any new exercise program.   Lose it.  -Body Mass Index (BMI) can indicate if you need to lose weight. A healthy range is 18.5-24.9. For a BMI calculator, go to [employer]  Waist Management -Excess abdominal fat is a risk factor for heart disease, diabetes, asthma, stroke and more. Ideal waist circumference is less than 35" for women and less than 40" for men.   Eat Right -focus on fruits, vegetables, whole grains, and meals you make yourself. Avoid foods with trans fat and high sugar/sodium content.   Snooze or Snore? - Loud snoring can be a sign of sleep apnea, a significant risk factor for high blood pressure, heart attach, stroke, and heart arrhythmias.  Kick the habit -Quit Smoking! Avoid second hand smoke. A single cigarette raises your blood pressure for 20 mins and increases the risk of heart attack and stroke for the next 24 hours.   Are Aspirin and Supplements right for you? -Add ENTERIC COATED low dose 81 mg Aspirin daily OR can do every other day if you have easy bruising to protect your heart and head. As well as to reduce risk of Colon Cancer by 20 %, Skin Cancer by 26 % , Melanoma by 46% and Pancreatic cancer by 60%  Say "No to Stress -There may be little you can do about problems that cause stress. However, techniques such as long walks, meditation, and exercise can help you manage it.   Start Now! - Make changes one at a time and set reasonable goals to increase your likelihood of success.

## 2017-01-29 LAB — TSH: TSH: 1.08 m[IU]/L (ref 0.40–4.50)

## 2017-01-29 LAB — BASIC METABOLIC PANEL WITHOUT GFR
BUN: 10 mg/dL (ref 7–25)
CO2: 30 mmol/L (ref 20–32)
Calcium: 9.7 mg/dL (ref 8.6–10.3)
Chloride: 101 mmol/L (ref 98–110)
Creat: 0.87 mg/dL (ref 0.70–1.25)
GFR, Est African American: 109 mL/min/1.73m2
GFR, Est Non African American: 94 mL/min/1.73m2
Glucose, Bld: 111 mg/dL — ABNORMAL HIGH (ref 65–99)
Potassium: 3.5 mmol/L (ref 3.5–5.3)
Sodium: 138 mmol/L (ref 135–146)

## 2017-01-29 LAB — HEPATIC FUNCTION PANEL
AG RATIO: 1.8 (calc) (ref 1.0–2.5)
ALBUMIN MSPROF: 4.3 g/dL (ref 3.6–5.1)
ALT: 11 U/L (ref 9–46)
AST: 21 U/L (ref 10–35)
Alkaline phosphatase (APISO): 79 U/L (ref 40–115)
Bilirubin, Direct: 0.1 mg/dL (ref 0.0–0.2)
GLOBULIN: 2.4 g/dL (ref 1.9–3.7)
Indirect Bilirubin: 0.5 mg/dL (calc) (ref 0.2–1.2)
TOTAL PROTEIN: 6.7 g/dL (ref 6.1–8.1)
Total Bilirubin: 0.6 mg/dL (ref 0.2–1.2)

## 2017-01-29 LAB — CBC WITH DIFFERENTIAL/PLATELET
Basophils Absolute: 39 {cells}/uL (ref 0–200)
Basophils Relative: 0.8 %
Eosinophils Absolute: 377 {cells}/uL (ref 15–500)
Eosinophils Relative: 7.7 %
HCT: 43.2 % (ref 38.5–50.0)
Hemoglobin: 14.8 g/dL (ref 13.2–17.1)
Lymphs Abs: 2234 {cells}/uL (ref 850–3900)
MCH: 30.6 pg (ref 27.0–33.0)
MCHC: 34.3 g/dL (ref 32.0–36.0)
MCV: 89.4 fL (ref 80.0–100.0)
MPV: 11.8 fL (ref 7.5–12.5)
Monocytes Relative: 11.6 %
Neutro Abs: 1681 {cells}/uL (ref 1500–7800)
Neutrophils Relative %: 34.3 %
Platelets: 148 Thousand/uL (ref 140–400)
RBC: 4.83 Million/uL (ref 4.20–5.80)
RDW: 13 % (ref 11.0–15.0)
Total Lymphocyte: 45.6 %
WBC mixed population: 568 {cells}/uL (ref 200–950)
WBC: 4.9 Thousand/uL (ref 3.8–10.8)

## 2017-01-29 LAB — LIPID PANEL
CHOL/HDL RATIO: 3.9 (calc) (ref <5.0)
CHOLESTEROL: 150 mg/dL (ref <200)
HDL: 38 mg/dL — AB (ref >40)
LDL CHOLESTEROL (CALC): 84 mg/dL
NON-HDL CHOLESTEROL (CALC): 112 mg/dL (ref <130)
TRIGLYCERIDES: 186 mg/dL — AB (ref <150)

## 2017-01-29 LAB — VITAMIN D 25 HYDROXY (VIT D DEFICIENCY, FRACTURES): Vit D, 25-Hydroxy: 92 ng/mL (ref 30–100)

## 2017-01-29 LAB — HEMOGLOBIN A1C
Hgb A1c MFr Bld: 5.6 % of total Hgb (ref <5.7)
MEAN PLASMA GLUCOSE: 114 (calc)
eAG (mmol/L): 6.3 (calc)

## 2017-03-08 ENCOUNTER — Telehealth: Payer: Self-pay | Admitting: *Deleted

## 2017-03-08 ENCOUNTER — Other Ambulatory Visit: Payer: Self-pay | Admitting: Internal Medicine

## 2017-03-08 DIAGNOSIS — N529 Male erectile dysfunction, unspecified: Secondary | ICD-10-CM

## 2017-03-08 MED ORDER — SILDENAFIL CITRATE 100 MG PO TABS: ORAL_TABLET | ORAL | 3 refills | Status: DC

## 2017-03-08 NOTE — Telephone Encounter (Signed)
RX for Sildenafil 100 mg tablets faxed to the [facility] at the patient's request.(fax [phone].

## 2017-03-24 NOTE — Progress Notes (Signed)
Complete Physical  Assessment and Plan:  Essential hypertension - continue medications, DASH diet, exercise and monitor at home. Call if greater than 130/80.  -     CBC with Differential/Platelet -     BASIC METABOLIC PANEL WITH GFR -     Hepatic function panel -     TSH -     Urinalysis, Routine w reflex microscopic -     Microalbumin / creatinine urine ratio -     EKG 12-Lead  Testosterone deficiency  check testosterone levels as needed.   Prediabetes -     Hemoglobin A1c  Hyperlipidemia -continue medications, check lipids, decrease fatty foods, increase activity.  -     Lipid panel  Vitamin D deficiency -     VITAMIN D 25 Hydroxy (Vit-D Deficiency, Fractures)  Medication management -     Magnesium  Overweight (BMI 25.0-29.9)  Overweight  - long discussion about weight loss, diet, and exercise -recommended diet heavy in fruits and veggies and low in animal meats, cheeses, and dairy products  Gastroesophageal reflux disease, esophagitis presence not specified Continue PPI/H2 blocker, diet discussed  Idiopathic chronic gout without tophus, unspecified site Gout- recheck Uric acid as needed, Diet discussed, continue medications.  Encounter for general adult medical examination with abnormal findings 1 year  Screening for prostate cancer -     PSA  Rash -     triamcinolone ointment (KENALOG) 0.1 %; Apply 1 application topically 2 (two) times daily. - if not better contact the office.    Discussed med's effects and SE's. Screening labs and tests as requested with regular follow-up as recommended.  HPI Patient presents for a complete physical.   He has rash in between his left fingers on 3rd lateral digit, in between 4th and 5th, and on ulnar side of his palm, he states he has it every winter, it itches, nurse at work gave him something.   His blood pressure has been controlled at home, today their BP is BP: 132/78 He does workout. He denies chest pain,  shortness of breath, dizziness.   He is on cholesterol medication and denies myalgias. His cholesterol is at goal. The cholesterol last visit was:   Lab Results  Component Value Date   CHOL 150 01/28/2017   HDL 38 (L) 01/28/2017   LDLCALC 77 09/24/2016   TRIG 186 (H) 01/28/2017   CHOLHDL 3.9 01/28/2017   He has been working on diet and exercise for prediabetes, he is on bASA, he is on ACE/ARB and denies foot ulcerations, hyperglycemia, hypoglycemia , increased appetite, nausea, paresthesia of the feet, polydipsia, polyuria, visual disturbances, vomiting and weight loss. Last A1C in the office was:  Lab Results  Component Value Date   HGBA1C 5.6 01/28/2017   Patient is on Vitamin D supplement.   Lab Results  Component Value Date   VD25OH 92 01/28/2017   Last PSA was: Lab Results  Component Value Date   PSA 1.6 03/26/2016   BMI is Body mass index is 29.43 kg/m., he is working on diet and exercise. Wt Readings from Last 3 Encounters:  03/29/17 196 lb 6.4 oz (89.1 kg)  01/28/17 198 lb (89.8 kg)  12/30/16 196 lb 9.6 oz (89.2 kg)   Patient is on allopurinol for gout and does not report a recent flare.  Lab Results  Component Value Date   LABURIC 4.1 09/24/2016    Current Medications:  Current Outpatient Medications on File Prior to Visit  Medication Sig  . acyclovir (  ZOVIRAX) 800 MG tablet Take 1 tablet (800 mg total) daily by mouth.  Marland Kitchen allopurinol (ZYLOPRIM) 300 MG tablet TAKE 1/2 TABLET BY MOUTH ONCE DAILY FOR  GOUT  . alum & mag hydroxide-simeth (MAALOX/MYLANTA) 200-200-20 MG/5ML suspension Take 30 mLs by mouth 2 (two) times daily.  Marland Kitchen aspirin EC 81 MG tablet Take 81 mg by mouth daily.  Marland Kitchen atenolol (TENORMIN) 100 MG tablet TAKE 1/2 TABLET BY MOUTH ONCE DAILY FOR BLOOD PRESSURE  . B Complex-C (SUPER B COMPLEX PO) Take 1 tablet by mouth daily.  . Cholecalciferol (VITAMIN D) 2000 UNITS CAPS Take 2,000 Units by mouth daily.  . Flaxseed, Linseed, (FLAX SEED OIL) 1000 MG CAPS  Take 1,000 mg by mouth daily.  . fluticasone (FLONASE) 50 MCG/ACT nasal spray USE TWO SPRAY(S) IN EACH NOSTRIL ONCE DAILY  . glucose blood (FREESTYLE TEST STRIPS) test strip Use as instructed, DX 250.00  . hydrochlorothiazide (HYDRODIURIL) 25 MG tablet TAKE 1 TABLET BY MOUTH ONCE DAILY  . Lancets (FREESTYLE) lancets   . loratadine (CLARITIN) 10 MG tablet Take 1 tablet daily for allergies  . Omega-3 Fatty Acids (FISH OIL) 1000 MG CAPS Take 1,000 mg by mouth daily.  . pravastatin (PRAVACHOL) 40 MG tablet TAKE ONE TABLET BY MOUTH ONCE DAILY  . ranitidine (ZANTAC) 300 MG tablet Take 1 tablet (300 mg total) by mouth as needed.  . sildenafil (REVATIO) 20 MG tablet Take 1-5 tablets daily PRN XXXX.  Marland Kitchen sildenafil (VIAGRA) 100 MG tablet Take 1/2 to 1 tablet daily as needed for XXXX  . tamsulosin (FLOMAX) 0.4 MG CAPS capsule Take 1 capsule (0.4 mg total) by mouth at bedtime.  . triamcinolone cream (KENALOG) 0.1 % Apply to rash 3 to 4 x daily if needed   No current facility-administered medications on file prior to visit.     Health Maintenance:  Immunization History  Administered Date(s) Administered  . PPD Test 02/13/2014  . Pneumococcal Polysaccharide-23 02/23/2002  . Tdap 05/15/2009   Tetanus: 2011 Pneumovax: 2004 Influenza:  Done at work 2018 Colonoscopy: 2018, follow up 3 years  Patient Care Team: Unk Pinto, MD as PCP - General (Internal Medicine)  Medical History:  Past Medical History:  Diagnosis Date  . GERD (gastroesophageal reflux disease)   . Gout   . HSV-1 (herpes simplex virus 1) infection   . HSV-2 (herpes simplex virus 2) infection   . Hyperlipidemia   . Hypertension   . Hypogonadism male   . Obese   . Pneumonia    walking -many years ago  . Pre-diabetes    Allergies Allergies  Allergen Reactions  . Ace Inhibitors     REACTION: cough  . Trilipix [Choline Fenofibrate] Other (See Comments)    Heart burn    SURGICAL HISTORY He  has a past surgical  history that includes Back surgery (2012); Laparoscopic inguinal hernia with umbilical hernia (Right, 08/15/2013); Insertion of mesh (N/A, 08/15/2013); and Hernia repair. FAMILY HISTORY His family history includes Cancer (age of onset: 57) in his brother; Diabetes in his mother; Hypertension in his brother, brother, brother, and mother. SOCIAL HISTORY He  reports that he quit smoking about 15 years ago. he has never used smokeless tobacco. He reports that he does not drink alcohol or use drugs.  Review of Systems:  Review of Systems  Constitutional: Negative for chills, fever and malaise/fatigue.  HENT: Negative for congestion, ear pain and sore throat.   Eyes: Negative.   Respiratory: Negative for cough, shortness of breath and wheezing.  Cardiovascular: Negative for chest pain, palpitations and leg swelling.  Gastrointestinal: Negative for abdominal pain, blood in stool, constipation, diarrhea, heartburn and melena.  Genitourinary: Negative.   Skin: Negative.   Neurological: Negative for dizziness, sensory change, loss of consciousness and headaches.  Psychiatric/Behavioral: Negative for depression. The patient is not nervous/anxious and does not have insomnia.     Physical Exam: Estimated body mass index is 29.43 kg/m as calculated from the following:   Height as of this encounter: 5' 8.5" (1.74 m).   Weight as of this encounter: 196 lb 6.4 oz (89.1 kg). BP 132/78   Resp 16   Ht 5' 8.5" (1.74 m)   Wt 196 lb 6.4 oz (89.1 kg)   BMI 29.43 kg/m   General Appearance: Well nourished, in no apparent distress.  Eyes: PERRLA, EOMs, conjunctiva no swelling or erythema ENT/Mouth: Ear canals clear bilaterally with no erythema, swelling, discharge.  TMs normal bilaterally with no erythema, bulging, or retractions.  Oropharynx clear and moist with no exudate, swelling, or erythema.  Dentition normal.   Neck: Supple, thyroid normal. No bruits, JVD, cervical adenopathy Respiratory: Respiratory  effort normal, BS equal bilaterally without rales, rhonchi, wheezing or stridor.  Cardio: RRR without murmurs, rubs or gallops. Brisk peripheral pulses without edema.  Chest: symmetric, with normal excursions Abdomen: Soft, nontender, no guarding, rebound, hernias, masses, or organomegaly. Musculoskeletal: Full ROM all peripheral extremities,5/5 strength, and normal gait.  Skin: left hand with dry scaly thick skin  between his left fingers on 3rd lateral digit, in between 4th and 5th, and on ulnar side of his palm, Warm, dry without rashes, lesions, ecchymosis. Neuro: A&Ox3, Cranial nerves intact, reflexes equal bilaterally. Normal muscle tone, no cerebellar symptoms. Sensation intact.  Psych: Normal affect, Insight and Judgment appropriate.   EKG:  sinus brady, PRWP, IRBBB, Normal stress test 3 years ago.   Over 40 minutes of exam, counseling, chart review and critical decision making was performed  Vicie Mutters 2:13 PM [facility]

## 2017-03-25 ENCOUNTER — Encounter: Payer: Self-pay | Admitting: Internal Medicine

## 2017-03-29 ENCOUNTER — Encounter: Payer: Self-pay | Admitting: Physician Assistant

## 2017-03-29 ENCOUNTER — Encounter: Payer: Self-pay | Admitting: Internal Medicine

## 2017-03-29 ENCOUNTER — Ambulatory Visit (INDEPENDENT_AMBULATORY_CARE_PROVIDER_SITE_OTHER): Payer: BLUE CROSS/BLUE SHIELD | Admitting: Physician Assistant

## 2017-03-29 VITALS — BP 132/78 | Resp 16 | Ht 68.5 in | Wt 196.4 lb

## 2017-03-29 DIAGNOSIS — E559 Vitamin D deficiency, unspecified: Secondary | ICD-10-CM

## 2017-03-29 DIAGNOSIS — K219 Gastro-esophageal reflux disease without esophagitis: Secondary | ICD-10-CM

## 2017-03-29 DIAGNOSIS — Z125 Encounter for screening for malignant neoplasm of prostate: Secondary | ICD-10-CM | POA: Diagnosis not present

## 2017-03-29 DIAGNOSIS — E782 Mixed hyperlipidemia: Secondary | ICD-10-CM

## 2017-03-29 DIAGNOSIS — R7303 Prediabetes: Secondary | ICD-10-CM

## 2017-03-29 DIAGNOSIS — M1A00X0 Idiopathic chronic gout, unspecified site, without tophus (tophi): Secondary | ICD-10-CM

## 2017-03-29 DIAGNOSIS — I10 Essential (primary) hypertension: Secondary | ICD-10-CM | POA: Diagnosis not present

## 2017-03-29 DIAGNOSIS — Z79899 Other long term (current) drug therapy: Secondary | ICD-10-CM

## 2017-03-29 DIAGNOSIS — E663 Overweight: Secondary | ICD-10-CM

## 2017-03-29 DIAGNOSIS — Z0001 Encounter for general adult medical examination with abnormal findings: Secondary | ICD-10-CM

## 2017-03-29 DIAGNOSIS — Z0000 Encounter for general adult medical examination without abnormal findings: Secondary | ICD-10-CM | POA: Diagnosis not present

## 2017-03-29 DIAGNOSIS — Z136 Encounter for screening for cardiovascular disorders: Secondary | ICD-10-CM | POA: Diagnosis not present

## 2017-03-29 DIAGNOSIS — E349 Endocrine disorder, unspecified: Secondary | ICD-10-CM

## 2017-03-29 DIAGNOSIS — R21 Rash and other nonspecific skin eruption: Secondary | ICD-10-CM

## 2017-03-29 DIAGNOSIS — Z6829 Body mass index (BMI) 29.0-29.9, adult: Secondary | ICD-10-CM

## 2017-03-29 MED ORDER — TRIAMCINOLONE ACETONIDE 0.1 % EX OINT: 1.0000 "application " | TOPICAL_OINTMENT | Freq: Two times a day (BID) | CUTANEOUS | 1 refills | Status: DC

## 2017-03-29 NOTE — Patient Instructions (Signed)
Hand Dermatitis Hand dermatitis is a skin condition that causes small, itchy, scaly issues. This condition may also be called hand eczema. What are the causes? The cause of this condition is not known. What increases the risk? This condition is more likely to develop in people who have a history of allergies, such as:  Hay fever.  Allergic asthma.  An allergy to latex.  Chemical exposure, injuries, and environmental irritants can make hand dermatitis worse. Washing your hands too often can remove natural oils, which can dry out the skin and contribute to outbreaks of this condition. What are the signs or symptoms? The most common symptom of this condition is intense itchiness. Cracks or grooves (fissures) on the fingers can also develop. Affected areas can be painful, especially areas where large blisters have formed. How is this diagnosed? This condition is diagnosed with a medical history and physical exam. How is this treated? This condition is treated with medicines, including:  Steroid creams and ointments.  Oral steroid medicines.  Antibiotic medicines. These are prescribed if you have an infection.  Antihistamine medicines. These help to reduce itchiness.  Follow these instructions at home:  Take or apply over-the-counter and prescription medicines only as told by your health care provider.  If you were prescribed an antibiotic medicine, use it as told by your health care provider. Do not stop using the antibiotic even if you start to feel better.  Avoid washing your hands more often than necessary.  Avoid using harsh chemicals on your hands.  Wear protective gloves when you handle products that can irritate your skin.  Keep all follow-up visits as told by your health care provider. This is important. Contact a health care provider if:  Your rash does not improve during the first week of treatment.  Your rash is red or tender.  Your rash has pus coming from  it.  Your rash spreads. This information is not intended to replace advice given to you by your health care provider. Make sure you discuss any questions you have with your health care provider. Document Released: 02/09/2005 Document Revised: 07/18/2015 Document Reviewed: 08/24/2014 Elsevier Interactive Patient Education  2018 [employer].   Cholesterol Cholesterol is a white, waxy, fat-like substance that is needed by the human body in small amounts. The liver makes all the cholesterol we need. Cholesterol is carried from the liver by the blood through the blood vessels. Deposits of cholesterol (plaques) may build up on blood vessel (artery) walls. Plaques make the arteries narrower and stiffer. Cholesterol plaques increase the risk for heart attack and stroke. You cannot feel your cholesterol level even if it is very high. The only way to know that it is high is to have a blood test. Once you know your cholesterol levels, you should keep a record of the test results. Work with your health care provider to keep your levels in the desired range. What do the results mean?  Total cholesterol is a rough measure of all the cholesterol in your blood.  LDL (low-density lipoprotein) is the "bad" cholesterol. This is the type that causes plaque to build up on the artery walls. You want this level to be low.  HDL (high-density lipoprotein) is the "good" cholesterol because it cleans the arteries and carries the LDL away. You want this level to be high.  Triglycerides are fat that the body can either burn for energy or store. High levels are closely linked to heart disease. What are the desired levels of  cholesterol?  Total cholesterol below 200.  LDL below 100 for people who are at risk, below 70 for people at very high risk.  HDL above 40 is good. A level of 60 or higher is considered to be protective against heart disease.  Triglycerides below 150. How can I lower my  cholesterol? Diet Follow your diet program as told by your health care provider.  Choose fish or white meat chicken and [place], roasted or baked. Limit fatty cuts of red meat, fried foods, and processed meats, such as sausage and lunch meats.  Eat lots of fresh fruits and vegetables.  Choose whole grains, beans, pasta, potatoes, and cereals.  Choose olive oil, corn oil, or canola oil, and use only small amounts.  Avoid butter, mayonnaise, shortening, or palm kernel oils.  Avoid foods with trans fats.  Drink skim or nonfat milk and eat low-fat or nonfat yogurt and cheeses. Avoid whole milk, cream, ice cream, egg yolks, and full-fat cheeses.  Healthier desserts include angel food cake, ginger snaps, animal crackers, hard candy, popsicles, and low-fat or nonfat frozen yogurt. Avoid pastries, cakes, pies, and cookies.  Exercise  Follow your exercise program as told by your health care provider. A regular program: ? Helps to decrease LDL and raise HDL. ? Helps with weight control.  Do things that increase your activity level, such as gardening, walking, and taking the stairs.  Ask your health care provider about ways that you can be more active in your daily life.  Medicine  Take over-the-counter and prescription medicines only as told by your health care provider. ? Medicine may be prescribed by your health care provider to help lower cholesterol and decrease the risk for heart disease. This is usually done if diet and exercise have failed to bring down cholesterol levels. ? If you have several risk factors, you may need medicine even if your levels are normal.  This information is not intended to replace advice given to you by your health care provider. Make sure you discuss any questions you have with your health care provider. Document Released: 11/04/2000 Document Revised: 09/07/2015 Document Reviewed: 08/10/2015 Elsevier Interactive Patient Education  [employer].

## 2017-03-30 LAB — URINALYSIS, ROUTINE W REFLEX MICROSCOPIC
Bilirubin Urine: NEGATIVE
Glucose, UA: NEGATIVE
HGB URINE DIPSTICK: NEGATIVE
KETONES UR: NEGATIVE
Leukocytes, UA: NEGATIVE
NITRITE: NEGATIVE
PROTEIN: NEGATIVE
Specific Gravity, Urine: 1.008 (ref 1.001–1.03)
pH: 7.5 (ref 5.0–8.0)

## 2017-03-30 LAB — BASIC METABOLIC PANEL WITH GFR
BUN: 15 mg/dL (ref 7–25)
CO2: 29 mmol/L (ref 20–32)
Calcium: 9.8 mg/dL (ref 8.6–10.3)
Chloride: 102 mmol/L (ref 98–110)
Creat: 0.98 mg/dL (ref 0.70–1.25)
GFR, Est African American: 96 mL/min/{1.73_m2} (ref >=60)
GFR, Est Non African American: 83 mL/min/{1.73_m2} (ref >=60)
GLUCOSE: 93 mg/dL (ref 65–99)
Potassium: 3.7 mmol/L (ref 3.5–5.3)
SODIUM: 139 mmol/L (ref 135–146)

## 2017-03-30 LAB — CBC WITH DIFFERENTIAL/PLATELET
BASOS PCT: 0.5 %
Basophils Absolute: 28 cells/uL (ref 0–200)
EOS PCT: 5.1 %
Eosinophils Absolute: 281 cells/uL (ref 15–500)
HCT: 45.5 % (ref 38.5–50.0)
Hemoglobin: 15.5 g/dL (ref 13.2–17.1)
Lymphs Abs: 2371 cells/uL (ref 850–3900)
MCH: 30.4 pg (ref 27.0–33.0)
MCHC: 34.1 g/dL (ref 32.0–36.0)
MCV: 89.2 fL (ref 80.0–100.0)
MONOS PCT: 10.4 %
MPV: 11.9 fL (ref 7.5–12.5)
Neutro Abs: 2250 cells/uL (ref 1500–7800)
Neutrophils Relative %: 40.9 %
Platelets: 150 10*3/uL (ref 140–400)
RBC: 5.1 10*6/uL (ref 4.20–5.80)
RDW: 13 % (ref 11.0–15.0)
Total Lymphocyte: 43.1 %
WBC mixed population: 572 cells/uL (ref 200–950)
WBC: 5.5 10*3/uL (ref 3.8–10.8)

## 2017-03-30 LAB — LIPID PANEL
CHOL/HDL RATIO: 3.9 (calc) (ref <5.0)
CHOLESTEROL: 166 mg/dL (ref <200)
HDL: 43 mg/dL (ref >40)
LDL Cholesterol (Calc): 98 mg/dL (calc)
Non-HDL Cholesterol (Calc): 123 mg/dL (calc) (ref <130)
Triglycerides: 148 mg/dL (ref <150)

## 2017-03-30 LAB — HEMOGLOBIN A1C
EAG (MMOL/L): 6.3 (calc)
Hgb A1c MFr Bld: 5.6 % of total Hgb (ref <5.7)
Mean Plasma Glucose: 114 (calc)

## 2017-03-30 LAB — HEPATIC FUNCTION PANEL
AG Ratio: 2 (calc) (ref 1.0–2.5)
ALKALINE PHOSPHATASE (APISO): 59 U/L (ref 40–115)
ALT: 11 U/L (ref 9–46)
AST: 21 U/L (ref 10–35)
Albumin: 4.4 g/dL (ref 3.6–5.1)
Bilirubin, Direct: 0.1 mg/dL (ref 0.0–0.2)
Globulin: 2.2 g/dL (calc) (ref 1.9–3.7)
Indirect Bilirubin: 0.7 mg/dL (calc) (ref 0.2–1.2)
TOTAL PROTEIN: 6.6 g/dL (ref 6.1–8.1)
Total Bilirubin: 0.8 mg/dL (ref 0.2–1.2)

## 2017-03-30 LAB — TSH: TSH: 1.29 mIU/L (ref 0.40–4.50)

## 2017-03-30 LAB — PSA: PSA: 2.2 ng/mL (ref <=4.0)

## 2017-03-30 LAB — MAGNESIUM: MAGNESIUM: 1.7 mg/dL (ref 1.5–2.5)

## 2017-03-30 LAB — MICROALBUMIN / CREATININE URINE RATIO: CREATININE, URINE: 48 mg/dL (ref 20–320)

## 2017-03-30 LAB — VITAMIN D 25 HYDROXY (VIT D DEFICIENCY, FRACTURES): VIT D 25 HYDROXY: 70 ng/mL (ref 30–100)

## 2017-04-06 ENCOUNTER — Other Ambulatory Visit: Payer: Self-pay | Admitting: Internal Medicine

## 2017-06-06 ENCOUNTER — Other Ambulatory Visit: Payer: Self-pay | Admitting: Internal Medicine

## 2017-06-06 DIAGNOSIS — J302 Other seasonal allergic rhinitis: Secondary | ICD-10-CM

## 2017-06-06 DIAGNOSIS — I10 Essential (primary) hypertension: Secondary | ICD-10-CM

## 2017-07-08 ENCOUNTER — Ambulatory Visit: Payer: BLUE CROSS/BLUE SHIELD | Admitting: Adult Health

## 2017-07-08 ENCOUNTER — Encounter: Payer: Self-pay | Admitting: Adult Health

## 2017-07-08 VITALS — BP 132/74 | HR 73 | Temp 97.5°F | Resp 18 | Ht 68.5 in | Wt 199.6 lb

## 2017-07-08 DIAGNOSIS — M79604 Pain in right leg: Secondary | ICD-10-CM | POA: Diagnosis not present

## 2017-07-08 DIAGNOSIS — M5410 Radiculopathy, site unspecified: Secondary | ICD-10-CM

## 2017-07-08 MED ORDER — PREDNISONE 20 MG PO TABS: ORAL_TABLET | ORAL | 0 refills | Status: AC

## 2017-07-08 NOTE — Progress Notes (Signed)
Assessment and Plan:  Caleb Johns was seen today for acute visit.  Diagnoses and all orders for this visit:  Acute pain of right lower extremity Unclear etiology; variable pain through hip and posterior thigh; cannot exclude atypical presentation of spinal stenosis Will prescribe a steroid taper today and have him call back in 10 days after course to report on progress. If pain continues will order further imaging at that time -     predniSONE (DELTASONE) 20 MG tablet; 3 tablets daily with food for 3 days, 2 tabs daily for 3 days, 1 tab a day for 5 days.  Further disposition pending results of labs. Discussed med's effects and SE's.   Over 15 minutes of exam, counseling, chart review, and critical decision making was performed.   Future Appointments  Date Time Provider [place]  09/27/2017  4:30 PM Liane Comber, NP [facility] None  04/05/2018  2:00 PM Vicie Mutters, PA-C [facility] None    ------------------------------------------------------------------------------------------------------------------   HPI 62 y.o. AA male presents for eval of right posterior thigh pain x 2-3 weeks, intermittently. He reports onset was gradual, without known injury or event causing onset. He reports pain is worst with walking, but does not consistently have. Character is achy, comes in waves/episodes, ranging up to 8/10, denies radiation, location is variable ranging through right posterior thigh and less frequently through his hip. He reports pain typically resolves within 30 min. He denies any lumbar, hip/knee joint pain. Denies parasthesias, weakness of lower extremities, weakness of lower extremities, foot drop, or loss of bowel/bladder control. He has tried applying hit, old muscle relaxer prescription, aleve (2 tabs) with minimal benefit. He has tried applying bengay topical cream with some benefit.    Past Medical History:  Diagnosis Date  . GERD (gastroesophageal reflux disease)   . Gout    . HSV-1 (herpes simplex virus 1) infection   . HSV-2 (herpes simplex virus 2) infection   . Hyperlipidemia   . Hypertension   . Hypogonadism male   . Obese   . Pneumonia    walking -many years ago  . Pre-diabetes      Allergies  Allergen Reactions  . Ace Inhibitors     REACTION: cough  . Trilipix [Choline Fenofibrate] Other (See Comments)    Heart burn    Current Outpatient Medications on File Prior to Visit  Medication Sig  . acyclovir (ZOVIRAX) 800 MG tablet Take 1 tablet (800 mg total) daily by mouth.  Marland Kitchen allopurinol (ZYLOPRIM) 300 MG tablet TAKE 1/2 TABLET BY MOUTH ONCE DAILY FOR  GOUT  . alum & mag hydroxide-simeth (MAALOX/MYLANTA) 200-200-20 MG/5ML suspension Take 30 mLs by mouth 2 (two) times daily.  Marland Kitchen aspirin EC 81 MG tablet Take 81 mg by mouth daily.  Marland Kitchen atenolol (TENORMIN) 100 MG tablet TAKE ONE TABLET BY MOUTH ONCE DAILY FOR BLOOD PRESSURE  . B Complex-C (SUPER B COMPLEX PO) Take 1 tablet by mouth daily.  . Cholecalciferol (VITAMIN D) 2000 UNITS CAPS Take 2,000 Units by mouth daily.  . Flaxseed, Linseed, (FLAX SEED OIL) 1000 MG CAPS Take 1,000 mg by mouth daily.  . fluticasone (FLONASE) 50 MCG/ACT nasal spray USE TWO SPRAY(S) IN EACH NOSTRIL ONCE DAILY  . glucose blood (FREESTYLE TEST STRIPS) test strip Use as instructed, DX 250.00  . hydrochlorothiazide (HYDRODIURIL) 25 MG tablet TAKE 1 TABLET BY MOUTH ONCE DAILY  . Lancets (FREESTYLE) lancets   . loratadine (EQ ALLERGY RELIEF) 10 MG tablet TAKE 1 TABLET BY MOUTH DAILY FOR ALLERGIES  .  Omega-3 Fatty Acids (FISH OIL) 1000 MG CAPS Take 1,000 mg by mouth daily.  . pravastatin (PRAVACHOL) 40 MG tablet TAKE ONE TABLET BY MOUTH ONCE DAILY  . ranitidine (ZANTAC) 300 MG tablet Take 1 tablet (300 mg total) by mouth as needed.  . sildenafil (VIAGRA) 100 MG tablet Take 1/2 to 1 tablet daily as needed for XXXX  . tamsulosin (FLOMAX) 0.4 MG CAPS capsule Take 1 capsule (0.4 mg total) by mouth at bedtime.  . triamcinolone ointment  (KENALOG) 0.1 % Apply 1 application topically 2 (two) times daily.   No current facility-administered medications on file prior to visit.     ROS: all negative except above.   Physical Exam:  BP 132/74   Pulse 73   Temp (!) 97.5 F (36.4 C)   Resp 18   Ht 5' 8.5" (1.74 m)   Wt 199 lb 9.6 oz (90.5 kg)   SpO2 96%   BMI 29.91 kg/m   General Appearance: Well nourished, in no apparent distress. Neck: Supple.  Respiratory: Respiratory effort normal, BS equal bilaterally without rales, rhonchi, wheezing or stridor.  Cardio: RRR with no MRGs. Brisk peripheral pulses without edema.  Abdomen: Soft, + BS.  Non tender. Musculoskeletal: Full ROM, through lumbar spine, bilateral hips, knees, without palpable/obvious bony deformity, erythema, effusion. No point tenderness. 5/5 strength, normal gait.  Skin: Warm, dry without rashes, lesions, ecchymosis.  Neuro: Normal muscle tone, no cerebellar symptoms. Sensation intact.  Psych: Awake and oriented X 3, normal affect, Insight and Judgment appropriate.    Izora Ribas, NP 5:53 PM [facility]

## 2017-07-08 NOTE — Patient Instructions (Addendum)
   Spinal Stenosis Spinal stenosis happens when the open space (spinal canal) between the bones of your spine (vertebrae) gets smaller. It is caused by bone pushing into the open spaces of your backbone (spine). This puts pressure on your backbone and the nerves in your backbone. Treatment often focuses on managing any pain and symptoms. In some cases, surgery may be needed. Follow these instructions at home: Managing pain, stiffness, and swelling  Do all exercises and stretches as told by your doctor.  Stand and sit up straight (use good posture). If you were given a brace or a corset, wear it as told by your doctor.  Do not do any activities that cause pain. Ask your doctor what activities are safe for you.  Do not lift anything that is heavier than 10 lb (4.5 kg) or heavier than your doctor tells you.  Try to stay at a healthy weight. Talk with your doctor if you need help losing weight.  If directed, put heat on the affected area as often as told by your doctor. Use the heat source that your doctor recommends, such as a moist heat pack or a heating pad. ? Put a towel between your skin and the heat source. ? Leave the heat on for 20-30 minutes. ? Remove the heat if your skin turns bright red. This is especially important if you are not able to feel pain, heat, or cold. You may have a greater risk of getting burned. General instructions  Take over-the-counter and prescription medicines only as told by your doctor.  Do not use any products that contain nicotine or tobacco, such as cigarettes and e-cigarettes. If you need help quitting, ask your doctor.  Eat a healthy diet. This includes plenty of fruits and vegetables, whole grains, and low-fat (lean) protein.  Keep all follow-up visits as told by your doctor. This is important. Contact a doctor if:  Your symptoms do not get better.  Your symptoms get worse.  You have a fever. Get help right away if:  You have new or worse pain  in your neck or upper back.  You have very bad pain that medicine does not control.  You are dizzy.  You have vision problems, blurred vision, or double vision.  You have a very bad headache that is worse when you stand.  You feel sick to your stomach (nauseous).  You throw up (vomit).  You have new or worse numbness or tingling in your back or legs.  You have pain, redness, swelling, or warmth in your arm or leg. Summary  Spinal stenosis happens when the open space (spinal canal) between the bones of your spine gets smaller (narrow).  Contact a doctor if your symptoms get worse.  In some cases, surgery may be needed. This information is not intended to replace advice given to you by your health care provider. Make sure you discuss any questions you have with your health care provider. Document Released: 06/05/2010 Document Revised: 01/15/2016 Document Reviewed: 01/15/2016 Elsevier Interactive Patient Education  2017 [employer].

## 2017-07-18 ENCOUNTER — Other Ambulatory Visit: Payer: Self-pay | Admitting: Internal Medicine

## 2017-07-27 ENCOUNTER — Other Ambulatory Visit: Payer: Self-pay | Admitting: Internal Medicine

## 2017-07-28 NOTE — Addendum Note (Signed)
Addended by: Izora Ribas on: 07/28/2017 07:47 AM   Modules accepted: Orders

## 2017-08-06 ENCOUNTER — Encounter: Payer: Self-pay | Admitting: Neurology

## 2017-09-24 NOTE — Progress Notes (Signed)
FOLLOW UP  Assessment and Plan:   Hypertension Well controlled with current medications  Monitor blood pressure at home; patient to call if consistently greater than 130/80 Continue DASH diet.   Reminder to go to the ER if any CP, SOB, nausea, dizziness, severe HA, changes vision/speech, left arm numbness and tingling and jaw pain.  Cholesterol At goal; continue pravastatin  Continue low cholesterol diet and exercise.  Check lipid panel.   Prediabetes Recently well controlled with normalized A1Cs  Continue diet/exercise, weight management  Defer A1C to next visit; check CMP  Overweight Long discussion about weight loss, diet, and exercise Recommended diet heavy in fruits and veggies and low in animal meats, cheeses, and dairy products, appropriate calorie intake Discussed appropriate weight for height  Follow up at next visit  Vitamin D Def/ osteoporosis prevention At goal at recent check; continue to recommend supplementation for goal of 70-100 Defer vitamin D level  Gout Continue allopurinol Diet discussed Check uric acid as needed  Left LE radicular pain Insurance wouldn't cover MRI through primary care; pending eval by neuro; initial presentation somewhat suggestive of spinal stenosis Will retry steroid taper and muscle relaxer today   Continue diet and meds as discussed. Further disposition pending results of labs. Discussed med's effects and SE's.   Over 30 minutes of exam, counseling, chart review, and critical decision making was performed.   Future Appointments  Date Time Provider [place]  11/08/2017 10:00 AM Narda Amber K, DO [facility] None  04/05/2018  2:00 PM Vicie Mutters, PA-C [facility] None    ----------------------------------------------------------------------------------------------------------------------  HPI 62 y.o. very pleasant and friendly AA male  presents for 3 month follow up on hypertension, cholesterol, prediabetes, weight  and vitamin D deficiency. He continues to have left radicular pain, worse after walking on concrete, no lower back pain, but pending MRI.   BMI is Body mass index is 29.22 kg/m., he has been working on diet, doesn't intentionally exercise but reports he is active throughout the day - increasing fruit intake, greens from his garden - states he needs to keep working on vegetable intake. Wt Readings from Last 3 Encounters:  09/27/17 195 lb (88.5 kg)  07/08/17 199 lb 9.6 oz (90.5 kg)  03/29/17 196 lb 6.4 oz (89.1 kg)   His blood pressure has been controlled at home, today their BP is BP: 122/76  He does not workout. He denies chest pain, shortness of breath, dizziness.   He is on cholesterol medication (pravastatin 40 mg daily) and denies myalgias. His cholesterol is at goal. The cholesterol last visit was:   Lab Results  Component Value Date   CHOL 166 03/29/2017   HDL 43 03/29/2017   LDLCALC 98 03/29/2017   TRIG 148 03/29/2017   CHOLHDL 3.9 03/29/2017    He has been working on diet and exercise for prediabetes, and denies nausea, paresthesia of the feet, polydipsia, polyuria, visual disturbances, vomiting and weight loss. Last A1C in the office was:  Lab Results  Component Value Date   HGBA1C 5.6 03/29/2017   Patient is on Vitamin D supplement.   Lab Results  Component Value Date   VD25OH 71 03/29/2017     Patient is on allopurinol for gout and does not report a recent flare.  Lab Results  Component Value Date   LABURIC 4.1 09/24/2016     Current Medications:  Current Outpatient Medications on File Prior to Visit  Medication Sig  . alum & mag hydroxide-simeth (MAALOX/MYLANTA) 200-200-20 MG/5ML suspension Take  30 mLs by mouth 2 (two) times daily.  Marland Kitchen aspirin EC 81 MG tablet Take 81 mg by mouth daily.  Marland Kitchen atenolol (TENORMIN) 100 MG tablet TAKE ONE TABLET BY MOUTH ONCE DAILY FOR BLOOD PRESSURE  . B Complex-C (SUPER B COMPLEX PO) Take 1 tablet by mouth daily.  . Cholecalciferol  (VITAMIN D) 2000 UNITS CAPS Take 2,000 Units by mouth daily.  . Flaxseed, Linseed, (FLAX SEED OIL) 1000 MG CAPS Take 1,000 mg by mouth daily.  Marland Kitchen glucose blood (FREESTYLE TEST STRIPS) test strip Use as instructed, DX 250.00  . Lancets (FREESTYLE) lancets   . loratadine (EQ ALLERGY RELIEF) 10 MG tablet TAKE 1 TABLET BY MOUTH DAILY FOR ALLERGIES  . Omega-3 Fatty Acids (FISH OIL) 1000 MG CAPS Take 1,000 mg by mouth daily.  . pravastatin (PRAVACHOL) 40 MG tablet TAKE ONE TABLET BY MOUTH ONCE DAILY  . ranitidine (ZANTAC) 300 MG tablet Take 1 tablet (300 mg total) by mouth 2 (two) times daily as needed.  . sildenafil (VIAGRA) 100 MG tablet Take 1/2 to 1 tablet daily as needed for XXXX  . triamcinolone ointment (KENALOG) 0.1 % Apply 1 application topically 2 (two) times daily.  Marland Kitchen acyclovir (ZOVIRAX) 800 MG tablet Take 1 tablet (800 mg total) daily by mouth.  . fluticasone (FLONASE) 50 MCG/ACT nasal spray USE TWO SPRAY(S) IN EACH NOSTRIL ONCE DAILY (Patient not taking: Reported on 09/27/2017)   No current facility-administered medications on file prior to visit.      Allergies:  Allergies  Allergen Reactions  . Ace Inhibitors     REACTION: cough  . Trilipix [Choline Fenofibrate] Other (See Comments)    Heart burn     Medical History:  Past Medical History:  Diagnosis Date  . GERD (gastroesophageal reflux disease)   . Gout   . HSV-1 (herpes simplex virus 1) infection   . HSV-2 (herpes simplex virus 2) infection   . Hyperlipidemia   . Hypertension   . Hypogonadism male   . Obese   . Pneumonia    walking -many years ago  . Pre-diabetes    Family history- Reviewed and unchanged Social history- Reviewed and unchanged   Review of Systems:  Review of Systems  Constitutional: Negative for malaise/fatigue and weight loss.  HENT: Negative for hearing loss and tinnitus.   Eyes: Negative for blurred vision and double vision.  Respiratory: Negative for cough, shortness of breath and  wheezing.   Cardiovascular: Negative for chest pain, palpitations, orthopnea, claudication and leg swelling.  Gastrointestinal: Negative for abdominal pain, blood in stool, constipation, diarrhea, heartburn, melena, nausea and vomiting.  Genitourinary: Negative.   Musculoskeletal: Negative for joint pain and myalgias.       Burning radicular pain intermittently to left lateral/posterior lower extremity  Skin: Negative for rash.  Neurological: Negative for dizziness, tingling, sensory change, weakness and headaches.  Endo/Heme/Allergies: Negative for polydipsia.  Psychiatric/Behavioral: Negative.   All other systems reviewed and are negative.   Physical Exam: BP 122/76   Pulse 71   Temp (!) 97.2 F (36.2 C)   Ht 5' 8.5" (1.74 m)   Wt 195 lb (88.5 kg)   SpO2 97%   BMI 29.22 kg/m  Wt Readings from Last 3 Encounters:  09/27/17 195 lb (88.5 kg)  07/08/17 199 lb 9.6 oz (90.5 kg)  03/29/17 196 lb 6.4 oz (89.1 kg)   General Appearance: Well nourished, in no apparent distress. Eyes: PERRLA, EOMs, conjunctiva no swelling or erythema Sinuses: No Frontal/maxillary tenderness ENT/Mouth: Ext  aud canals clear, TMs without erythema, bulging. No erythema, swelling, or exudate on post pharynx.  Tonsils not swollen or erythematous. Hearing normal.  Neck: Supple, thyroid normal.  Respiratory: Respiratory effort normal, BS equal bilaterally without rales, rhonchi, wheezing or stridor.  Cardio: RRR with no MRGs. Brisk peripheral pulses without edema.  Abdomen: Soft, + BS.  Non tender, no guarding, rebound, hernias, masses. Lymphatics: Non tender without lymphadenopathy.  Musculoskeletal: Full ROM, 5/5 strength, Normal gait Skin: Warm, dry without rashes, lesions, ecchymosis.  Neuro: Cranial nerves intact. No cerebellar symptoms.  Psych: Awake and oriented X 3, normal affect, Insight and Judgment appropriate.    Izora Ribas, NP 4:50 PM [facility]

## 2017-09-27 ENCOUNTER — Encounter: Payer: Self-pay | Admitting: Adult Health

## 2017-09-27 ENCOUNTER — Ambulatory Visit: Payer: BLUE CROSS/BLUE SHIELD | Admitting: Adult Health

## 2017-09-27 VITALS — BP 122/76 | HR 71 | Temp 97.2°F | Ht 68.5 in | Wt 195.0 lb

## 2017-09-27 DIAGNOSIS — Z79899 Other long term (current) drug therapy: Secondary | ICD-10-CM | POA: Diagnosis not present

## 2017-09-27 DIAGNOSIS — E782 Mixed hyperlipidemia: Secondary | ICD-10-CM | POA: Diagnosis not present

## 2017-09-27 DIAGNOSIS — I10 Essential (primary) hypertension: Secondary | ICD-10-CM | POA: Diagnosis not present

## 2017-09-27 DIAGNOSIS — E663 Overweight: Secondary | ICD-10-CM | POA: Diagnosis not present

## 2017-09-27 DIAGNOSIS — M1A00X0 Idiopathic chronic gout, unspecified site, without tophus (tophi): Secondary | ICD-10-CM

## 2017-09-27 DIAGNOSIS — R7303 Prediabetes: Secondary | ICD-10-CM | POA: Diagnosis not present

## 2017-09-27 DIAGNOSIS — E559 Vitamin D deficiency, unspecified: Secondary | ICD-10-CM | POA: Diagnosis not present

## 2017-09-27 LAB — CBC WITH DIFFERENTIAL/PLATELET
BASOS ABS: 49 {cells}/uL (ref 0–200)
Basophils Relative: 0.9 %
EOS PCT: 6.5 %
Eosinophils Absolute: 351 cells/uL (ref 15–500)
HCT: 44.4 % (ref 38.5–50.0)
Hemoglobin: 15 g/dL (ref 13.2–17.1)
Lymphs Abs: 2257 cells/uL (ref 850–3900)
MCH: 30.9 pg (ref 27.0–33.0)
MCHC: 33.8 g/dL (ref 32.0–36.0)
MCV: 91.5 fL (ref 80.0–100.0)
MONOS PCT: 11.8 %
MPV: 11.5 fL (ref 7.5–12.5)
NEUTROS PCT: 39 %
Neutro Abs: 2106 cells/uL (ref 1500–7800)
Platelets: 162 10*3/uL (ref 140–400)
RBC: 4.85 10*6/uL (ref 4.20–5.80)
RDW: 13.2 % (ref 11.0–15.0)
Total Lymphocyte: 41.8 %
WBC mixed population: 637 cells/uL (ref 200–950)
WBC: 5.4 10*3/uL (ref 3.8–10.8)

## 2017-09-27 LAB — COMPLETE METABOLIC PANEL WITH GFR
AG RATIO: 2 (calc) (ref 1.0–2.5)
ALKALINE PHOSPHATASE (APISO): 68 U/L (ref 40–115)
ALT: 11 U/L (ref 9–46)
AST: 20 U/L (ref 10–35)
Albumin: 4.6 g/dL (ref 3.6–5.1)
BUN: 15 mg/dL (ref 7–25)
CO2: 29 mmol/L (ref 20–32)
Calcium: 9.6 mg/dL (ref 8.6–10.3)
Chloride: 101 mmol/L (ref 98–110)
Creat: 0.91 mg/dL (ref 0.70–1.25)
GFR, Est African American: 105 mL/min/{1.73_m2} (ref >=60)
GFR, Est Non African American: 91 mL/min/{1.73_m2} (ref >=60)
GLOBULIN: 2.3 g/dL (ref 1.9–3.7)
Glucose, Bld: 101 mg/dL — ABNORMAL HIGH (ref 65–99)
POTASSIUM: 3.5 mmol/L (ref 3.5–5.3)
Sodium: 139 mmol/L (ref 135–146)
Total Bilirubin: 0.6 mg/dL (ref 0.2–1.2)
Total Protein: 6.9 g/dL (ref 6.1–8.1)

## 2017-09-27 LAB — LIPID PANEL
CHOLESTEROL: 149 mg/dL (ref <200)
HDL: 39 mg/dL — AB (ref >40)
LDL Cholesterol (Calc): 82 mg/dL (calc)
NON-HDL CHOLESTEROL (CALC): 110 mg/dL (ref <130)
Total CHOL/HDL Ratio: 3.8 (calc) (ref <5.0)
Triglycerides: 186 mg/dL — ABNORMAL HIGH (ref <150)

## 2017-09-27 LAB — TSH: TSH: 1.45 mIU/L (ref 0.40–4.50)

## 2017-09-27 MED ORDER — HYDROCHLOROTHIAZIDE 25 MG PO TABS: 25.0000 mg | ORAL_TABLET | Freq: Every day | ORAL | 1 refills | Status: DC

## 2017-09-27 MED ORDER — ALLOPURINOL 300 MG PO TABS: ORAL_TABLET | ORAL | 1 refills | Status: DC

## 2017-09-27 MED ORDER — TAMSULOSIN HCL 0.4 MG PO CAPS: 0.4000 mg | ORAL_CAPSULE | Freq: Every day | ORAL | 1 refills | Status: DC

## 2017-09-27 MED ORDER — PREDNISONE 20 MG PO TABS: ORAL_TABLET | ORAL | 0 refills | Status: AC

## 2017-09-27 MED ORDER — TIZANIDINE HCL 4 MG PO CAPS: 4.0000 mg | ORAL_CAPSULE | Freq: Three times a day (TID) | ORAL | 1 refills | Status: DC | PRN

## 2017-09-27 NOTE — Patient Instructions (Signed)
Back Exercises If you have pain in your back, do these exercises 2-3 times each day or as told by your doctor. When the pain goes away, do the exercises once each day, but repeat the steps more times for each exercise (do more repetitions). If you do not have pain in your back, do these exercises once each day or as told by your doctor. Exercises Single Knee to Chest  Do these steps 3-5 times in a row for each leg: 1. Lie on your back on a firm bed or the floor with your legs stretched out. 2. Bring one knee to your chest. 3. Hold your knee to your chest by grabbing your knee or thigh. 4. Pull on your knee until you feel a gentle stretch in your lower back. 5. Keep doing the stretch for 10-30 seconds. 6. Slowly let go of your leg and straighten it.  Pelvic Tilt  Do these steps 5-10 times in a row: 1. Lie on your back on a firm bed or the floor with your legs stretched out. 2. Bend your knees so they point up to the ceiling. Your feet should be flat on the floor. 3. Tighten your lower belly (abdomen) muscles to press your lower back against the floor. This will make your tailbone point up to the ceiling instead of pointing down to your feet or the floor. 4. Stay in this position for 5-10 seconds while you gently tighten your muscles and breathe evenly.  Cat-Cow  Do these steps until your lower back bends more easily: 1. Get on your hands and knees on a firm surface. Keep your hands under your shoulders, and keep your knees under your hips. You may put padding under your knees. 2. Let your head hang down, and make your tailbone point down to the floor so your lower back is round like the back of a cat. 3. Stay in this position for 5 seconds. 4. Slowly lift your head and make your tailbone point up to the ceiling so your back hangs low (sags) like the back of a cow. 5. Stay in this position for 5 seconds.  Press-Ups  Do these steps 5-10 times in a row: 1. Lie on your belly (face-down)  on the floor. 2. Place your hands near your head, about shoulder-width apart. 3. While you keep your back relaxed and keep your hips on the floor, slowly straighten your arms to raise the top half of your body and lift your shoulders. Do not use your back muscles. To make yourself more comfortable, you may change where you place your hands. 4. Stay in this position for 5 seconds. 5. Slowly return to lying flat on the floor.  Bridges  Do these steps 10 times in a row: 1. Lie on your back on a firm surface. 2. Bend your knees so they point up to the ceiling. Your feet should be flat on the floor. 3. Tighten your butt muscles and lift your butt off of the floor until your waist is almost as high as your knees. If you do not feel the muscles working in your butt and the back of your thighs, slide your feet 1-2 inches farther away from your butt. 4. Stay in this position for 3-5 seconds. 5. Slowly lower your butt to the floor, and let your butt muscles relax.  If this exercise is too easy, try doing it with your arms crossed over your chest. Belly Crunches  Do these steps 5-10 times in   a row: 1. Lie on your back on a firm bed or the floor with your legs stretched out. 2. Bend your knees so they point up to the ceiling. Your feet should be flat on the floor. 3. Cross your arms over your chest. 4. Tip your chin a little bit toward your chest but do not bend your neck. 5. Tighten your belly muscles and slowly raise your chest just enough to lift your shoulder blades a tiny bit off of the floor. 6. Slowly lower your chest and your head to the floor.  Back Lifts Do these steps 5-10 times in a row: 1. Lie on your belly (face-down) with your arms at your sides, and rest your forehead on the floor. 2. Tighten the muscles in your legs and your butt. 3. Slowly lift your chest off of the floor while you keep your hips on the floor. Keep the back of your head in line with the curve in your back. Look at  the floor while you do this. 4. Stay in this position for 3-5 seconds. 5. Slowly lower your chest and your face to the floor.  Contact a doctor if:  Your back pain gets a lot worse when you do an exercise.  Your back pain does not lessen 2 hours after you exercise. If you have any of these problems, stop doing the exercises. Do not do them again unless your doctor says it is okay. Get help right away if:  You have sudden, very bad back pain. If this happens, stop doing the exercises. Do not do them again unless your doctor says it is okay. This information is not intended to replace advice given to you by your health care provider. Make sure you discuss any questions you have with your health care provider. Document Released: 03/14/2010 Document Revised: 07/18/2015 Document Reviewed: 04/05/2014 Elsevier Interactive Patient Education  2018 [employer].   Tizanidine tablets or capsules What is this medicine? TIZANIDINE (tye ZAN i deen) helps to relieve muscle spasms. It may be used to help in the treatment of multiple sclerosis and spinal cord injury. This medicine may be used for other purposes; ask your health care provider or pharmacist if you have questions. COMMON BRAND NAME(S): Zanaflex What should I tell my health care provider before I take this medicine? They need to know if you have any of these conditions: -kidney disease -liver disease -low blood pressure -mental disorder -an unusual or allergic reaction to tizanidine, other medicines, lactose (tablets only), foods, dyes, or preservatives -pregnant or trying to get pregnant -breast-feeding How should I use this medicine? Take this medicine by mouth with a full glass of water. Take this medicine on an empty stomach, at least 30 minutes before or 2 hours after food. Do not take with food unless you talk with your doctor. Follow the directions on the prescription label. Take your medicine at regular intervals. Do not take  your medicine more often than directed. Do not stop taking except on your doctor's advice. Suddenly stopping the medicine can be very dangerous. Talk to your pediatrician regarding the use of this medicine in children. Patients over 62 years old may have a stronger reaction and need a smaller dose. Overdosage: If you think you have taken too much of this medicine contact a poison control center or emergency room at once. NOTE: This medicine is only for you. Do not share this medicine with others. What if I miss a dose? If you miss a dose,  take it as soon as you can. If it is almost time for your next dose, take only that dose. Do not take double or extra doses. What may interact with this medicine? Do not take this medicine with any of the following medications: -ciprofloxacin -cisapride -dofetilide -dronedarone -fluvoxamine -narcotic medicines for cough -pimozide -thiabendazole -thioridazine -ziprasidone This medicine may also interact with the following medications: -acyclovir -alcohol -antihistamines for allergy, cough and cold -baclofen -certain antibiotics like levofloxacin, ofloxacin -certain medicines for anxiety or sleep -certain medicines for blood pressure, heart disease, irregular heart beat -certain medicines for depression like amitriptyline, fluoxetine, sertraline -certain medicines for seizures like phenobarbital, primidone -certain medicines for stomach problems like cimetidine, famotidine -male hormones, like estrogens or progestins and birth control pills, patches, rings, or injections -general anesthetics like halothane, isoflurane, methoxyflurane, propofol -local anesthetics like lidocaine, pramoxine, tetracaine -medicines that relax muscles for surgery -narcotic medicines for pain -other medicines that prolong the QT interval (cause an abnormal heart rhythm) -phenothiazines like chlorpromazine, mesoridazine, prochlorperazine -ticlopidine -zileuton This  list may not describe all possible interactions. Give your health care provider a list of all the medicines, herbs, non-prescription drugs, or dietary supplements you use. Also tell them if you smoke, drink alcohol, or use illegal drugs. Some items may interact with your medicine. What should I watch for while using this medicine? Tell your doctor or health care professional if your symptoms do not start to get better or if they get worse. You may get drowsy or dizzy. Do not drive, use machinery, or do anything that needs mental alertness until you know how this medicine affects you. Do not stand or sit up quickly, especially if you are an older patient. This reduces the risk of dizzy or fainting spells. Alcohol may interfere with the effect of this medicine. Avoid alcoholic drinks. If you are taking another medicine that also causes drowsiness, you may have more side effects. Give your health care provider a list of all medicines you use. Your doctor will tell you how much medicine to take. Do not take more medicine than directed. Call emergency for help if you have problems breathing or unusual sleepiness. Your mouth may get dry. Chewing sugarless gum or sucking hard candy, and drinking plenty of water may help. Contact your doctor if the problem does not go away or is severe. What side effects may I notice from receiving this medicine? Side effects that you should report to your doctor or health care professional as soon as possible: -allergic reactions like skin rash, itching or hives, swelling of the face, lips, or tongue -breathing problems -hallucinations -signs and symptoms of liver injury like dark yellow or brown urine; general ill feeling or flu-like symptoms; light-colored stools; loss of appetite; nausea; right upper quadrant belly pain; unusually weak or tired; yellowing of the eyes or skin -signs and symptoms of low blood pressure like dizziness; feeling faint or lightheaded, falls;  unusually weak or tired -unusually slow heartbeat -unusually weak or tired Side effects that usually do not require medical attention (report to your doctor or health care professional if they continue or are bothersome): -blurred vision -constipation -dizziness -dry mouth -tiredness This list may not describe all possible side effects. Call your doctor for medical advice about side effects. You may report side effects to FDA at 1-800-FDA-1088. Where should I keep my medicine? Keep out of the reach of children. Store at room temperature between 15 and 30 degrees C (59 and 86 degrees F). Throw away any  unused medicine after the expiration date. NOTE: This sheet is a summary. It may not cover all possible information. If you have questions about this medicine, talk to your doctor, pharmacist, or health care provider.  2018 Elsevier/Gold Standard (2014-11-20 13:52:12)

## 2017-11-08 ENCOUNTER — Encounter: Payer: Self-pay | Admitting: Neurology

## 2017-11-08 ENCOUNTER — Encounter

## 2017-11-08 ENCOUNTER — Ambulatory Visit: Payer: BLUE CROSS/BLUE SHIELD | Admitting: Neurology

## 2017-11-08 VITALS — BP 104/70 | HR 62 | Ht 68.5 in | Wt 195.2 lb

## 2017-11-08 DIAGNOSIS — R001 Bradycardia, unspecified: Secondary | ICD-10-CM | POA: Diagnosis not present

## 2017-11-08 DIAGNOSIS — M5416 Radiculopathy, lumbar region: Secondary | ICD-10-CM | POA: Diagnosis not present

## 2017-11-08 MED ORDER — GABAPENTIN 300 MG PO CAPS: ORAL_CAPSULE | ORAL | 5 refills | Status: DC

## 2017-11-08 NOTE — Patient Instructions (Addendum)
Start gabapentin 300mg  at bedtime x 1 week, then increase to 1 tablet  Start physical therapy for low back stretching and strengthening  If there is no improvement, the next step is MRI lumbar spine  Return to clinic in December

## 2017-11-08 NOTE — Progress Notes (Signed)
Caleb Johns - Initial Visit   Date: 11/08/17  Caleb Johns MRN: [id number] DOB: 1956/01/03   Dear Dr. Melford Aase:  Thank you for your kind referral of Caleb Johns for consultation of right leg pain. Although his history is well known to you, please allow [place] to reiterate it for the purpose of our medical record. The patient was accompanied to the clinic by self.   History of Present Illness: Caleb Johns is a 62 y.o. right-handed African American male with diabetes mellitus, hypertension, hyperlipidemia, prediabetes, gout, and ED presenting for evaluation of right leg pain.    Starting around February 2019, he began having intermittent spells of shooting spells of sharp pain which starts in the right buttocks and radiates down the back and lateral thigh down into this lower leg and foot.  It occurs 3-4 times daily, lasting 15-20 minutes.  He usually tries to walk it off, which slowly eases the pain.  He has noticed that prolonged standing can trigger pain. He does not have low back pain, cramps, or numbness/tingling. He does not have weakness, except when his pain occurs.  He was given prednisone which helped initially in June.  He did not appreciate benefit with tizanidine.  He does not have similar symptoms on the left leg.  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   Out-side paper records, electronic medical record, and images have been reviewed where available and summarized as:  Lab Results  Component Value Date   TSH 1.45 09/27/2017   Lab Results  Component Value Date   CZYSAYTK16 010 03/26/2016   Lab Results  Component Value Date   HGBA1C 5.6 03/29/2017   Lab Results  Component Value Date   CHOL 149 09/27/2017   HDL 39 (L) 09/27/2017   LDLCALC 82 09/27/2017   TRIG 186 (H) 09/27/2017   CHOLHDL 3.8 09/27/2017     Past Medical History:  Diagnosis Date  . GERD (gastroesophageal reflux disease)   . Gout   . HSV-1 (herpes  simplex virus 1) infection   . HSV-2 (herpes simplex virus 2) infection   . Hyperlipidemia   . Hypertension   . Hypogonadism male   . Obese   . Pneumonia    walking -many years ago  . Pre-diabetes     Past Surgical History:  Procedure Laterality Date  . BACK SURGERY  2012   on back-hernia  . HERNIA REPAIR    . INSERTION OF MESH N/A 08/15/2013   Procedure: INSERTION OF MESH;  Surgeon: Odis Hollingshead, MD;  Location: [facility];  Service: General;  Laterality: N/A;  . LAPAROSCOPIC INGUINAL HERNIA WITH UMBILICAL HERNIA Right 9/32/3557   Procedure: LAPAROSCOPIC RIGH  INGUINAL HERNIA WITH OPEN UMBILICAL HERNIA;  Surgeon: Odis Hollingshead, MD;  Location: [facility];  Service: General;  Laterality: Right;  umbilical repair with mesh     Medications:  Outpatient Encounter Medications as of 11/08/2017  Medication Sig Johns  . acyclovir (ZOVIRAX) 800 MG tablet Take 1 tablet (800 mg total) daily by mouth.   Marland Kitchen allopurinol (ZYLOPRIM) 300 MG tablet TAKE 1/2 TABLET BY MOUTH ONCE DAILY FOR  GOUT   . alum & mag hydroxide-simeth (MAALOX/MYLANTA) 200-200-20 MG/5ML suspension Take 30 mLs by mouth 2 (two) times daily.   Marland Kitchen aspirin EC 81 MG tablet Take 81 mg by mouth daily.   Marland Kitchen atenolol (TENORMIN) 100 MG tablet TAKE ONE TABLET BY MOUTH ONCE DAILY FOR BLOOD PRESSURE   .  B Complex-C (SUPER B COMPLEX PO) Take 1 tablet by mouth daily.   . Cholecalciferol (VITAMIN D) 2000 UNITS CAPS Take 2,000 Units by mouth daily.   . Flaxseed, Linseed, (FLAX SEED OIL) 1000 MG CAPS Take 1,000 mg by mouth daily.   . fluticasone (FLONASE) 50 MCG/ACT nasal spray USE TWO SPRAY(S) IN EACH NOSTRIL ONCE DAILY   . glucose blood (FREESTYLE TEST STRIPS) test strip Use as instructed, DX 250.00   . hydrochlorothiazide (HYDRODIURIL) 25 MG tablet Take 1 tablet (25 mg total) by mouth daily.   . Lancets (FREESTYLE) lancets  01/22/2014: Received from: External Pharmacy  . loratadine (EQ ALLERGY RELIEF) 10 MG tablet TAKE 1 TABLET BY MOUTH DAILY  FOR ALLERGIES   . Omega-3 Fatty Acids (FISH OIL) 1000 MG CAPS Take 1,000 mg by mouth daily.   . pravastatin (PRAVACHOL) 40 MG tablet TAKE ONE TABLET BY MOUTH ONCE DAILY   . ranitidine (ZANTAC) 300 MG tablet Take 1 tablet (300 mg total) by mouth 2 (two) times daily as needed.   . sildenafil (VIAGRA) 100 MG tablet Take 1/2 to 1 tablet daily as needed for XXXX   . tamsulosin (FLOMAX) 0.4 MG CAPS capsule Take 1 capsule (0.4 mg total) by mouth at bedtime.   Marland Kitchen tiZANidine (ZANAFLEX) 4 MG capsule Take 1 capsule (4 mg total) by mouth 3 (three) times daily as needed for muscle spasms.   Marland Kitchen triamcinolone ointment (KENALOG) 0.1 % Apply 1 application topically 2 (two) times daily.   Marland Kitchen gabapentin (NEURONTIN) 300 MG capsule Take 1 tablet at bedtime x 1 week, then increase to 1 tablet twice daily    No facility-administered encounter medications on file as of 11/08/2017.      Allergies:  Allergies  Allergen Reactions  . Ace Inhibitors     REACTION: cough  . Trilipix [Choline Fenofibrate] Other (See Comments)    Heart burn    Family History: Family History  Problem Relation Age of Onset  . Diabetes Mother   . Hypertension Mother   . Hypertension Brother   . Hypertension Brother   . Hypertension Brother   . Cancer Brother 65       colon  . Colon cancer Neg Hx     Social History: Social History   Tobacco Use  . Smoking status: Former Smoker    Last attempt to quit: 02/23/2002    Years since quitting: 15.7  . Smokeless tobacco: Never Used  Substance Use Topics  . Alcohol use: No  . Drug use: No   Social History   Social History Narrative   Lives alone in a one story home.  Has 4 daughters and 1 son.  Works in a warehouse loading trucks.  Education: high school.     Review of Systems:  CONSTITUTIONAL: No fevers, chills, night sweats, or weight loss.   EYES: No visual changes or eye pain ENT: No hearing changes.  No history of nose bleeds.   RESPIRATORY: No cough, wheezing and  shortness of breath.   CARDIOVASCULAR: Negative for chest pain, and palpitations.   GI: Negative for abdominal discomfort, blood in stools or black stools.  No recent change in bowel habits.   GU:  No history of incontinence.   MUSCLOSKELETAL: +history of joint pain or swelling.  No myalgias.   SKIN: Negative for lesions, rash, and itching.   HEMATOLOGY/ONCOLOGY: Negative for prolonged bleeding, bruising easily, and swollen nodes.  No history of cancer.   ENDOCRINE: Negative for cold or heat intolerance,  polydipsia or goiter.   PSYCH:  No depression or anxiety symptoms.   NEURO: As Above.   Vital Signs:  BP 104/70   Pulse 62   Ht 5' 8.5" (1.74 m)   Wt 195 lb 4 oz (88.6 kg)   SpO2 95%   BMI 29.26 kg/m    General Medical Exam:   General:  Well appearing, comfortable.   Eyes/ENT: see cranial nerve examination.   Neck: No masses appreciated.  Full range of motion without tenderness.  No carotid bruits. Respiratory:  Clear to auscultation, good air entry bilaterally.   Cardiac:  Regular rate and rhythm, no murmur.   Extremities:  No deformities, edema, or skin discoloration.  Skin:  No rashes or lesions.  Neurological Exam: MENTAL STATUS including orientation to time, place, person, recent and remote memory, attention span and concentration, language, and fund of knowledge is normal.  Speech is not dysarthric.  CRANIAL NERVES: II:  No visual field defects.  Unremarkable fundi.   III-IV-VI: Pupils equal round and reactive to light.  Normal conjugate, extra-ocular eye movements in all directions of gaze.  No nystagmus.  No ptosis.   V:  Normal facial sensation.     VII:  Normal facial symmetry and movements.  VIII:  Normal hearing and vestibular function.   IX-X:  Normal palatal movement.   XI:  Normal shoulder shrug and head rotation.   XII:  Normal tongue strength and range of motion, no deviation or fasciculation.  MOTOR:  No atrophy, fasciculations or abnormal movements.  No  pronator drift.  Tone is normal.    Right Upper Extremity:    Left Upper Extremity:    Deltoid  5/5   Deltoid  5/5   Biceps  5/5   Biceps  5/5   Triceps  5/5   Triceps  5/5   Wrist extensors  5/5   Wrist extensors  5/5   Wrist flexors  5/5   Wrist flexors  5/5   Finger extensors  5/5   Finger extensors  5/5   Finger flexors  5/5   Finger flexors  5/5   Dorsal interossei  5/5   Dorsal interossei  5/5   Abductor pollicis  5/5   Abductor pollicis  5/5   Tone (Ashworth scale)  0  Tone (Ashworth scale)  0   Right Lower Extremity:    Left Lower Extremity:    Hip flexors  5/5   Hip flexors  5/5   Hip extensors  5/5   Hip extensors  5/5   Knee flexors  5/5   Knee flexors  5/5   Knee extensors  5/5   Knee extensors  5/5   Dorsiflexors  5/5   Dorsiflexors  5/5   Plantarflexors  5/5   Plantarflexors  5/5   Toe extensors  5/5   Toe extensors  5/5   Toe flexors  5/5   Toe flexors  5/5   Tone (Ashworth scale)  0  Tone (Ashworth scale)  0   MSRs:  Right                                                                 Left brachioradialis 2+  brachioradialis 2+  biceps 2+  biceps 2+  triceps  2+  triceps 2+  patellar 1+  patellar 2+  ankle jerk 2+  ankle jerk 2+  Hoffman no  Hoffman no  plantar response down  plantar response down   SENSORY:  Normal and symmetric perception of light touch, pinprick, vibration, and proprioception.  Romberg's sign absent.   COORDINATION/GAIT: Normal finger-to- nose-finger and heel-to-shin.  Intact rapid alternating movements bilaterally.  Able to rise from a chair without using arms.  Gait narrow based and stable. Tandem and stressed gait intact.    IMPRESSION: Right lumbar radiculopathy affecting L4-5 nerve root based on the distribution of his radicular pain and asymmetrically reduced right patella reflex.  Motor strength and sensation is intact.   - Start physical therapy for low back stretching and strengthening  - Start gabapentin 300mg  at bedtime x 1  week, then increase to 1 tablet twice daily.  Side effects of sedation discussed, caution when driving  - Continue tizanidine 2mg  at bedtime  - If no improvement in 2-3 months, he will need MRI lumbar spine   Asymptomatic sinus bradycardia.  HR initially was 48, rechecked and 62.  Most likely medication-induced as he takes atenolol.  He denies chest pain, fatigue, or shortness of breath.   Return to clinic in 3 months.   Thank you for allowing me to participate in patient's care.  If I can answer any additional questions, I would be pleased to do so.    Sincerely,    Sherley Mckenney K. Posey Pronto, DO

## 2017-11-15 ENCOUNTER — Telehealth: Payer: Self-pay | Admitting: Neurology

## 2017-11-15 NOTE — Telephone Encounter (Signed)
Patient states that he needs to talk to someone the medication is not helping at all. He feels like he is in more pain than before please call

## 2017-11-15 NOTE — Telephone Encounter (Signed)
Patient is supposed to go up on the gabapentin to bid today and I even instructed him to take it tid per Dr. Posey Pronto.  He said that he would try it but would call me back if it is not working.

## 2017-11-23 DIAGNOSIS — M79604 Pain in right leg: Secondary | ICD-10-CM | POA: Diagnosis not present

## 2017-11-30 DIAGNOSIS — M79604 Pain in right leg: Secondary | ICD-10-CM | POA: Diagnosis not present

## 2017-12-02 DIAGNOSIS — M79604 Pain in right leg: Secondary | ICD-10-CM | POA: Diagnosis not present

## 2017-12-06 ENCOUNTER — Telehealth: Payer: Self-pay | Admitting: *Deleted

## 2017-12-06 NOTE — Telephone Encounter (Signed)
Patient requested a refill of Viagra. Per Epic, the patient received #500 tablet from the [facility] on 03/08/2017.  Per Dr Melford Aase, he should only take 1 tablet per day and it is too soon to refill.  Patient is aware.

## 2017-12-07 DIAGNOSIS — M79604 Pain in right leg: Secondary | ICD-10-CM | POA: Diagnosis not present

## 2017-12-10 ENCOUNTER — Other Ambulatory Visit: Payer: Self-pay | Admitting: *Deleted

## 2017-12-10 ENCOUNTER — Telehealth: Payer: Self-pay | Admitting: Neurology

## 2017-12-10 MED ORDER — GABAPENTIN 300 MG PO CAPS: ORAL_CAPSULE | ORAL | 5 refills | Status: DC

## 2017-12-10 NOTE — Telephone Encounter (Signed)
I spoke with patient and informed him that we will increase his gabapentin to 600 mg tid per Dr. Posey Pronto.  New Rx sent in.

## 2017-12-10 NOTE — Telephone Encounter (Signed)
Patient called and is needing to have his Prescription increased. He said the dosage that it's at is not helping. He cannot remember the name of the medication. He uses Paediatric nurse in [facility]. Thanks

## 2017-12-21 DIAGNOSIS — M79604 Pain in right leg: Secondary | ICD-10-CM | POA: Diagnosis not present

## 2018-01-19 ENCOUNTER — Other Ambulatory Visit: Payer: Self-pay | Admitting: Adult Health

## 2018-01-19 ENCOUNTER — Telehealth: Payer: Self-pay

## 2018-01-19 MED ORDER — FAMOTIDINE 20 MG PO TABS: 20.0000 mg | ORAL_TABLET | Freq: Two times a day (BID) | ORAL | 1 refills | Status: DC

## 2018-01-19 NOTE — Telephone Encounter (Signed)
Pharmacy states the Ranitidine 300mg  tabs are currently unavailable. Patient is requesting a temporary alternative if appropriate.

## 2018-01-25 NOTE — Telephone Encounter (Signed)
Patient notified

## 2018-01-26 ENCOUNTER — Ambulatory Visit: Payer: BLUE CROSS/BLUE SHIELD | Admitting: Neurology

## 2018-02-03 ENCOUNTER — Telehealth: Payer: Self-pay

## 2018-02-03 NOTE — Telephone Encounter (Signed)
Pepcid is not available. Requesting a new prescription.

## 2018-02-04 ENCOUNTER — Telehealth: Payer: Self-pay | Admitting: *Deleted

## 2018-02-04 NOTE — Telephone Encounter (Signed)
Left detailed message on VM.

## 2018-02-04 NOTE — Telephone Encounter (Signed)
Pt returned Melissa's call. Pt says not only do they not have med but insurance will not cover the Generic Pepcid?

## 2018-02-04 NOTE — Telephone Encounter (Signed)
LMTCB

## 2018-02-10 DIAGNOSIS — H524 Presbyopia: Secondary | ICD-10-CM | POA: Diagnosis not present

## 2018-02-10 DIAGNOSIS — H52223 Regular astigmatism, bilateral: Secondary | ICD-10-CM | POA: Diagnosis not present

## 2018-02-10 DIAGNOSIS — H5201 Hypermetropia, right eye: Secondary | ICD-10-CM | POA: Diagnosis not present

## 2018-02-10 DIAGNOSIS — H5212 Myopia, left eye: Secondary | ICD-10-CM | POA: Diagnosis not present

## 2018-02-28 ENCOUNTER — Other Ambulatory Visit: Payer: Self-pay | Admitting: Internal Medicine

## 2018-02-28 DIAGNOSIS — N529 Male erectile dysfunction, unspecified: Secondary | ICD-10-CM

## 2018-02-28 MED ORDER — SILDENAFIL CITRATE 100 MG PO TABS: ORAL_TABLET | ORAL | 3 refills | Status: DC

## 2018-03-01 ENCOUNTER — Telehealth: Payer: Self-pay | Admitting: *Deleted

## 2018-03-01 NOTE — Telephone Encounter (Signed)
An RX for Sildenafil 100 mg #500 was faxed to [facility] at [phone], per Dr Melford Aase.

## 2018-03-14 ENCOUNTER — Other Ambulatory Visit: Payer: Self-pay | Admitting: Adult Health

## 2018-03-14 ENCOUNTER — Other Ambulatory Visit: Payer: Self-pay | Admitting: Internal Medicine

## 2018-03-14 DIAGNOSIS — B009 Herpesviral infection, unspecified: Secondary | ICD-10-CM

## 2018-03-14 DIAGNOSIS — I10 Essential (primary) hypertension: Secondary | ICD-10-CM

## 2018-03-14 DIAGNOSIS — M1A00X0 Idiopathic chronic gout, unspecified site, without tophus (tophi): Secondary | ICD-10-CM

## 2018-03-22 ENCOUNTER — Other Ambulatory Visit: Payer: Self-pay | Admitting: Internal Medicine

## 2018-03-22 DIAGNOSIS — K219 Gastro-esophageal reflux disease without esophagitis: Secondary | ICD-10-CM

## 2018-03-22 MED ORDER — FAMOTIDINE 20 MG PO TABS: 20.0000 mg | ORAL_TABLET | Freq: Two times a day (BID) | ORAL | 1 refills | Status: DC

## 2018-04-05 ENCOUNTER — Other Ambulatory Visit: Payer: Self-pay

## 2018-04-05 ENCOUNTER — Encounter: Payer: Self-pay | Admitting: Adult Health Nurse Practitioner

## 2018-04-05 ENCOUNTER — Ambulatory Visit (INDEPENDENT_AMBULATORY_CARE_PROVIDER_SITE_OTHER): Payer: BLUE CROSS/BLUE SHIELD | Admitting: Adult Health Nurse Practitioner

## 2018-04-05 ENCOUNTER — Encounter: Payer: Self-pay | Admitting: Physician Assistant

## 2018-04-05 VITALS — BP 126/88 | HR 65 | Temp 97.9°F | Ht 68.5 in | Wt 211.6 lb

## 2018-04-05 DIAGNOSIS — E782 Mixed hyperlipidemia: Secondary | ICD-10-CM

## 2018-04-05 DIAGNOSIS — Z130 Encounter for screening for diseases of the blood and blood-forming organs and certain disorders involving the immune mechanism: Secondary | ICD-10-CM | POA: Diagnosis not present

## 2018-04-05 DIAGNOSIS — Z0000 Encounter for general adult medical examination without abnormal findings: Secondary | ICD-10-CM | POA: Diagnosis not present

## 2018-04-05 DIAGNOSIS — Z1159 Encounter for screening for other viral diseases: Secondary | ICD-10-CM

## 2018-04-05 DIAGNOSIS — Z9189 Other specified personal risk factors, not elsewhere classified: Secondary | ICD-10-CM

## 2018-04-05 DIAGNOSIS — N529 Male erectile dysfunction, unspecified: Secondary | ICD-10-CM

## 2018-04-05 DIAGNOSIS — R8290 Unspecified abnormal findings in urine: Secondary | ICD-10-CM

## 2018-04-05 DIAGNOSIS — J3089 Other allergic rhinitis: Secondary | ICD-10-CM

## 2018-04-05 DIAGNOSIS — Z131 Encounter for screening for diabetes mellitus: Secondary | ICD-10-CM

## 2018-04-05 DIAGNOSIS — Z79899 Other long term (current) drug therapy: Secondary | ICD-10-CM

## 2018-04-05 DIAGNOSIS — K219 Gastro-esophageal reflux disease without esophagitis: Secondary | ICD-10-CM

## 2018-04-05 DIAGNOSIS — Z1389 Encounter for screening for other disorder: Secondary | ICD-10-CM | POA: Diagnosis not present

## 2018-04-05 DIAGNOSIS — R7303 Prediabetes: Secondary | ICD-10-CM

## 2018-04-05 DIAGNOSIS — Z13220 Encounter for screening for lipoid disorders: Secondary | ICD-10-CM | POA: Diagnosis not present

## 2018-04-05 DIAGNOSIS — M1A00X0 Idiopathic chronic gout, unspecified site, without tophus (tophi): Secondary | ICD-10-CM

## 2018-04-05 DIAGNOSIS — I10 Essential (primary) hypertension: Secondary | ICD-10-CM

## 2018-04-05 DIAGNOSIS — R351 Nocturia: Secondary | ICD-10-CM

## 2018-04-05 DIAGNOSIS — E349 Endocrine disorder, unspecified: Secondary | ICD-10-CM

## 2018-04-05 DIAGNOSIS — Z136 Encounter for screening for cardiovascular disorders: Secondary | ICD-10-CM

## 2018-04-05 DIAGNOSIS — Z6831 Body mass index (BMI) 31.0-31.9, adult: Secondary | ICD-10-CM

## 2018-04-05 DIAGNOSIS — Z1329 Encounter for screening for other suspected endocrine disorder: Secondary | ICD-10-CM

## 2018-04-05 DIAGNOSIS — E559 Vitamin D deficiency, unspecified: Secondary | ICD-10-CM

## 2018-04-05 DIAGNOSIS — N401 Enlarged prostate with lower urinary tract symptoms: Secondary | ICD-10-CM

## 2018-04-05 MED ORDER — SILDENAFIL CITRATE 100 MG PO TABS: ORAL_TABLET | ORAL | 0 refills | Status: DC

## 2018-04-05 MED ORDER — SILDENAFIL CITRATE 100 MG PO TABS: ORAL_TABLET | ORAL | 3 refills | Status: DC

## 2018-04-05 MED ORDER — FLUTICASONE PROPIONATE 50 MCG/ACT NA SUSP: 2.0000 | Freq: Every day | NASAL | 3 refills | Status: DC

## 2018-04-05 NOTE — Progress Notes (Signed)
Complete Physical  Assessment and Plan:   Mehdi was seen today for annual exam.  Diagnoses and all orders for this visit:  Essential hypertension - continue medications, DASH diet, exercise and monitor at home. Call if greater than 130/80.  -     CBC with Differential/Platelet -     COMPLETE METABOLIC PANEL WITH GFR -     Microalbumin / creatinine urine ratio -     EKG 12-Lead  Hyperlipidemia decrease fatty foods increase activity.  -     Lipid panel  Vitamin D deficiency -     VITAMIN D 25 Hydroxy (Vit-D Deficiency)  Gastroesophageal reflux disease, esophagitis presence not specified Doing well, no longer taking pepsid Discussed monitoring dietary intake -     Magnesium  Idiopathic chronic gout without tophus, unspecified site Continue Allopurinol Denies any flares continue with benefit  Testosterone deficiency Not taking supplementation at this time Does not want levels checked Discussed taking OTC Zinc   Non-seasonal allergic rhinitis, unspecified trigger Doing well with current regiment -     fluticasone (FLONASE) 50 MCG/ACT nasal spray; Place 2 sprays into both nostrils daily.  Screening for blood or protein in urine -     Urinalysis w microscopic + reflex cultur  Has multiple sexual partners Discussed safe sex practices No know exposure -     C. trachomatis/N. gonorrhoeae RNA  Benign prostatic hyperplasia with nocturia Doing well at this time Continue Tamsulosin 0.4mg  at bedtime -     PSA  Erectile dysfunction, unspecified erectile dysfunction type Rx sildenafil (VIAGRA) 100 MG tablet; Take 1/2 to 1 tablet daily as needed for XXXX  BMI 31.0-31.9,adult Discussed dietary and exercise modifications -     Hemoglobin A1c -     Insulin, random  Prediabetes Discussed dietary and exercise modifications -     Hemoglobin A1c -     Insulin, random  Abnormal urine findings -     REFLEXIVE URINE CULTURE -     Urine Culture  Medication management -      CBC with Differential/Platelet -     COMPLETE METABOLIC PANEL WITH GFR -     Magnesium -     Lipid panel -     TSH -     Hemoglobin A1c -     Insulin, random -     VITAMIN D 25 Hydroxy (Vit-D Deficiency, Fractures) -     PSA -     Vitamin B12 -     Microalbumin / creatinine urine ratio -     Urinalysis w microscopic + reflex cultur -     EKG 12-Lead   Discussed med's effects and SE's. Screening labs and tests as requested with regular follow-up as recommended. Over 40 minutes of exam, counseling, chart review and critical decision making was performed  HPI Patient presents for a complete physical.  Reports he has not had a gout flare in a long time.    His blood pressure has been controlled at home, today their BP is BP: 126/88 He does not workout. He denies chest pain, shortness of breath, dizziness.  He is on cholesterol medication and denies myalgias. His cholesterol is not at goal. The cholesterol last visit was:   Lab Results  Component Value Date   CHOL 134 04/05/2018   HDL 37 (L) 04/05/2018   LDLCALC 75 04/05/2018   TRIG 136 04/05/2018   CHOLHDL 3.6 04/05/2018   He has not been working on diet and exercise for prediabetes, he  is on bASA, he is not on ACE/ARB and denies hyperglycemia, hypoglycemia , nausea, polydipsia and polyuria. Last A1C in the office was:  Lab Results  Component Value Date   HGBA1C 5.8 (H) 04/05/2018   Last GFR:   Lab Results  Component Value Date   GFRNONAA 93 04/05/2018     Lab Results  Component Value Date   GFRAA 108 04/05/2018   Patient is on Vitamin D supplement.   Lab Results  Component Value Date   VD25OH 38 04/05/2018     Last PSA was: Lab Results  Component Value Date   PSA 1.6 04/05/2018    Current Medications:  Current Outpatient Medications on File Prior to Visit  Medication Sig Dispense Refill  . acyclovir (ZOVIRAX) 800 MG tablet TAKE 1 TABLET BY MOUTH ONCE DAILY 90 tablet 3  . allopurinol (ZYLOPRIM) 300 MG  tablet TAKE 1 TABLET BY MOUTH ONCE DAILY FOR GOUT 90 tablet 3  . alum & mag hydroxide-simeth (MAALOX/MYLANTA) 425-956-38 MG/5ML suspension Take 30 mLs by mouth 2 (two) times daily.    Marland Kitchen aspirin EC 81 MG tablet Take 81 mg by mouth daily.    Marland Kitchen atenolol (TENORMIN) 100 MG tablet TAKE 1 TABLET BY MOUTH ONCE DAILY FOR BLOOD PRESSURE 90 tablet 0  . B Complex-C (SUPER B COMPLEX PO) Take 1 tablet by mouth daily.    . Cholecalciferol (VITAMIN D) 2000 UNITS CAPS Take 2,000 Units by mouth daily.    . Flaxseed, Linseed, (FLAX SEED OIL) 1000 MG CAPS Take 1,000 mg by mouth daily.    Marland Kitchen gabapentin (NEURONTIN) 300 MG capsule Take 2 tablets (600mg ) three times a day. 180 capsule 5  . glucose blood (FREESTYLE TEST STRIPS) test strip Use as instructed, DX 250.00 100 each 12  . hydrochlorothiazide (HYDRODIURIL) 25 MG tablet Take 1 tablet (25 mg total) by mouth daily. 90 tablet 1  . Lancets (FREESTYLE) lancets   12  . loratadine (EQ ALLERGY RELIEF) 10 MG tablet TAKE 1 TABLET BY MOUTH DAILY FOR ALLERGIES 90 tablet 3  . Omega-3 Fatty Acids (FISH OIL) 1000 MG CAPS Take 1,000 mg by mouth daily.    . pravastatin (PRAVACHOL) 40 MG tablet TAKE 1 TABLET BY MOUTH ONCE DAILY 90 tablet 0  . ranitidine (ZANTAC) 300 MG tablet Take 1 tablet (300 mg total) by mouth 2 (two) times daily as needed. 180 tablet 1  . tamsulosin (FLOMAX) 0.4 MG CAPS capsule Take 1 capsule (0.4 mg total) by mouth at bedtime. 90 capsule 1  . tiZANidine (ZANAFLEX) 4 MG capsule Take 1 capsule (4 mg total) by mouth 3 (three) times daily as needed for muscle spasms. 90 capsule 1  . triamcinolone ointment (KENALOG) 0.1 % Apply 1 application topically 2 (two) times daily. 80 g 1   No current facility-administered medications on file prior to visit.    Allergies:  Allergies  Allergen Reactions  . Ace Inhibitors     REACTION: cough  . Trilipix [Choline Fenofibrate] Other (See Comments)    Heart burn   Health Maintenance:  Immunization History  Administered  Date(s) Administered  . PPD Test 02/13/2014  . Pneumococcal Polysaccharide-23 02/23/2002  . Tdap 05/15/2009    Tetanus: 2011 Pneumovax: 2004 N/A Prevnar 13:  Flu vaccine: 2019, at work Zostavax: Due dicussed   HIV : screening 2018 non-reactive  Colonoscopy: 2018, Due 2021 Polyps EGD: 2008 Eye Exam: 2019, Due for 2020 Dentist: 2019, Due for 2020  Patient Care Team: Unk Pinto, MD as PCP -  General (Internal Medicine)  Medical History:  has Testosterone deficiency; Hyperlipidemia; Essential hypertension; GERD; Vitamin D deficiency; Prediabetes; Medication management; Overweight (BMI 25.0-29.9); and Gout on their problem list. Surgical History:  He  has a past surgical history that includes Back surgery (2012); Laparoscopic inguinal hernia with umbilical hernia (Right, 08/15/2013); Insertion of mesh (N/A, 08/15/2013); and Hernia repair. Family History:  His family history includes Cancer (age of onset: 44) in his brother; Diabetes in his mother; Hypertension in his brother, brother, brother, and mother. Social History:   reports that he quit smoking about 16 years ago. He has never used smokeless tobacco. He reports that he does not drink alcohol or use drugs. Review of Systems:  ROS  Physical Exam: Estimated body mass index is 31.71 kg/m as calculated from the following:   Height as of this encounter: 5' 8.5" (1.74 m).   Weight as of this encounter: 211 lb 9.6 oz (96 kg). BP 126/88   Pulse 65   Temp 97.9 F (36.6 C)   Ht 5' 8.5" (1.74 m)   Wt 211 lb 9.6 oz (96 kg)   SpO2 98%   BMI 31.71 kg/m  General Appearance: Well nourished, in no apparent distress.  Eyes: PERRLA, EOMs, conjunctiva no swelling or erythema, normal fundi and vessels.  Sinuses: No Frontal/maxillary tenderness  ENT/Mouth: Ext aud canals clear, normal light reflex with TMs without erythema, bulging. Good dentition. No erythema, swelling, or exudate on post pharynx. Tonsils not swollen or erythematous.  Hearing normal.  Neck: Supple, thyroid normal. No bruits  Respiratory: Respiratory effort normal, BS equal bilaterally without rales, rhonchi, wheezing or stridor.  Cardio: RRR without murmurs, rubs or gallops. Brisk peripheral pulses without edema.  Chest: symmetric, with normal excursions and percussion.  Abdomen: Soft, nontender, no guarding, rebound, hernias, masses, or organomegaly.  Lymphatics: Non tender without lymphadenopathy.  Genitourinary:  Musculoskeletal: Full ROM all peripheral extremities,5/5 strength, and normal gait.  Skin: Warm, dry without rashes, lesions, ecchymosis. Neuro: Cranial nerves intact, reflexes equal bilaterally. Normal muscle tone, no cerebellar symptoms. Sensation intact.  Psych: Awake and oriented X 3, normal affect, Insight and Judgment appropriate.   EKG:  No ST changes Comparable to previous  Gentle Hoge 1:05 PM [facility]

## 2018-04-05 NOTE — Patient Instructions (Addendum)
We are checking your labs today.  We will contact you in 1-3 days with the lab results.  Be sure to protect yourself against sexually transmitted diseases, use condoms.  Follow up in 3 months   We Do NOT Approve of  [facility], [employer]  Our Patients  To Do Home Visits  & We Do NOT Approve of [employer] > > > > > > > > > > > > > > > > > > > > > > > > > > > > > > > > > > >  > > > >   Preventive Care for Adults  A healthy lifestyle and preventive care can promote health and wellness. Preventive health guidelines for men include the following key practices:  A routine yearly physical is a good way to check with your health care provider about your health and preventative screening. It is a chance to share any concerns and updates on your health and to receive a thorough exam.  Visit your dentist for a routine exam and preventative care every 6 months. Brush your teeth twice a day and floss once a day. Good oral hygiene prevents tooth decay and gum disease.  The frequency of eye exams is based on your age, health, family medical history, use of contact lenses, and other factors. Follow your health care provider's recommendations for frequency of eye exams.  Eat a healthy diet. Foods such as vegetables, fruits, whole grains, low-fat dairy products, and lean protein foods contain the nutrients you need without too many calories. Decrease your intake of foods high in solid fats, added sugars, and salt. Eat the right amount of calories for you. Get information about a proper diet from your health care provider, if necessary.  Regular physical exercise is one of the most important things you can do for your health. Most adults should get at least 150 minutes of moderate-intensity exercise (any activity that increases your heart rate and causes you to sweat) each week. In addition, most adults need muscle-strengthening exercises on 2 or more days a  week.  Maintain a healthy weight. The body mass index (BMI) is a screening tool to identify possible weight problems. It provides an estimate of body fat based on height and weight. Your health care provider can find your BMI and can help you achieve or maintain a healthy weight. For adults 20 years and older:  A BMI below 18.5 is considered underweight.  A BMI of 18.5 to 24.9 is normal.  A BMI of 25 to 29.9 is considered overweight.  A BMI of 30 and above is considered obese.  Maintain normal blood lipids and cholesterol levels by exercising and minimizing your intake of saturated fat. Eat a balanced diet with plenty of fruit and vegetables. Blood tests for lipids and cholesterol should begin at age 72 and be repeated every 5 years. If your lipid or cholesterol levels are high, you are over 50, or you are at high risk for heart disease, you may need your cholesterol levels checked more frequently. Ongoing high lipid and cholesterol levels should be treated with medicines if diet and exercise are not working.  If you smoke, find out from your health care provider how to quit. If you do not use tobacco, do not start.  Lung cancer screening is recommended for adults aged 37-80 years who are at high risk for developing lung cancer because of a history of smoking. A yearly low-dose CT scan  of the lungs is recommended for people who have at least a 30-pack-year history of smoking and are a current smoker or have quit within the past 15 years. A pack year of smoking is smoking an average of 1 pack of cigarettes a day for 1 year (for example: 1 pack a day for 30 years or 2 packs a day for 15 years). Yearly screening should continue until the smoker has stopped smoking for at least 15 years. Yearly screening should be stopped for people who develop a health problem that would prevent them from having lung cancer treatment.  If you choose to drink alcohol, do not have more than 2 drinks per day. One drink  is considered to be 12 ounces (355 mL) of beer, 5 ounces (148 mL) of wine, or 1.5 ounces (44 mL) of liquor.  Avoid use of street drugs. Do not share needles with anyone. Ask for help if you need support or instructions about stopping the use of drugs.  High blood pressure causes heart disease and increases the risk of stroke. Your blood pressure should be checked at least every 1-2 years. Ongoing high blood pressure should be treated with medicines, if weight loss and exercise are not effective.  If you are 69-57 years old, ask your health care provider if you should take aspirin to prevent heart disease.  Diabetes screening involves taking a blood sample to check your fasting blood sugar level. Testing should be considered at a younger age or be carried out more frequently if you are overweight and have at least 1 risk factor for diabetes.  Colorectal cancer can be detected and often prevented. Most routine colorectal cancer screening begins at the age of 22 and continues through age 81. However, your health care provider may recommend screening at an earlier age if you have risk factors for colon cancer. On a yearly basis, your health care provider may provide home test kits to check for hidden blood in the stool. Use of a small camera at the end of a tube to directly examine the colon (sigmoidoscopy or colonoscopy) can detect the earliest forms of colorectal cancer. Talk to your health care provider about this at age 38, when routine screening begins. Direct exam of the colon should be repeated every 5-10 years through age 24, unless early forms of precancerous polyps or small growths are found.  Hepatitis C blood testing is recommended for all people born from [birth year] through 1964 and any individual with known risks for hepatitis C.  Screening for abdominal aortic aneurysm (AAA)  by ultrasound is recommended for people who have history of high blood pressure or who are current or former  smokers.  Healthy men should  receive prostate-specific antigen (PSA) blood tests as part of routine cancer screening. Talk with your health care provider about prostate cancer screening.  Testicular cancer screening is  recommended for adult males. Screening includes self-exam, a health care provider exam, and other screening tests. Consult with your health care provider about any symptoms you have or any concerns you have about testicular cancer.  Use sunscreen. Apply sunscreen liberally and repeatedly throughout the day. You should seek shade when your shadow is shorter than you. Protect yourself by wearing long sleeves, pants, a wide-brimmed hat, and sunglasses year round, whenever you are outdoors.  Once a month, do a whole-body skin exam, using a mirror to look at the skin on your back. Tell your health care provider about new moles, moles that have irregular  borders, moles that are larger than a pencil eraser, or moles that have changed in shape or color.  Stay current with required vaccines (immunizations).  Influenza vaccine. All adults should be immunized every year.  Tetanus, diphtheria, and acellular pertussis (Td, Tdap) vaccine. An adult who has not previously received Tdap or who does not know his vaccine status should receive 1 dose of Tdap. This initial dose should be followed by tetanus and diphtheria toxoids (Td) booster doses every 10 years. Adults with an unknown or incomplete history of completing a 3-dose immunization series with Td-containing vaccines should begin or complete a primary immunization series including a Tdap dose. Adults should receive a Td booster every 10 years.  Zoster vaccine. One dose is recommended for adults aged 39 years or older unless certain conditions are present.    PREVNAR - Pneumococcal 13-valent conjugate (PCV13) vaccine. When indicated, a person who is uncertain of his immunization history and has no record of immunization should receive the  PCV13 vaccine. An adult aged 1 years or older who has certain medical conditions and has not been previously immunized should receive 1 dose of PCV13 vaccine. This PCV13 should be followed with a dose of pneumococcal polysaccharide (PPSV23) vaccine. The PPSV23 vaccine dose should be obtained 1 or more year(s)after the dose of PCV13 vaccine. An adult aged 72 years or older who has certain medical conditions and previously received 1 or more doses of PPSV23 vaccine should receive 1 dose of PCV13. The PCV13 vaccine dose should be obtained 1 or more years after the last PPSV23 vaccine dose.    PNEUMOVAX - Pneumococcal polysaccharide (PPSV23) vaccine. When PCV13 is also indicated, PCV13 should be obtained first. All adults aged 19 years and older should be immunized. An adult younger than age 20 years who has certain medical conditions should be immunized. Any person who resides in a nursing home or long-term care facility should be immunized. An adult smoker should be immunized. People with an immunocompromised condition and certain other conditions should receive both PCV13 and PPSV23 vaccines. People with human immunodeficiency virus (HIV) infection should be immunized as soon as possible after diagnosis. Immunization during chemotherapy or radiation therapy should be avoided. Routine use of PPSV23 vaccine is not recommended for American Indians, [place] Natives, or people younger than 65 years unless there are medical conditions that require PPSV23 vaccine. When indicated, people who have unknown immunization and have no record of immunization should receive PPSV23 vaccine. One-time revaccination 5 years after the first dose of PPSV23 is recommended for people aged 19-64 years who have chronic kidney failure, nephrotic syndrome, asplenia, or immunocompromised conditions. People who received 1-2 doses of PPSV23 before age 64 years should receive another dose of PPSV23 vaccine at age 40 years or later if at least 5  years have passed since the previous dose. Doses of PPSV23 are not needed for people immunized with PPSV23 at or after age 1 years.    Hepatitis A vaccine. Adults who wish to be protected from this disease, have certain high-risk conditions, work with hepatitis A-infected animals, work in hepatitis A research labs, or travel to or work in countries with a high rate of hepatitis A should be immunized. Adults who were previously unvaccinated and who anticipate close contact with an international adoptee during the first 60 days after arrival in the [place] from a country with a high rate of hepatitis A should be immunized.    Hepatitis B vaccine. Adults should be immunized if they  wish to be protected from this disease, have certain high-risk conditions, may be exposed to blood or other infectious body fluids, are household contacts or sex partners of hepatitis B positive people, are clients or workers in certain care facilities, or travel to or work in countries with a high rate of hepatitis B.   Preventive Service / Frequency   Ages 14 and over  Blood pressure check.  Lipid and cholesterol check.  Lung cancer screening. / Every year if you are aged 43-80 years and have a 30-pack-year history of smoking and currently smoke or have quit within the past 15 years. Yearly screening is stopped once you have quit smoking for at least 15 years or develop a health problem that would prevent you from having lung cancer treatment.  Fecal occult blood test (FOBT) of stool. You may not have to do this test if you get a colonoscopy every 10 years.  Flexible sigmoidoscopy** or colonoscopy.** / Every 5 years for a flexible sigmoidoscopy or every 10 years for a colonoscopy beginning at age 34 and continuing until age 86.  Hepatitis C blood test.** / For all people born from [birth year] through 1964 and any individual with known risks for hepatitis C.  Abdominal aortic aneurysm (AAA) screening./ Screening  current or former smokers or have Hypertension.  Skin self-exam. / Monthly.  Influenza vaccine. / Every year.  Tetanus, diphtheria, and acellular pertussis (Tdap/Td) vaccine.** / 1 dose of Td every 10 years.   Zoster vaccine.** / 1 dose for adults aged 41 years or older.         Pneumococcal 13-valent conjugate (PCV13) vaccine.    Pneumococcal polysaccharide (PPSV23) vaccine.     Hepatitis A vaccine.** / Consult your health care provider.  Hepatitis B vaccine.** / Consult your health care provider. Screening for abdominal aortic aneurysm (AAA)  by ultrasound is recommended for people who have history of high blood pressure or who are current or former smokers. ++++++++++ Recommend Adult Low Dose Aspirin or  coated  Aspirin 81 mg daily  To reduce risk of Colon Cancer 20 %,  Skin Cancer 26 % ,  Malignant Melanoma 46%  and  Pancreatic cancer 60% ++++++++++++++++++++++ Vitamin D goal  is between 70-100.  Please make sure that you are taking your Vitamin D as directed.  It is very important as a natural anti-inflammatory  helping hair, skin, and nails, as well as reducing stroke and heart attack risk.  It helps your bones and helps with mood. It also decreases numerous cancer risks so please take it as directed.  Low Vit D is associated with a 200-300% higher risk for CANCER  and 200-300% higher risk for HEART   ATTACK  &  STROKE.   .....................................Marland Kitchen It is also associated with higher death rate at younger ages,  autoimmune diseases like Rheumatoid arthritis, Lupus, Multiple Sclerosis.    Also many other serious conditions, like depression, Alzheimer's Dementia, infertility, muscle aches, fatigue, fibromyalgia - just to name a few. ++++++++++++++++++++++ Recommend the book "The END of DIETING" by Dr Excell Seltzer  & the book "The END of DIABETES " by Dr Excell Seltzer At [email or web address] - get book & Audio CD's    Being diabetic has a  300% increased risk for  heart attack, stroke, cancer, and alzheimer- type vascular dementia. It is very important that you work harder with diet by avoiding all foods that are white. Avoid white rice (brown & wild rice is OK), white potatoes (sweetpotatoes in moderation  is OK), White bread or wheat bread or anything made out of white flour like bagels, donuts, rolls, buns, biscuits, cakes, pastries, cookies, pizza crust, and pasta (made from white flour & egg whites) - vegetarian pasta or spinach or wheat pasta is OK. Multigrain breads like Arnold's or Pepperidge [employer], or multigrain sandwich thins or flatbreads.  Diet, exercise and weight loss can reverse and cure diabetes in the early stages.  Diet, exercise and weight loss is very important in the control and prevention of complications of diabetes which affects every system in your body, ie. Brain - dementia/stroke, eyes - glaucoma/blindness, heart - heart attack/heart failure, kidneys - dialysis, stomach - gastric paralysis, intestines - malabsorption, nerves - severe painful neuritis, circulation - gangrene & loss of a leg(s), and finally cancer and Alzheimers.    I recommend avoid fried & greasy foods,  sweets/candy, white rice (brown or wild rice or Quinoa is OK), white potatoes (sweet potatoes are OK) - anything made from white flour - bagels, doughnuts, rolls, buns, biscuits,white and wheat breads, pizza crust and traditional pasta made of white flour & egg white(vegetarian pasta or spinach or wheat pasta is OK).  Multi-grain bread is OK - like multi-grain flat bread or sandwich thins. Avoid alcohol in excess. Exercise is also important.    Eat all the vegetables you want - avoid meat, especially red meat and dairy - especially cheese.  Cheese is the most concentrated form of trans-fats which is the worst thing to clog up our arteries. Veggie cheese is OK which can be found in the fresh produce section at [employer] or [employer] or  [employer]  ++++++++++++++++++++++ DASH Eating Plan  DASH stands for "Dietary Approaches to Stop Hypertension."   The DASH eating plan is a healthy eating plan that has been shown to reduce high blood pressure (hypertension). Additional health benefits may include reducing the risk of type 2 diabetes mellitus, heart disease, and stroke. The DASH eating plan may also help with weight loss. WHAT DO I NEED TO KNOW ABOUT THE DASH EATING PLAN? For the DASH eating plan, you will follow these general guidelines:  Choose foods with a percent daily value for sodium of less than 5% (as listed on the food label).  Use salt-free seasonings or herbs instead of table salt or sea salt.  Check with your health care provider or pharmacist before using salt substitutes.  Eat lower-sodium products, often labeled as "lower sodium" or "no salt added."  Eat fresh foods.  Eat more vegetables, fruits, and low-fat dairy products.  Choose whole grains. Look for the word "whole" as the first word in the ingredient list.  Choose fish   Limit sweets, desserts, sugars, and sugary drinks.  Choose heart-healthy fats.  Eat veggie cheese   Eat more home-cooked food and less restaurant, buffet, and fast food.  Limit fried foods.  Cook foods using methods other than frying.  Limit canned vegetables. If you do use them, rinse them well to decrease the sodium.  When eating at a restaurant, ask that your food be prepared with less salt, or no salt if possible.                      WHAT FOODS CAN I EAT? Read Dr Fara Olden Fuhrman's books on The End of Dieting & The End of Diabetes  Grains Whole grain or whole wheat bread. Brown rice. Whole grain or whole wheat pasta. Quinoa, bulgur, and whole grain cereals. Low-sodium cereals.  Corn or whole wheat flour tortillas. Whole grain cornbread. Whole grain crackers. Low-sodium crackers.  Vegetables Fresh or frozen vegetables (raw, steamed, roasted, or grilled). Low-sodium  or reduced-sodium tomato and vegetable juices. Low-sodium or reduced-sodium tomato sauce and paste. Low-sodium or reduced-sodium canned vegetables.   Fruits All fresh, canned (in natural juice), or frozen fruits.  Protein Products  All fish and seafood.  Dried beans, peas, or lentils. Unsalted nuts and seeds. Unsalted canned beans.  Dairy Low-fat dairy products, such as skim or 1% milk, 2% or reduced-fat cheeses, low-fat ricotta or cottage cheese, or plain low-fat yogurt. Low-sodium or reduced-sodium cheeses.  Fats and Oils Tub margarines without trans fats. Light or reduced-fat mayonnaise and salad dressings (reduced sodium). Avocado. Safflower, olive, or canola oils. Natural peanut or almond butter.  Other Unsalted popcorn and pretzels. The items listed above may not be a complete list of recommended foods or beverages. Contact your dietitian for more options.  ++++++++++++++++++++  WHAT FOODS ARE NOT RECOMMENDED? Grains/ White flour or wheat flour White bread. White pasta. White rice. Refined cornbread. Bagels and croissants. Crackers that contain trans fat.  Vegetables  Creamed or fried vegetables. Vegetables in a . Regular canned vegetables. Regular canned tomato sauce and paste. Regular tomato and vegetable juices.  Fruits Dried fruits. Canned fruit in light or heavy syrup. Fruit juice.  Meat and Other Protein Products Meat in general - RED meat & White meat.  Fatty cuts of meat. Ribs, chicken wings, all processed meats as bacon, sausage, bologna, salami, fatback, hot dogs, bratwurst and packaged luncheon meats.  Dairy Whole or 2% milk, cream, half-and-half, and cream cheese. Whole-fat or sweetened yogurt. Full-fat cheeses or blue cheese. Non-dairy creamers and whipped toppings. Processed cheese, cheese spreads, or cheese curds.  Condiments Onion and garlic salt, seasoned salt, table salt, and sea salt. Canned and packaged gravies. Worcestershire sauce. Tartar sauce.  Barbecue sauce. Teriyaki sauce. Soy sauce, including reduced sodium. Steak sauce. Fish sauce. Oyster sauce. Cocktail sauce. Horseradish. Ketchup and mustard. Meat flavorings and tenderizers. Bouillon cubes. Hot sauce. Tabasco sauce. Marinades. Taco seasonings. Relishes.  Fats and Oils Butter, stick margarine, lard, shortening and bacon fat. Coconut, palm kernel, or palm oils. Regular salad dressings.  Pickles and olives. Salted popcorn and pretzels.  The items listed above may not be a complete list of foods and beverages to avoid.

## 2018-04-07 ENCOUNTER — Telehealth: Payer: Self-pay | Admitting: Adult Health Nurse Practitioner

## 2018-04-07 LAB — LIPID PANEL
Cholesterol: 134 mg/dL (ref <200)
HDL: 37 mg/dL — ABNORMAL LOW (ref >=40)
LDL Cholesterol (Calc): 75 mg/dL (calc)
Non-HDL Cholesterol (Calc): 97 mg/dL (calc) (ref <130)
Total CHOL/HDL Ratio: 3.6 (calc) (ref <5.0)
Triglycerides: 136 mg/dL (ref <150)

## 2018-04-07 LAB — URINALYSIS W MICROSCOPIC + REFLEX CULTURE
Bacteria, UA: NONE SEEN /HPF
Bilirubin Urine: NEGATIVE
Glucose, UA: NEGATIVE
Hgb urine dipstick: NEGATIVE
Hyaline Cast: NONE SEEN /LPF
Ketones, ur: NEGATIVE
Nitrites, Initial: NEGATIVE
Protein, ur: NEGATIVE
RBC / HPF: NONE SEEN /HPF (ref 0–2)
Specific Gravity, Urine: 1.014 (ref 1.001–1.03)
Squamous Epithelial / HPF: NONE SEEN /HPF (ref <=5)
pH: 6.5 (ref 5.0–8.0)

## 2018-04-07 LAB — COMPLETE METABOLIC PANEL WITHOUT GFR
AG Ratio: 1.9 (calc) (ref 1.0–2.5)
ALT: 16 U/L (ref 9–46)
AST: 23 U/L (ref 10–35)
Chloride: 107 mmol/L (ref 98–110)
GFR, Est Non African American: 93 mL/min/1.73m2 (ref >=60)
Globulin: 2.2 g/dL (ref 1.9–3.7)
Potassium: 3.9 mmol/L (ref 3.5–5.3)
Sodium: 140 mmol/L (ref 135–146)
Total Bilirubin: 0.6 mg/dL (ref 0.2–1.2)

## 2018-04-07 LAB — CBC WITH DIFFERENTIAL/PLATELET
Absolute Monocytes: 489 {cells}/uL (ref 200–950)
Basophils Absolute: 52 cells/uL (ref 0–200)
Basophils Relative: 1 %
Eosinophils Absolute: 229 cells/uL (ref 15–500)
Eosinophils Relative: 4.4 %
HCT: 43 % (ref 38.5–50.0)
Hemoglobin: 14.9 g/dL (ref 13.2–17.1)
Lymphs Abs: 2361 cells/uL (ref 850–3900)
MCH: 30.6 pg (ref 27.0–33.0)
MCHC: 34.7 g/dL (ref 32.0–36.0)
MCV: 88.3 fL (ref 80.0–100.0)
MPV: 11.8 fL (ref 7.5–12.5)
Monocytes Relative: 9.4 %
Neutro Abs: 2070 {cells}/uL (ref 1500–7800)
Neutrophils Relative %: 39.8 %
Platelets: 122 Thousand/uL — ABNORMAL LOW (ref 140–400)
RBC: 4.87 Million/uL (ref 4.20–5.80)
RDW: 13.8 % (ref 11.0–15.0)
Total Lymphocyte: 45.4 %
WBC: 5.2 Thousand/uL (ref 3.8–10.8)

## 2018-04-07 LAB — MICROALBUMIN / CREATININE URINE RATIO
Creatinine, Urine: 88 mg/dL (ref 20–320)
Microalb Creat Ratio: 3 mcg/mg creat (ref <30)
Microalb, Ur: 0.3 mg/dL

## 2018-04-07 LAB — COMPLETE METABOLIC PANEL WITH GFR
Albumin: 4.2 g/dL (ref 3.6–5.1)
Alkaline phosphatase (APISO): 60 U/L (ref 35–144)
BUN: 16 mg/dL (ref 7–25)
CO2: 26 mmol/L (ref 20–32)
Calcium: 9.5 mg/dL (ref 8.6–10.3)
Creat: 0.85 mg/dL (ref 0.70–1.25)
GFR, Est African American: 108 mL/min/{1.73_m2} (ref >=60)
Glucose, Bld: 103 mg/dL — ABNORMAL HIGH (ref 65–99)
Total Protein: 6.4 g/dL (ref 6.1–8.1)

## 2018-04-07 LAB — CULTURE INDICATED

## 2018-04-07 LAB — URINE CULTURE
MICRO NUMBER:: 185264
Result:: NO GROWTH
SPECIMEN QUALITY:: ADEQUATE

## 2018-04-07 LAB — HEMOGLOBIN A1C
Hgb A1c MFr Bld: 5.8 % of total Hgb — ABNORMAL HIGH (ref <5.7)
Mean Plasma Glucose: 120 (calc)
eAG (mmol/L): 6.6 (calc)

## 2018-04-07 LAB — TSH: TSH: 1.42 mIU/L (ref 0.40–4.50)

## 2018-04-07 LAB — VITAMIN D 25 HYDROXY (VIT D DEFICIENCY, FRACTURES): Vit D, 25-Hydroxy: 38 ng/mL (ref 30–100)

## 2018-04-07 LAB — MAGNESIUM: Magnesium: 1.8 mg/dL (ref 1.5–2.5)

## 2018-04-07 LAB — VITAMIN B12: Vitamin B-12: 750 pg/mL (ref 200–1100)

## 2018-04-07 LAB — INSULIN, RANDOM: Insulin: 19.3 u[IU]/mL (ref 2.0–19.6)

## 2018-04-07 LAB — C. TRACHOMATIS/N. GONORRHOEAE RNA
C. trachomatis RNA, TMA: NOT DETECTED
N. gonorrhoeae RNA, TMA: NOT DETECTED

## 2018-04-07 LAB — PSA: PSA: 1.6 ng/mL (ref <=4.0)

## 2018-04-07 NOTE — Telephone Encounter (Signed)
What is the status of his viagra renewal? [facility] in [place] and they have not received the rx. Please advise patient.

## 2018-04-08 NOTE — Telephone Encounter (Signed)
This has been taken care Mrs. Caleb Johns Dr. Melford Aase had me refill this med for 30 days with no refills. Once it was signed by provider I mailed this Rx to patient because we do not send this to the [place] the patient has been doing this. This was taken care the same day of visit.

## 2018-04-29 ENCOUNTER — Ambulatory Visit: Payer: BLUE CROSS/BLUE SHIELD | Admitting: Neurology

## 2018-06-01 ENCOUNTER — Other Ambulatory Visit: Payer: Self-pay | Admitting: Internal Medicine

## 2018-06-01 DIAGNOSIS — N529 Male erectile dysfunction, unspecified: Secondary | ICD-10-CM

## 2018-06-01 MED ORDER — SILDENAFIL CITRATE 100 MG PO TABS: ORAL_TABLET | ORAL | 0 refills | Status: DC

## 2018-06-19 ENCOUNTER — Other Ambulatory Visit: Payer: Self-pay | Admitting: Internal Medicine

## 2018-06-19 DIAGNOSIS — N529 Male erectile dysfunction, unspecified: Secondary | ICD-10-CM

## 2018-07-07 ENCOUNTER — Ambulatory Visit: Payer: Self-pay | Admitting: Adult Health

## 2018-07-08 NOTE — Progress Notes (Signed)
FOLLOW UP  Assessment and Plan:   Hypertension Well controlled with current medications  Monitor blood pressure at home; patient to call if consistently greater than 130/80 Continue DASH diet.   Reminder to go to the ER if any CP, SOB, nausea, dizziness, severe HA, changes vision/speech, left arm numbness and tingling and jaw pain.  Cholesterol At goal; continue pravastatin  Continue low cholesterol diet and exercise.  Check lipid panel.   Prediabetes Discussed disease and risks Discussed diet/exercise, weight management  A1C  Overweight Long discussion about weight loss, diet, and exercise Recommended diet heavy in fruits and veggies and low in animal meats, cheeses, and dairy products, appropriate calorie intake Discussed appropriate weight for height  Follow up at next visit  Vitamin D Def/ osteoporosis prevention Below goal at recent check; he did not increase dose - advised to double to 4000 IU daily  continue to recommend supplementation for goal of 60-100 Defer vitamin D level to next OV  Gout Continue allopurinol Diet discussed Check uric acid as needed  Continue diet and meds as discussed. Further disposition pending results of labs. Discussed med's effects and SE's.   Over 30 minutes of exam, counseling, chart review, and critical decision making was performed.   Future Appointments  Date Time Provider [place]  04/11/2019  3:00 PM Vicie Mutters, PA-C [id number] None    ----------------------------------------------------------------------------------------------------------------------  HPI 63 y.o. very pleasant and friendly AA male  presents for 3 month follow up on hypertension, cholesterol, prediabetes, weight and vitamin D deficiency.   BMI is Body mass index is 29.07 kg/m., he has been working on diet, doesn't intentionally exercise but reports he is active throughout the day - increasing fruit intake, greens from his garden, watching  portions and cutting junk food.  Wt Readings from Last 3 Encounters:  07/11/18 194 lb (88 kg)  04/05/18 211 lb 9.6 oz (96 kg)  11/08/17 195 lb 4 oz (88.6 kg)   His blood pressure has been controlled at home, today their BP is BP: 112/72  He does not workout. He denies chest pain, shortness of breath, dizziness.   He is on cholesterol medication (pravastatin 40 mg daily) and denies myalgias. His cholesterol is at goal. The cholesterol last visit was:   Lab Results  Component Value Date   CHOL 134 04/05/2018   HDL 37 (L) 04/05/2018   LDLCALC 75 04/05/2018   TRIG 136 04/05/2018   CHOLHDL 3.6 04/05/2018    He has been working on diet and exercise for prediabetes, and denies nausea, paresthesia of the feet, polydipsia, polyuria, visual disturbances, vomiting and weight loss. Last A1C in the office was:  Lab Results  Component Value Date   HGBA1C 5.8 (H) 04/05/2018   Patient is on Vitamin D supplement (taking 2000 IU, didn't increase after last visit) remained well below goal of 60-100 at recent check:  Lab Results  Component Value Date   VD25OH 38 04/05/2018     Patient is on allopurinol for gout and does not report a recent flare.  Lab Results  Component Value Date   LABURIC 4.1 09/24/2016      Current Medications:  Current Outpatient Medications on File Prior to Visit  Medication Sig  . acyclovir (ZOVIRAX) 800 MG tablet TAKE 1 TABLET BY MOUTH ONCE DAILY  . allopurinol (ZYLOPRIM) 300 MG tablet TAKE 1 TABLET BY MOUTH ONCE DAILY FOR GOUT  . alum & mag hydroxide-simeth (MAALOX/MYLANTA) 200-200-20 MG/5ML suspension Take 30 mLs by mouth 2 (two) times  daily.  . aspirin EC 81 MG tablet Take 81 mg by mouth daily.  Marland Kitchen atenolol (TENORMIN) 100 MG tablet TAKE 1 TABLET BY MOUTH ONCE DAILY FOR BLOOD PRESSURE  . B Complex-C (SUPER B COMPLEX PO) Take 1 tablet by mouth daily.  . Cholecalciferol (VITAMIN D) 2000 UNITS CAPS Take 2,000 Units by mouth daily.  . Flaxseed, Linseed, (FLAX SEED OIL)  1000 MG CAPS Take 1,000 mg by mouth daily.  . fluticasone (FLONASE) 50 MCG/ACT nasal spray Place 2 sprays into both nostrils daily.  Marland Kitchen glucose blood (FREESTYLE TEST STRIPS) test strip Use as instructed, DX 250.00  . hydrochlorothiazide (HYDRODIURIL) 25 MG tablet Take 1 tablet (25 mg total) by mouth daily.  . Lancets (FREESTYLE) lancets   . loratadine (EQ ALLERGY RELIEF) 10 MG tablet TAKE 1 TABLET BY MOUTH DAILY FOR ALLERGIES  . Omega-3 Fatty Acids (FISH OIL) 1000 MG CAPS Take 1,000 mg by mouth daily.  . pravastatin (PRAVACHOL) 40 MG tablet TAKE 1 TABLET BY MOUTH ONCE DAILY  . sildenafil (VIAGRA) 100 MG tablet TAKE ONE-HALF TO ONE TABLET BY MOUTH DAILY AS NEEDED  . tamsulosin (FLOMAX) 0.4 MG CAPS capsule Take 1 capsule (0.4 mg total) by mouth at bedtime.   No current facility-administered medications on file prior to visit.      Allergies:  Allergies  Allergen Reactions  . Ace Inhibitors     REACTION: cough  . Trilipix [Choline Fenofibrate] Other (See Comments)    Heart burn     Medical History:  Past Medical History:  Diagnosis Date  . GERD (gastroesophageal reflux disease)   . Gout   . HSV-1 (herpes simplex virus 1) infection   . HSV-2 (herpes simplex virus 2) infection   . Hyperlipidemia   . Hypertension   . Hypogonadism male   . Obese   . Pneumonia    walking -many years ago  . Pre-diabetes    Family history- Reviewed and unchanged Social history- Reviewed and unchanged   Review of Systems:  Review of Systems  Constitutional: Negative for malaise/fatigue and weight loss.  HENT: Negative for hearing loss and tinnitus.   Eyes: Negative for blurred vision and double vision.  Respiratory: Negative for cough, shortness of breath and wheezing.   Cardiovascular: Negative for chest pain, palpitations, orthopnea, claudication and leg swelling.  Gastrointestinal: Negative for abdominal pain, blood in stool, constipation, diarrhea, heartburn, melena, nausea and vomiting.   Genitourinary: Negative.   Musculoskeletal: Negative for joint pain and myalgias.  Skin: Negative for rash.  Neurological: Negative for dizziness, tingling, sensory change, weakness and headaches.  Endo/Heme/Allergies: Negative for polydipsia.  Psychiatric/Behavioral: Negative.   All other systems reviewed and are negative.   Physical Exam: BP 112/72   Pulse (!) 59   Temp (!) 97.5 F (36.4 C)   Wt 194 lb (88 kg)   SpO2 99%   BMI 29.07 kg/m  Wt Readings from Last 3 Encounters:  07/11/18 194 lb (88 kg)  04/05/18 211 lb 9.6 oz (96 kg)  11/08/17 195 lb 4 oz (88.6 kg)   General Appearance: Well nourished, in no apparent distress. Eyes: PERRLA, EOMs, conjunctiva no swelling or erythema Sinuses: No Frontal/maxillary tenderness ENT/Mouth: Ext aud canals clear, TMs without erythema, bulging. No erythema, swelling, or exudate on post pharynx.  Tonsils not swollen or erythematous. Hearing normal.  Neck: Supple, thyroid normal.  Respiratory: Respiratory effort normal, BS equal bilaterally without rales, rhonchi, wheezing or stridor.  Cardio: RRR with no MRGs. Brisk peripheral pulses without edema.  Abdomen: Soft, + BS.  Non tender, no guarding, rebound, hernias, masses. Lymphatics: Non tender without lymphadenopathy.  Musculoskeletal: Full ROM, 5/5 strength, Normal gait Skin: Warm, dry without rashes, lesions, ecchymosis.  Neuro: Cranial nerves intact. No cerebellar symptoms.  Psych: Awake and oriented X 3, normal affect, Insight and Judgment appropriate.    Izora Ribas, NP 4:50 PM [facility]

## 2018-07-11 ENCOUNTER — Other Ambulatory Visit: Payer: Self-pay

## 2018-07-11 ENCOUNTER — Encounter: Payer: Self-pay | Admitting: Adult Health

## 2018-07-11 ENCOUNTER — Ambulatory Visit: Payer: BLUE CROSS/BLUE SHIELD | Admitting: Adult Health

## 2018-07-11 VITALS — BP 112/72 | HR 59 | Temp 97.5°F | Wt 194.0 lb

## 2018-07-11 DIAGNOSIS — R7309 Other abnormal glucose: Secondary | ICD-10-CM

## 2018-07-11 DIAGNOSIS — E782 Mixed hyperlipidemia: Secondary | ICD-10-CM | POA: Diagnosis not present

## 2018-07-11 DIAGNOSIS — M1A00X0 Idiopathic chronic gout, unspecified site, without tophus (tophi): Secondary | ICD-10-CM

## 2018-07-11 DIAGNOSIS — Z79899 Other long term (current) drug therapy: Secondary | ICD-10-CM | POA: Diagnosis not present

## 2018-07-11 DIAGNOSIS — I10 Essential (primary) hypertension: Secondary | ICD-10-CM

## 2018-07-11 DIAGNOSIS — E559 Vitamin D deficiency, unspecified: Secondary | ICD-10-CM

## 2018-07-11 DIAGNOSIS — E663 Overweight: Secondary | ICD-10-CM

## 2018-07-11 DIAGNOSIS — K219 Gastro-esophageal reflux disease without esophagitis: Secondary | ICD-10-CM

## 2018-07-11 NOTE — Patient Instructions (Addendum)
Goals    . HEMOGLOBIN A1C < 5.7    . Weight (lb) < 190 lb (86.2 kg) (pt-stated)      Know what a healthy weight is for you (roughly BMI <25) and aim to maintain this  Aim for 7+ servings of fruits and vegetables daily  65-80+ fluid ounces of water or unsweet tea for healthy kidneys  Limit to max 1 drink of alcohol per day; avoid smoking/tobacco  Limit animal fats in diet for cholesterol and heart health - choose grass fed whenever available  Avoid highly processed foods, and foods high in saturated/trans fats  Aim for low stress - take time to unwind and care for your mental health  Aim for 150 min of moderate intensity exercise weekly for heart health, and weights twice weekly for bone health  Aim for 7-9 hours of sleep daily        Bad carbs also include fruit juice, alcohol, and sweet tea. These are empty calories that do not signal to your brain that you are full.   Please remember the good carbs are still carbs which convert into sugar. So please measure them out no more than 1/2-1 cup of rice, oatmeal, pasta, and beans  Veggies are however free foods! Pile them on.   Not all fruit is created equal. Please see the list below, the fruit at the bottom is higher in sugars than the fruit at the top. Please avoid all dried fruits.

## 2018-07-12 ENCOUNTER — Other Ambulatory Visit: Payer: Self-pay | Admitting: Adult Health

## 2018-07-12 DIAGNOSIS — E876 Hypokalemia: Secondary | ICD-10-CM

## 2018-07-12 LAB — COMPLETE METABOLIC PANEL WITH GFR
AG Ratio: 2 (calc) (ref 1.0–2.5)
ALT: 10 U/L (ref 9–46)
AST: 21 U/L (ref 10–35)
Albumin: 4.5 g/dL (ref 3.6–5.1)
Alkaline phosphatase (APISO): 64 U/L (ref 35–144)
BUN: 23 mg/dL (ref 7–25)
CO2: 30 mmol/L (ref 20–32)
Calcium: 9.7 mg/dL (ref 8.6–10.3)
Chloride: 101 mmol/L (ref 98–110)
Creat: 0.98 mg/dL (ref 0.70–1.25)
GFR, Est African American: 95 mL/min/{1.73_m2} (ref >=60)
GFR, Est Non African American: 82 mL/min/{1.73_m2} (ref >=60)
Globulin: 2.3 g/dL (calc) (ref 1.9–3.7)
Glucose, Bld: 105 mg/dL — ABNORMAL HIGH (ref 65–99)
Potassium: 3.3 mmol/L — ABNORMAL LOW (ref 3.5–5.3)
Sodium: 139 mmol/L (ref 135–146)
Total Bilirubin: 0.6 mg/dL (ref 0.2–1.2)
Total Protein: 6.8 g/dL (ref 6.1–8.1)

## 2018-07-12 LAB — MAGNESIUM: Magnesium: 2 mg/dL (ref 1.5–2.5)

## 2018-07-12 LAB — CBC WITH DIFFERENTIAL/PLATELET
Absolute Monocytes: 583 cells/uL (ref 200–950)
Basophils Absolute: 49 cells/uL (ref 0–200)
Basophils Relative: 0.9 %
Eosinophils Absolute: 281 cells/uL (ref 15–500)
Eosinophils Relative: 5.2 %
HCT: 44.8 % (ref 38.5–50.0)
Hemoglobin: 15.3 g/dL (ref 13.2–17.1)
Lymphs Abs: 2376 cells/uL (ref 850–3900)
MCH: 31.2 pg (ref 27.0–33.0)
MCHC: 34.2 g/dL (ref 32.0–36.0)
MCV: 91.4 fL (ref 80.0–100.0)
MPV: 11.7 fL (ref 7.5–12.5)
Monocytes Relative: 10.8 %
Neutro Abs: 2111 cells/uL (ref 1500–7800)
Neutrophils Relative %: 39.1 %
Platelets: 149 10*3/uL (ref 140–400)
RBC: 4.9 10*6/uL (ref 4.20–5.80)
RDW: 13.1 % (ref 11.0–15.0)
Total Lymphocyte: 44 %
WBC: 5.4 10*3/uL (ref 3.8–10.8)

## 2018-07-12 LAB — LIPID PANEL
Cholesterol: 147 mg/dL (ref <200)
HDL: 36 mg/dL — ABNORMAL LOW (ref >=40)
LDL Cholesterol (Calc): 84 mg/dL (calc)
Non-HDL Cholesterol (Calc): 111 mg/dL (calc) (ref <130)
Total CHOL/HDL Ratio: 4.1 (calc) (ref <5.0)
Triglycerides: 178 mg/dL — ABNORMAL HIGH (ref <150)

## 2018-07-12 LAB — HEMOGLOBIN A1C
Hgb A1c MFr Bld: 5.5 % of total Hgb (ref <5.7)
Mean Plasma Glucose: 111 (calc)
eAG (mmol/L): 6.2 (calc)

## 2018-07-12 LAB — TSH: TSH: 1.24 mIU/L (ref 0.40–4.50)

## 2018-07-20 ENCOUNTER — Other Ambulatory Visit: Payer: BC Managed Care – PPO

## 2018-07-20 ENCOUNTER — Other Ambulatory Visit: Payer: Self-pay

## 2018-07-20 DIAGNOSIS — E876 Hypokalemia: Secondary | ICD-10-CM

## 2018-07-20 DIAGNOSIS — E782 Mixed hyperlipidemia: Secondary | ICD-10-CM | POA: Diagnosis not present

## 2018-07-21 LAB — BASIC METABOLIC PANEL WITH GFR
BUN: 17 mg/dL (ref 7–25)
CO2: 31 mmol/L (ref 20–32)
Calcium: 9.7 mg/dL (ref 8.6–10.3)
Chloride: 101 mmol/L (ref 98–110)
Creat: 0.99 mg/dL (ref 0.70–1.25)
GFR, Est African American: 94 mL/min/{1.73_m2} (ref >=60)
GFR, Est Non African American: 81 mL/min/{1.73_m2} (ref >=60)
Glucose, Bld: 92 mg/dL (ref 65–99)
Potassium: 3.6 mmol/L (ref 3.5–5.3)
Sodium: 140 mmol/L (ref 135–146)

## 2018-07-21 NOTE — Progress Notes (Signed)
Patient has been made aware of his lab results. States that he forgot to take his blood pressure medication 2 days prior to his visit with you in the office. He says that he likes the salt substitute and will continue doing so per your recommendation. Marcelino Scot.

## 2018-08-02 ENCOUNTER — Other Ambulatory Visit: Payer: Self-pay | Admitting: Internal Medicine

## 2018-08-02 DIAGNOSIS — N529 Male erectile dysfunction, unspecified: Secondary | ICD-10-CM

## 2018-08-02 MED ORDER — SILDENAFIL CITRATE 100 MG PO TABS: ORAL_TABLET | ORAL | 0 refills | Status: DC

## 2018-08-10 ENCOUNTER — Other Ambulatory Visit: Payer: Self-pay | Admitting: Adult Health

## 2018-09-19 ENCOUNTER — Other Ambulatory Visit: Payer: Self-pay | Admitting: Internal Medicine

## 2018-09-19 DIAGNOSIS — N529 Male erectile dysfunction, unspecified: Secondary | ICD-10-CM

## 2018-09-19 MED ORDER — SILDENAFIL CITRATE 100 MG PO TABS: ORAL_TABLET | ORAL | 0 refills | Status: DC

## 2018-10-06 ENCOUNTER — Other Ambulatory Visit: Payer: Self-pay | Admitting: Internal Medicine

## 2018-10-06 DIAGNOSIS — I10 Essential (primary) hypertension: Secondary | ICD-10-CM

## 2018-10-16 ENCOUNTER — Encounter: Payer: Self-pay | Admitting: Internal Medicine

## 2018-10-16 NOTE — Progress Notes (Signed)
History of Present Illness:      This very nice 63 y.o. DBM presents for 6 month follow up with HTN, HLD, Pre-Diabetes and Vitamin D Deficiency. He also has hx/o Gout controlled on his meds.       Patient has treated for HTN circa 2004  & BP has been controlled at home. Today's BP is at goal - 116/72. Patient has had no complaints of any cardiac type chest pain, palpitations, dyspnea / orthopnea / PND, dizziness, claudication, or dependent edema.      Hyperlipidemia is controlled with diet & meds. Patient denies myalgias or other med SE's. Last Lipids were at goal albeit elevated Trig's: Lab Results  Component Value Date   CHOL 147 07/11/2018   HDL 36 (L) 07/11/2018   LDLCALC 84 07/11/2018   TRIG 178 (H) 07/11/2018   CHOLHDL 4.1 07/11/2018       Also, the patient has history of PreDiabetes  (A1c 6.4% / 2009 and A1c 5.9% / 2016) and has had no symptoms of reactive hypoglycemia, diabetic polys, paresthesias or visual blurring.  Last A1c was Normal & at goal: Lab Results  Component Value Date   HGBA1C 5.5 07/11/2018       Patient has hx/o Low Testosterone.       Further, the patient also has history of Vitamin D Deficiency ("21" / 2008) and supplements vitamin D sporadically.  Last vitamin D was still very low: Lab Results  Component Value Date   VD25OH 38 04/05/2018   Current Outpatient Medications on File Prior to Visit  Medication Sig  . acyclovir (ZOVIRAX) 800 MG tablet TAKE 1 TABLET BY MOUTH ONCE DAILY  . allopurinol (ZYLOPRIM) 300 MG tablet TAKE 1 TABLET BY MOUTH ONCE DAILY FOR GOUT  . alum & mag hydroxide-simeth (MAALOX/MYLANTA) 200-200-20 MG/5ML suspension Take 30 mLs by mouth 2 (two) times daily.  Marland Kitchen aspirin EC 81 MG tablet Take 81 mg by mouth daily.  Marland Kitchen atenolol (TENORMIN) 100 MG tablet Take 1 tablet Daily for BP  . B Complex-C (SUPER B COMPLEX PO) Take 1 tablet by mouth daily.  . Cholecalciferol (VITAMIN D) 2000 UNITS CAPS Take 4,000 Units by mouth daily.  .  Flaxseed, Linseed, (FLAX SEED OIL) 1000 MG CAPS Take 1,000 mg by mouth daily.  . fluticasone (FLONASE) 50 MCG/ACT nasal spray Place 2 sprays into both nostrils daily.  Marland Kitchen glucose blood (FREESTYLE TEST STRIPS) test strip Use as instructed, DX 250.00  . hydrochlorothiazide (HYDRODIURIL) 25 MG tablet Take 1 tablet every morning for BP & Fluid Retention  . Lancets (FREESTYLE) lancets   . loratadine (EQ ALLERGY RELIEF) 10 MG tablet TAKE 1 TABLET BY MOUTH DAILY FOR ALLERGIES  . Omega-3 Fatty Acids (FISH OIL) 1000 MG CAPS Take 1,000 mg by mouth daily.  . pravastatin (PRAVACHOL) 40 MG tablet TAKE 1 TABLET BY MOUTH ONCE DAILY  . sildenafil (VIAGRA) 100 MG tablet Take 1/2 to 1 tablet / day if needed for XXXX  . tamsulosin (FLOMAX) 0.4 MG CAPS capsule Take 1 capsule (0.4 mg total) by mouth at bedtime.   No current facility-administered medications on file prior to visit.    Allergies  Allergen Reactions  . Ace Inhibitors     REACTION: cough  . Trilipix [Choline Fenofibrate] Other (See Comments)    Heart burn   PMHx:   Past Medical History:  Diagnosis Date  . GERD (gastroesophageal reflux disease)   . Gout   . HSV-1 (herpes simplex virus  1) infection   . HSV-2 (herpes simplex virus 2) infection   . Hyperlipidemia   . Hypertension   . Hypogonadism male   . Obese   . Pneumonia    walking -many years ago  . Pre-diabetes    Immunization History  Administered Date(s) Administered  . PPD Test 02/13/2014  . Pneumococcal Polysaccharide-23 02/23/2002  . Tdap 05/15/2009   Past Surgical History:  Procedure Laterality Date  . BACK SURGERY  2012   on back-hernia  . HERNIA REPAIR    . INSERTION OF MESH N/A 08/15/2013   Procedure: INSERTION OF MESH;  Surgeon: Odis Hollingshead, MD;  Location: [facility];  Service: General;  Laterality: N/A;  . LAPAROSCOPIC INGUINAL HERNIA WITH UMBILICAL HERNIA Right [id number]   Procedure: LAPAROSCOPIC RIGH  INGUINAL HERNIA WITH OPEN UMBILICAL HERNIA;  Surgeon: Odis Hollingshead, MD;  Location: [facility];  Service: General;  Laterality: Right;  umbilical repair with mesh   FHx:    Reviewed / unchanged  SHx:    Reviewed / unchanged   Systems Review:  Constitutional: Denies fever, chills, wt changes, headaches, insomnia, fatigue, night sweats, change in appetite. Eyes: Denies redness, blurred vision, diplopia, discharge, itchy, watery eyes.  ENT: Denies discharge, congestion, post nasal drip, epistaxis, sore throat, earache, hearing loss, dental pain, tinnitus, vertigo, sinus pain, snoring.  CV: Denies chest pain, palpitations, irregular heartbeat, syncope, dyspnea, diaphoresis, orthopnea, PND, claudication or edema. Respiratory: denies cough, dyspnea, DOE, pleurisy, hoarseness, laryngitis, wheezing.  Gastrointestinal: Denies dysphagia, odynophagia, heartburn, reflux, water brash, abdominal pain or cramps, nausea, vomiting, bloating, diarrhea, constipation, hematemesis, melena, hematochezia  or hemorrhoids. Genitourinary: Denies dysuria, frequency, urgency, nocturia, hesitancy, discharge, hematuria or flank pain. Musculoskeletal: Denies arthralgias, myalgias, stiffness, jt. swelling, pain, limping or strain/sprain.  Skin: Denies pruritus, rash, hives, warts, acne, eczema or change in skin lesion(s). Neuro: No weakness, tremor, incoordination, spasms, paresthesia or pain. Psychiatric: Denies confusion, memory loss or sensory loss. Endo: Denies change in weight, skin or hair change.  Heme/Lymph: No excessive bleeding, bruising or enlarged lymph nodes.  Physical Exam  BP 116/72   Pulse 60   Temp (!) 97 F (36.1 C)   Resp 16   Ht 5\' 8"  (1.727 m)   Wt 191 lb 3.2 oz (86.7 kg)   BMI 29.07 kg/m   Appears  well nourished, well groomed  and in no distress.  Eyes: PERRLA, EOMs, conjunctiva no swelling or erythema. Sinuses: No frontal/maxillary tenderness ENT/Mouth: EAC's clear, TM's nl w/o erythema, bulging. Nares clear w/o erythema, swelling, exudates.  Oropharynx clear without erythema or exudates. Oral hygiene is good. Tongue normal, non obstructing. Hearing intact.  Neck: Supple. Thyroid not palpable. Car 2+/2+ without bruits, nodes or JVD. Chest: Respirations nl with BS clear & equal w/o rales, rhonchi, wheezing or stridor.  Cor: Heart sounds normal w/ regular rate and rhythm without sig. murmurs, gallops, clicks or rubs. Peripheral pulses normal and equal  without edema.  Abdomen: Soft & bowel sounds normal. Non-tender w/o guarding, rebound, hernias, masses or organomegaly.  Lymphatics: Unremarkable.  Musculoskeletal: Full ROM all peripheral extremities, joint stability, 5/5 strength and normal gait.  Skin: Warm, dry without exposed rashes, lesions or ecchymosis apparent.  Neuro: Cranial nerves intact, reflexes equal bilaterally. Sensory-motor testing grossly intact. Tendon reflexes grossly intact.  Pysch: Alert & oriented x 3.  Insight and judgement nl & appropriate. No ideations.  Assessment and Plan:  1. Essential hypertension  - Continue medication, monitor blood pressure at home.  - Continue DASH  diet.  Reminder to go to the ER if any CP,  SOB, nausea, dizziness, severe HA, changes vision/speech.  - CBC with Differential/Platelet - COMPLETE METABOLIC PANEL WITH GFR - Magnesium - TSH  2. Hyperlipidemia, mixed  - Continue diet/meds, exercise,& lifestyle modifications.  - Continue monitor periodic cholesterol/liver & renal functions   - Lipid panel - TSH  3. Vitamin D deficiency  - Continue supplementation.  - VITAMIN D 25 Hydroxyl  4. Abnormal glucose  - Continue diet, exercise  - Lifestyle modifications.  - Monitor appropriate labs.  - Fructosamine - Insulin, random  5. Idiopathic chronic gout  - Uric acid  6. Medication management  - CBC with Differential/Platelet - COMPLETE METABOLIC PANEL WITH GFR - Magnesium - Lipid panel - TSH - Fructosamine - Insulin, random - VITAMIN D 25 Hydroxyl - Uric  acid        Discussed  regular exercise, BP monitoring, weight control to achieve/maintain BMI less than 25 and discussed med and SE's. Recommended labs to assess and monitor clinical status with further disposition pending results of labs.  I discussed the assessment and treatment plan with the patient. The patient was provided an opportunity to ask questions and all were answered. The patient agreed with the plan and demonstrated an understanding of the instructions.  I provided over 30 minutes of exam, counseling, chart review and  complex critical decision making.  Kirtland Bouchard, MD

## 2018-10-16 NOTE — Patient Instructions (Signed)

## 2018-10-17 ENCOUNTER — Ambulatory Visit: Payer: BLUE CROSS/BLUE SHIELD | Admitting: Internal Medicine

## 2018-10-17 ENCOUNTER — Other Ambulatory Visit: Payer: Self-pay

## 2018-10-17 VITALS — BP 116/72 | HR 60 | Temp 97.0°F | Resp 16 | Ht 68.0 in | Wt 191.2 lb

## 2018-10-17 DIAGNOSIS — E559 Vitamin D deficiency, unspecified: Secondary | ICD-10-CM

## 2018-10-17 DIAGNOSIS — I10 Essential (primary) hypertension: Secondary | ICD-10-CM | POA: Diagnosis not present

## 2018-10-17 DIAGNOSIS — Z79899 Other long term (current) drug therapy: Secondary | ICD-10-CM

## 2018-10-17 DIAGNOSIS — M1A00X0 Idiopathic chronic gout, unspecified site, without tophus (tophi): Secondary | ICD-10-CM

## 2018-10-17 DIAGNOSIS — R7309 Other abnormal glucose: Secondary | ICD-10-CM | POA: Diagnosis not present

## 2018-10-17 DIAGNOSIS — E782 Mixed hyperlipidemia: Secondary | ICD-10-CM

## 2018-10-19 LAB — CBC WITH DIFFERENTIAL/PLATELET
Absolute Monocytes: 540 cells/uL (ref 200–950)
Basophils Absolute: 40 cells/uL (ref 0–200)
Basophils Relative: 0.8 %
Eosinophils Absolute: 350 cells/uL (ref 15–500)
Eosinophils Relative: 7 %
HCT: 44.9 % (ref 38.5–50.0)
Hemoglobin: 15.1 g/dL (ref 13.2–17.1)
Lymphs Abs: 2140 cells/uL (ref 850–3900)
MCH: 31.7 pg (ref 27.0–33.0)
MCHC: 33.6 g/dL (ref 32.0–36.0)
MCV: 94.1 fL (ref 80.0–100.0)
MPV: 11.4 fL (ref 7.5–12.5)
Monocytes Relative: 10.8 %
Neutro Abs: 1930 cells/uL (ref 1500–7800)
Neutrophils Relative %: 38.6 %
Platelets: 136 10*3/uL — ABNORMAL LOW (ref 140–400)
RBC: 4.77 10*6/uL (ref 4.20–5.80)
RDW: 13.2 % (ref 11.0–15.0)
Total Lymphocyte: 42.8 %
WBC: 5 10*3/uL (ref 3.8–10.8)

## 2018-10-19 LAB — COMPLETE METABOLIC PANEL WITH GFR
AG Ratio: 2 (calc) (ref 1.0–2.5)
ALT: 11 U/L (ref 9–46)
AST: 24 U/L (ref 10–35)
Albumin: 4.4 g/dL (ref 3.6–5.1)
Alkaline phosphatase (APISO): 62 U/L (ref 35–144)
BUN: 16 mg/dL (ref 7–25)
CO2: 31 mmol/L (ref 20–32)
Calcium: 10.1 mg/dL (ref 8.6–10.3)
Chloride: 103 mmol/L (ref 98–110)
Creat: 1.01 mg/dL (ref 0.70–1.25)
GFR, Est African American: 92 mL/min/{1.73_m2} (ref >=60)
GFR, Est Non African American: 79 mL/min/{1.73_m2} (ref >=60)
Globulin: 2.2 g/dL (calc) (ref 1.9–3.7)
Glucose, Bld: 105 mg/dL — ABNORMAL HIGH (ref 65–99)
Potassium: 3.5 mmol/L (ref 3.5–5.3)
Sodium: 140 mmol/L (ref 135–146)
Total Bilirubin: 0.7 mg/dL (ref 0.2–1.2)
Total Protein: 6.6 g/dL (ref 6.1–8.1)

## 2018-10-19 LAB — LIPID PANEL
Cholesterol: 149 mg/dL (ref <200)
HDL: 41 mg/dL (ref >=40)
LDL Cholesterol (Calc): 89 mg/dL (calc)
Non-HDL Cholesterol (Calc): 108 mg/dL (calc) (ref <130)
Total CHOL/HDL Ratio: 3.6 (calc) (ref <5.0)
Triglycerides: 101 mg/dL (ref <150)

## 2018-10-19 LAB — VITAMIN D 25 HYDROXY (VIT D DEFICIENCY, FRACTURES): Vit D, 25-Hydroxy: 38 ng/mL (ref 30–100)

## 2018-10-19 LAB — TSH: TSH: 1.85 mIU/L (ref 0.40–4.50)

## 2018-10-19 LAB — FRUCTOSAMINE: Fructosamine: 250 umol/L (ref 205–285)

## 2018-10-19 LAB — MAGNESIUM: Magnesium: 2 mg/dL (ref 1.5–2.5)

## 2018-10-19 LAB — URIC ACID: Uric Acid, Serum: 6.4 mg/dL (ref 4.0–8.0)

## 2018-10-19 LAB — INSULIN, RANDOM: Insulin: 4.8 u[IU]/mL

## 2018-11-09 ENCOUNTER — Other Ambulatory Visit: Payer: Self-pay | Admitting: Internal Medicine

## 2018-11-09 DIAGNOSIS — N529 Male erectile dysfunction, unspecified: Secondary | ICD-10-CM

## 2018-11-09 MED ORDER — SILDENAFIL CITRATE 100 MG PO TABS: ORAL_TABLET | ORAL | 0 refills | Status: DC

## 2019-01-02 ENCOUNTER — Other Ambulatory Visit: Payer: Self-pay | Admitting: Internal Medicine

## 2019-01-02 DIAGNOSIS — N529 Male erectile dysfunction, unspecified: Secondary | ICD-10-CM

## 2019-01-02 MED ORDER — SILDENAFIL CITRATE 100 MG PO TABS: ORAL_TABLET | ORAL | 0 refills | Status: DC

## 2019-01-17 NOTE — Progress Notes (Signed)
FOLLOW UP  Assessment and Plan:   Hypertension Well controlled with current medications  Monitor blood pressure at home; patient to call if consistently greater than 130/80 Continue DASH diet.   Reminder to go to the ER if any CP, SOB, nausea, dizziness, severe HA, changes vision/speech, left arm numbness and tingling and jaw pain.  Cholesterol At goal; continue pravastatin 20 mg daily  Continue low cholesterol diet and exercise.  Check lipid panel.   Hx of prediabetes Recent A1Cs at goal Discussed diet/exercise, weight management  Defer A1C; check CMP  Overweight Long discussion about weight loss, diet, and exercise Recommended diet heavy in fruits and veggies and low in animal meats, cheeses, and dairy products, appropriate calorie intake Discussed appropriate weight for height  Follow up at next visit  Vitamin D Def/ osteoporosis prevention Below goal at recent check; he did not increase dose - advised to double to 4000 IU daily  continue to recommend supplementation for goal of 60-100 Defer vitamin D level to next OV  Gout Continue allopurinol Diet discussed Check uric acid as needed  Continue diet and meds as discussed. Further disposition pending results of labs. Discussed med's effects and SE's.   Over 30 minutes of exam, counseling, chart review, and critical decision making was performed.   Future Appointments  Date Time Provider [place]  05/03/2019  2:00 PM Vicie Mutters, PA-C [id number] None    ----------------------------------------------------------------------------------------------------------------------  HPI 63 y.o. very pleasant and friendly AA male  presents for 3 month follow up on hypertension, cholesterol, hx of prediabetes, weight and vitamin D deficiency.   He has GERD but recently controlled fairly with lifestyle and PRN mylanta  BMI is Body mass index is 30.56 kg/m., he has been working on diet, doesn't intentionally  exercise but reports he is active throughout the day - increasing fruit intake, greens from his garden, watching portions and cutting junk food.  Wt Readings from Last 3 Encounters:  01/18/19 201 lb (91.2 kg)  10/17/18 191 lb 3.2 oz (86.7 kg)  07/11/18 194 lb (88 kg)   His blood pressure has been controlled at home, today their BP is BP: 120/68  He does not workout. He denies chest pain, shortness of breath, dizziness.   He is on cholesterol medication (pravastatin 20 mg daily) and denies myalgias. His cholesterol is at goal. The cholesterol last visit was:   Lab Results  Component Value Date   CHOL 149 10/17/2018   HDL 41 10/17/2018   LDLCALC 89 10/17/2018   TRIG 101 10/17/2018   CHOLHDL 3.6 10/17/2018    He has been working on diet and exercise for hx of prediabetes, and denies nausea, paresthesia of the feet, polydipsia, polyuria, visual disturbances, vomiting and weight loss. Last A1C in the office was:  Lab Results  Component Value Date   HGBA1C 5.5 07/11/2018   Patient is on Vitamin D supplement (taking 5000 IU, up from 2000 IU since last visit) remained well below goal of 60-100 at recent check:  Lab Results  Component Value Date   VD25OH 38 10/17/2018     Patient is on allopurinol for gout and does not report a recent flare.  Lab Results  Component Value Date   LABURIC 6.4 10/17/2018   He is on flomax due to reports of slow urine flow, sense of incomplete bladder emptying improved by medication.  He takes 50 mg sildenafil prn ED which works for him.  Lab Results  Component Value Date   PSA 1.6  04/05/2018   PSA 2.2 03/29/2017   PSA 1.6 03/26/2016      Current Medications:  Current Outpatient Medications on File Prior to Visit  Medication Sig  . acyclovir (ZOVIRAX) 800 MG tablet TAKE 1 TABLET BY MOUTH ONCE DAILY  . allopurinol (ZYLOPRIM) 300 MG tablet TAKE 1 TABLET BY MOUTH ONCE DAILY FOR GOUT  . alum & mag hydroxide-simeth (MAALOX/MYLANTA) 200-200-20 MG/5ML  suspension Take 30 mLs by mouth 2 (two) times daily.  Marland Kitchen aspirin EC 81 MG tablet Take 81 mg by mouth daily.  Marland Kitchen atenolol (TENORMIN) 100 MG tablet Take 1 tablet Daily for BP  . B Complex-C (SUPER B COMPLEX PO) Take 1 tablet by mouth daily.  . Cholecalciferol (VITAMIN D) 2000 UNITS CAPS Take 4,000 Units by mouth daily.  . Flaxseed, Linseed, (FLAX SEED OIL) 1000 MG CAPS Take 1,000 mg by mouth daily.  . fluticasone (FLONASE) 50 MCG/ACT nasal spray Place 2 sprays into both nostrils daily.  Marland Kitchen glucose blood (FREESTYLE TEST STRIPS) test strip Use as instructed, DX 250.00  . hydrochlorothiazide (HYDRODIURIL) 25 MG tablet Take 1 tablet every morning for BP & Fluid Retention  . Lancets (FREESTYLE) lancets   . loratadine (EQ ALLERGY RELIEF) 10 MG tablet TAKE 1 TABLET BY MOUTH DAILY FOR ALLERGIES  . Omega-3 Fatty Acids (FISH OIL) 1000 MG CAPS Take 1,000 mg by mouth daily.  . pravastatin (PRAVACHOL) 40 MG tablet TAKE 1 TABLET BY MOUTH ONCE DAILY  . sildenafil (VIAGRA) 100 MG tablet Take 1/2 to 1 tablet /day  ONLY  if needed for XXXX  . tamsulosin (FLOMAX) 0.4 MG CAPS capsule Take 1 capsule (0.4 mg total) by mouth at bedtime.   No current facility-administered medications on file prior to visit.      Allergies:  Allergies  Allergen Reactions  . Ace Inhibitors     REACTION: cough  . Trilipix [Choline Fenofibrate] Other (See Comments)    Heart burn     Medical History:  Past Medical History:  Diagnosis Date  . GERD (gastroesophageal reflux disease)   . Gout   . HSV-1 (herpes simplex virus 1) infection   . HSV-2 (herpes simplex virus 2) infection   . Hyperlipidemia   . Hypertension   . Hypogonadism male   . Obese   . Pneumonia    walking -many years ago  . Pre-diabetes    Family history- Reviewed and unchanged Social history- Reviewed and unchanged   Review of Systems:  Review of Systems  Constitutional: Negative for malaise/fatigue and weight loss.  HENT: Negative for hearing loss and  tinnitus.   Eyes: Negative for blurred vision and double vision.  Respiratory: Negative for cough, shortness of breath and wheezing.   Cardiovascular: Negative for chest pain, palpitations, orthopnea, claudication and leg swelling.  Gastrointestinal: Negative for abdominal pain, blood in stool, constipation, diarrhea, heartburn, melena, nausea and vomiting.  Genitourinary: Negative.   Musculoskeletal: Negative for joint pain and myalgias.  Skin: Negative for rash.  Neurological: Negative for dizziness, tingling, sensory change, weakness and headaches.  Endo/Heme/Allergies: Negative for polydipsia.  Psychiatric/Behavioral: Negative.   All other systems reviewed and are negative.   Physical Exam: BP 120/68   Pulse 63   Temp (!) 97.3 F (36.3 C)   Wt 201 lb (91.2 kg)   SpO2 98%   BMI 30.56 kg/m  Wt Readings from Last 3 Encounters:  01/18/19 201 lb (91.2 kg)  10/17/18 191 lb 3.2 oz (86.7 kg)  07/11/18 194 lb (88 kg)  General Appearance: Well nourished, in no apparent distress. Eyes: PERRLA, EOMs, conjunctiva no swelling or erythema Sinuses: No Frontal/maxillary tenderness ENT/Mouth: Ext aud canals clear, TMs without erythema, bulging. No erythema, swelling, or exudate on post pharynx.  Tonsils not swollen or erythematous. Hearing normal.  Neck: Supple, thyroid normal.  Respiratory: Respiratory effort normal, BS equal bilaterally without rales, rhonchi, wheezing or stridor.  Cardio: RRR with no MRGs. Brisk peripheral pulses without edema.  Abdomen: Soft, + BS.  Non tender, no guarding, rebound, hernias, masses. Lymphatics: Non tender without lymphadenopathy.  Musculoskeletal: Full ROM, 5/5 strength, Normal gait Skin: Warm, dry without rashes, lesions, ecchymosis.  Neuro: Cranial nerves intact. No cerebellar symptoms.  Psych: Awake and oriented X 3, normal affect, Insight and Judgment appropriate.    Izora Ribas, NP 4:54 PM [facility]

## 2019-01-18 ENCOUNTER — Ambulatory Visit: Payer: BC Managed Care – PPO | Admitting: Adult Health

## 2019-01-18 ENCOUNTER — Other Ambulatory Visit: Payer: Self-pay

## 2019-01-18 ENCOUNTER — Encounter: Payer: Self-pay | Admitting: Adult Health

## 2019-01-18 VITALS — BP 120/68 | HR 63 | Temp 97.3°F | Wt 201.0 lb

## 2019-01-18 DIAGNOSIS — E782 Mixed hyperlipidemia: Secondary | ICD-10-CM

## 2019-01-18 DIAGNOSIS — I10 Essential (primary) hypertension: Secondary | ICD-10-CM | POA: Diagnosis not present

## 2019-01-18 DIAGNOSIS — E559 Vitamin D deficiency, unspecified: Secondary | ICD-10-CM

## 2019-01-18 DIAGNOSIS — R7309 Other abnormal glucose: Secondary | ICD-10-CM

## 2019-01-18 DIAGNOSIS — E663 Overweight: Secondary | ICD-10-CM

## 2019-01-18 DIAGNOSIS — M1A00X0 Idiopathic chronic gout, unspecified site, without tophus (tophi): Secondary | ICD-10-CM

## 2019-01-18 DIAGNOSIS — Z79899 Other long term (current) drug therapy: Secondary | ICD-10-CM

## 2019-01-18 MED ORDER — PRAVASTATIN SODIUM 40 MG PO TABS: 20.0000 mg | ORAL_TABLET | Freq: Every day | ORAL | 0 refills | Status: DC

## 2019-01-18 NOTE — Patient Instructions (Addendum)
Goals    . HEMOGLOBIN A1C < 5.7    . Weight (lb) < 190 lb (86.2 kg) (pt-stated)       Get on vitamin C 500 mg daily and zinc 50 mg - good for immune system to help fight off cold/flu/covid   Please check all your medications to make sure everything is correct - write down doses and how you are taking         When it comes to diets, agreement about the perfect plan isn't easy to find, even among the experts. Experts at the [employer] developed an idea known as the Healthy Eating Plate. Just imagine a plate divided into logical, healthy portions.  The emphasis is on diet quality:  Load up on vegetables and fruits - one-half of your plate: Aim for color and variety, and remember that potatoes don't count.  Go for whole grains - one-quarter of your plate: Whole wheat, barley, wheat berries, quinoa, oats, brown rice, and foods made with them. If you want pasta, go with whole wheat pasta.  Protein power - one-quarter of your plate: Fish, chicken, beans, and nuts are all healthy, versatile protein sources. Limit red meat.  The diet, however, does go beyond the plate, offering a few other suggestions.  Use healthy plant oils, such as olive, canola, soy, corn, sunflower and peanut. Check the labels, and avoid partially hydrogenated oil, which have unhealthy trans fats.  If you're thirsty, drink water. Coffee and tea are good in moderation, but skip sugary drinks and limit milk and dairy products to one or two daily servings.  The type of carbohydrate in the diet is more important than the amount. Some sources of carbohydrates, such as vegetables, fruits, whole grains, and beans-are healthier than others.  Finally, stay active.

## 2019-01-19 LAB — COMPLETE METABOLIC PANEL WITH GFR
AG Ratio: 1.8 (calc) (ref 1.0–2.5)
ALT: 12 U/L (ref 9–46)
AST: 23 U/L (ref 10–35)
Albumin: 4.4 g/dL (ref 3.6–5.1)
Alkaline phosphatase (APISO): 74 U/L (ref 35–144)
BUN: 19 mg/dL (ref 7–25)
CO2: 32 mmol/L (ref 20–32)
Calcium: 9.7 mg/dL (ref 8.6–10.3)
Chloride: 99 mmol/L (ref 98–110)
Creat: 0.87 mg/dL (ref 0.70–1.25)
GFR, Est African American: 107 mL/min/{1.73_m2} (ref >=60)
GFR, Est Non African American: 92 mL/min/{1.73_m2} (ref >=60)
Globulin: 2.5 g/dL (calc) (ref 1.9–3.7)
Glucose, Bld: 103 mg/dL — ABNORMAL HIGH (ref 65–99)
Potassium: 3.4 mmol/L — ABNORMAL LOW (ref 3.5–5.3)
Sodium: 139 mmol/L (ref 135–146)
Total Bilirubin: 0.7 mg/dL (ref 0.2–1.2)
Total Protein: 6.9 g/dL (ref 6.1–8.1)

## 2019-01-19 LAB — CBC WITH DIFFERENTIAL/PLATELET
Absolute Monocytes: 665 cells/uL (ref 200–950)
Basophils Absolute: 31 cells/uL (ref 0–200)
Basophils Relative: 0.5 %
Eosinophils Absolute: 201 cells/uL (ref 15–500)
Eosinophils Relative: 3.3 %
HCT: 44.3 % (ref 38.5–50.0)
Hemoglobin: 14.9 g/dL (ref 13.2–17.1)
Lymphs Abs: 2202 cells/uL (ref 850–3900)
MCH: 31 pg (ref 27.0–33.0)
MCHC: 33.6 g/dL (ref 32.0–36.0)
MCV: 92.1 fL (ref 80.0–100.0)
MPV: 12.1 fL (ref 7.5–12.5)
Monocytes Relative: 10.9 %
Neutro Abs: 3001 cells/uL (ref 1500–7800)
Neutrophils Relative %: 49.2 %
Platelets: 136 10*3/uL — ABNORMAL LOW (ref 140–400)
RBC: 4.81 10*6/uL (ref 4.20–5.80)
RDW: 12.8 % (ref 11.0–15.0)
Total Lymphocyte: 36.1 %
WBC: 6.1 10*3/uL (ref 3.8–10.8)

## 2019-01-19 LAB — LIPID PANEL
Cholesterol: 128 mg/dL (ref <200)
HDL: 38 mg/dL — ABNORMAL LOW (ref >=40)
LDL Cholesterol (Calc): 68 mg/dL (calc)
Non-HDL Cholesterol (Calc): 90 mg/dL (calc) (ref <130)
Total CHOL/HDL Ratio: 3.4 (calc) (ref <5.0)
Triglycerides: 139 mg/dL (ref <150)

## 2019-01-19 LAB — MAGNESIUM: Magnesium: 1.7 mg/dL (ref 1.5–2.5)

## 2019-01-19 LAB — TSH: TSH: 1.61 mIU/L (ref 0.40–4.50)

## 2019-03-18 ENCOUNTER — Other Ambulatory Visit: Payer: Self-pay | Admitting: Internal Medicine

## 2019-03-18 DIAGNOSIS — B009 Herpesviral infection, unspecified: Secondary | ICD-10-CM

## 2019-03-20 ENCOUNTER — Other Ambulatory Visit: Payer: Self-pay | Admitting: Internal Medicine

## 2019-03-20 DIAGNOSIS — N529 Male erectile dysfunction, unspecified: Secondary | ICD-10-CM

## 2019-03-20 MED ORDER — SILDENAFIL CITRATE 100 MG PO TABS: ORAL_TABLET | ORAL | 0 refills | Status: DC

## 2019-04-11 ENCOUNTER — Encounter: Payer: Self-pay | Admitting: Physician Assistant

## 2019-04-25 ENCOUNTER — Other Ambulatory Visit: Payer: Self-pay | Admitting: Internal Medicine

## 2019-04-25 DIAGNOSIS — M1A00X0 Idiopathic chronic gout, unspecified site, without tophus (tophi): Secondary | ICD-10-CM

## 2019-05-01 NOTE — Progress Notes (Signed)
Complete Physical  Assessment and Plan:  Encounter for general adult medical examination with abnormal findings 1 year Due for colonoscopy Get TDAP AFTER COVID- given information on covid  Essential hypertension -     CBC with Differential/Platelet -     COMPLETE METABOLIC PANEL WITH GFR -     TSH -     Urinalysis, Routine w reflex microscopic -     Microalbumin / creatinine urine ratio -     EKG 12-Lead- declines, has to take him mom to the doctor -     DG Chest 2 View; Future - continue medications, DASH diet, exercise and monitor at home. Call if greater than 130/80.   Hyperlipidemia -     Lipid panel check lipids decrease fatty foods increase activity.   Other abnormal glucose (hx of prediabetes) -     Hemoglobin A1c Discussed disease progression and risks Discussed diet/exercise, weight management and risk modification  Idiopathic chronic gout without tophus, unspecified site Gout- recheck Uric acid as needed, Diet discussed, continue medications.  Medication management -     Magnesium  Vitamin D deficiency -     VITAMIN D 25 Hydroxy (Vit-D Deficiency, Fractures)  Gastroesophageal reflux disease, unspecified whether esophagitis present -     famotidine (PEPCID) 40 MG tablet; Take 1 tablet (40 mg total) by mouth every evening.  Overweight (BMI 25.0-29.9) -     Testosterone  Screening PSA (prostate specific antigen) -     PSA   Discussed med's effects and SE's. Screening labs and tests as requested with regular follow-up as recommended. Over 40 minutes of exam, counseling, chart review and critical decision making was performed  HPI Patient presents for a complete physical.  Reports he has not had a gout flare in a long time.    His blood pressure has been controlled at home, today their BP is BP: 134/72   He was taking zantac for GERD but this has been recalled. He has been having reflux, not daily, varies. No ETOH. He takes aleve as needed for body aches.  No black stool, no blood in the stool.   He states his hands and feet have been bothering him.   He does not workout BUT HE IS getting back into mowing grass.  He denies chest pain, shortness of breath, dizziness.  He is on cholesterol medication and denies myalgias. His cholesterol is not at goal. The cholesterol last visit was:   Lab Results  Component Value Date   CHOL 128 01/18/2019   HDL 38 (L) 01/18/2019   LDLCALC 68 01/18/2019   TRIG 139 01/18/2019   CHOLHDL 3.4 01/18/2019   He has not been working on diet and exercise for prediabetes, he is on bASA, he is not on ACE/ARB and denies hyperglycemia, hypoglycemia , nausea, polydipsia and polyuria. Last A1C in the office was:  Lab Results  Component Value Date   HGBA1C 5.5 07/11/2018   Last GFR:  Lab Results  Component Value Date   GFRAA 107 01/18/2019   Patient is on Vitamin D supplement.   Lab Results  Component Value Date   VD25OH 38 10/17/2018     Last PSA was: Lab Results  Component Value Date   PSA 1.6 04/05/2018    Current Medications:  Current Outpatient Medications on File Prior to Visit  Medication Sig Dispense Refill  . acyclovir (ZOVIRAX) 800 MG tablet Take 1 tablet Daily for Fever Blisters & Cold Sores 90 tablet 3  . allopurinol (ZYLOPRIM)  300 MG tablet Take 1 tablet Daily to Prevent Gout 90 tablet 0  . alum & mag hydroxide-simeth (MAALOX/MYLANTA) 200-200-20 MG/5ML suspension Take 30 mLs by mouth 2 (two) times daily.    Marland Kitchen aspirin EC 81 MG tablet Take 81 mg by mouth daily.    Marland Kitchen atenolol (TENORMIN) 100 MG tablet Take 1 tablet Daily for BP 90 tablet 1  . B Complex-C (SUPER B COMPLEX PO) Take 1 tablet by mouth daily.    . Cholecalciferol (VITAMIN D) 2000 UNITS CAPS Take 4,000 Units by mouth daily.    . Flaxseed, Linseed, (FLAX SEED OIL) 1000 MG CAPS Take 1,000 mg by mouth daily.    . fluticasone (FLONASE) 50 MCG/ACT nasal spray Place 2 sprays into both nostrils daily. 48 g 3  . glucose blood (FREESTYLE TEST  STRIPS) test strip Use as instructed, DX 250.00 100 each 12  . hydrochlorothiazide (HYDRODIURIL) 25 MG tablet Take 1 tablet Daily for BP & Fluid Retention / Ankle Swelling 90 tablet 3  . Lancets (FREESTYLE) lancets   12  . loratadine (EQ ALLERGY RELIEF) 10 MG tablet TAKE 1 TABLET BY MOUTH DAILY FOR ALLERGIES 90 tablet 3  . Omega-3 Fatty Acids (FISH OIL) 1000 MG CAPS Take 1,000 mg by mouth daily.    . pravastatin (PRAVACHOL) 40 MG tablet Take 0.5 tablets (20 mg total) by mouth daily. 90 tablet 0  . sildenafil (VIAGRA) 100 MG tablet Take 1/2 to 1 tablet /day  ONLY  if needed for XXXX 12 tablet 0  . tamsulosin (FLOMAX) 0.4 MG CAPS capsule Take 1 capsule (0.4 mg total) by mouth at bedtime. 90 capsule 1   No current facility-administered medications on file prior to visit.   Allergies:  Allergies  Allergen Reactions  . Ace Inhibitors     REACTION: cough  . Trilipix [Choline Fenofibrate] Other (See Comments)    Heart burn   Health Maintenance:  Immunization History  Administered Date(s) Administered  . PPD Test 02/13/2014  . Pneumococcal Polysaccharide-23 02/23/2002  . Tdap 05/15/2009   Tetanus: 2011 will wait until after COVID vaccine Pneumovax: 2004 N/A Prevnar 13: N/A Flu vaccine: 2020, at work Zostavax: Due dicussed   HIV : screening 2018 non-reactive  Colonoscopy: 2018, Due 2021 Polyps EGD: 2008 Eye Exam: 2019, Due for 2020 Dentist: 2019, Due for 2020  Patient Care Team: Unk Pinto, MD as PCP - General (Internal Medicine)  Medical History:  has Hyperlipidemia; Essential hypertension; GERD; Vitamin D deficiency; Other abnormal glucose (hx of prediabetes); Medication management; Overweight (BMI 25.0-29.9); and Gout on their problem list. Surgical History:  He  has a past surgical history that includes Back surgery (2012); Laparoscopic inguinal hernia with umbilical hernia (Right, 08/15/2013); Insertion of mesh (N/A, 08/15/2013); and Hernia repair. Family History:  His  family history includes Cancer (age of onset: 2) in his brother; Diabetes in his mother; Hypertension in his brother, brother, brother, and mother. Social History:   reports that he quit smoking about 17 years ago. He has never used smokeless tobacco. He reports that he does not drink alcohol or use drugs. Review of Systems:  Review of Systems  Constitutional: Negative.   HENT: Negative.   Eyes: Negative.   Respiratory: Negative.   Cardiovascular: Negative.   Gastrointestinal: Negative.   Genitourinary: Negative.   Musculoskeletal: Negative.   Skin: Negative.     Physical Exam: Estimated body mass index is 30.41 kg/m as calculated from the following:   Height as of this encounter: 5\' 8"  (1.727  m).   Weight as of this encounter: 200 lb (90.7 kg). BP 134/72   Pulse 64   Temp 97.6 F (36.4 C)   Ht 5\' 8"  (1.727 m)   Wt 200 lb (90.7 kg)   SpO2 96%   BMI 30.41 kg/m  General Appearance: Well nourished, in no apparent distress.  Eyes: PERRLA, EOMs, conjunctiva no swelling or erythema, normal fundi and vessels.  Sinuses: No Frontal/maxillary tenderness  ENT/Mouth: Ext aud canals clear, normal light reflex with TMs without erythema, bulging. Good dentition. No erythema, swelling, or exudate on post pharynx. Tonsils not swollen or erythematous. Hearing normal.  Neck: Supple, thyroid normal. No bruits  Respiratory: Respiratory effort normal, BS equal bilaterally without rales, rhonchi, wheezing or stridor.  Cardio: RRR without murmurs, rubs or gallops. Brisk peripheral pulses without edema.  Chest: symmetric, with normal excursions and percussion.  Abdomen: Soft, nontender, no guarding, rebound, hernias, masses, or organomegaly.  Lymphatics: Non tender without lymphadenopathy.  Genitourinary:  Musculoskeletal: Full ROM all peripheral extremities,5/5 strength, and normal gait.  Skin: Warm, dry without rashes, lesions, ecchymosis. Neuro: Cranial nerves intact, reflexes equal  bilaterally. Normal muscle tone, no cerebellar symptoms. Sensation intact.  Psych: Awake and oriented X 3, normal affect, Insight and Judgment appropriate.   EKG:  No ST changes Comparable to previous  Vicie Mutters 1:57 PM [facility]

## 2019-05-03 ENCOUNTER — Other Ambulatory Visit: Payer: Self-pay

## 2019-05-03 ENCOUNTER — Ambulatory Visit (INDEPENDENT_AMBULATORY_CARE_PROVIDER_SITE_OTHER): Payer: BC Managed Care – PPO | Admitting: Physician Assistant

## 2019-05-03 ENCOUNTER — Encounter: Payer: Self-pay | Admitting: Physician Assistant

## 2019-05-03 VITALS — BP 134/72 | HR 64 | Temp 97.6°F | Ht 68.0 in | Wt 200.0 lb

## 2019-05-03 DIAGNOSIS — Z0001 Encounter for general adult medical examination with abnormal findings: Secondary | ICD-10-CM

## 2019-05-03 DIAGNOSIS — E559 Vitamin D deficiency, unspecified: Secondary | ICD-10-CM

## 2019-05-03 DIAGNOSIS — R7309 Other abnormal glucose: Secondary | ICD-10-CM

## 2019-05-03 DIAGNOSIS — K219 Gastro-esophageal reflux disease without esophagitis: Secondary | ICD-10-CM

## 2019-05-03 DIAGNOSIS — Z125 Encounter for screening for malignant neoplasm of prostate: Secondary | ICD-10-CM

## 2019-05-03 DIAGNOSIS — M1A00X0 Idiopathic chronic gout, unspecified site, without tophus (tophi): Secondary | ICD-10-CM

## 2019-05-03 DIAGNOSIS — E663 Overweight: Secondary | ICD-10-CM | POA: Diagnosis not present

## 2019-05-03 DIAGNOSIS — I10 Essential (primary) hypertension: Secondary | ICD-10-CM | POA: Diagnosis not present

## 2019-05-03 DIAGNOSIS — R350 Frequency of micturition: Secondary | ICD-10-CM | POA: Diagnosis not present

## 2019-05-03 DIAGNOSIS — N401 Enlarged prostate with lower urinary tract symptoms: Secondary | ICD-10-CM

## 2019-05-03 DIAGNOSIS — E782 Mixed hyperlipidemia: Secondary | ICD-10-CM | POA: Diagnosis not present

## 2019-05-03 DIAGNOSIS — Z79899 Other long term (current) drug therapy: Secondary | ICD-10-CM

## 2019-05-03 DIAGNOSIS — E876 Hypokalemia: Secondary | ICD-10-CM

## 2019-05-03 MED ORDER — ALLOPURINOL 300 MG PO TABS: ORAL_TABLET | ORAL | 1 refills | Status: DC

## 2019-05-03 MED ORDER — FAMOTIDINE 40 MG PO TABS: 40.0000 mg | ORAL_TABLET | Freq: Every evening | ORAL | 1 refills | Status: DC

## 2019-05-03 NOTE — Patient Instructions (Addendum)
Aleve is an antiinflammatory, can take 1 pill twice a day for 2 weeks and then take as needed.  You can take tylenol (500mg ) or tylenol arthritis (650mg ) with the meloxicam/antiinflammatories. The max you can take of tylenol a day is 3000mg  daily, this is a max of 6 pills a day of the regular tyelnol (500mg ) or a max of 4 a day of the tylenol arthritis (650mg ) as long as no other medications you are taking contain tylenol.   Aleve can cause inflammation in your stomach and can cause ulcers or bleeding, this will look like black tarry stools Make sure you take your aleve with food Can take with pepcid that I am sending in for you  INFORMATION ABOUT YOUR XRAY  Can walk into [address] building for an Insurance account manager. They will have the order and take you back. You do not any paper work, I should get the result back today or tomorrow. This order is good for a year.  Can call [phone] to schedule an appointment if you wish.    Due for your colonoscopy- Phone: [phone];  Colon cancer is 3rd most diagnosed cancer and 2nd leading cause of death in both men and women 22 years of age and older despite being one of the most preventable and treatable cancers if found early.  4 of out 5 people diagnosed with colon cancer have NO prior family history.  When caught EARLY 90% of colon cancer is curable.    At this time we do not have a plan to get the COVID vaccine in our office. You can go to NightlifeExpo.[email or web address] to find out if you are eligible.  If you are eligible you can go to [email or web address]  to find a local place that is giving the shot.    [email or web address] also has all of this information if the sites above are not working for you.   There are also several waiting list that you can sign up for here are some below:  [place] go to [email or web address].br to sign up for notification when additional vaccine appointments are available.   You can also sign up at [facility]  for a wait list.   [facility] and [facility] have a wait list on line and you can sign up for it there.     If you have further questions or concerns about the vaccine process, please visit [email or web address]  Individuals seeking information about the vaccines and state's phased distribution plan can learn more by going to - [email or web address]   Here is some information about the vaccines. The data is very good and this information hopefully answers a lot of your questions and give you a confidence boost.   The Pfizer and Moderna vaccines are messenger RNA vaccines. That technology is not new, it has been studied for 20 years at least, used for cancer and MS treatment. They had started using the vaccine for MERS AND SARS (both a different coronavirus) 10 years ago but never finished so we had a good backbone for this vaccine.   There were no short cuts with the techniques for these clinical  trials, just lots of willing participants quickly and lots of up front money helped speed up the process.   The mRNA is very fragile which is why it needs to be kept so cold and thawed a certain way, think of it as a message in a glass bottle. NO PART of the virus is in this vaccine, it is a clip of the genetic  sequence. This mRNA is injected in your arm, connects with a ribosome, delivers the message and the degrades.  That is part of the cause of the sore arm, the mRNA never leaves your arm. It degrades there. The mRNA does not go into our nucleotide where our DNA is, and we would need a DNA reverse transcriptase to take RNA to DNA, we do not have this, it can not change our DNA. The ribosome that got the message creates a protein and that protein circulates in our body and we have an immune reaction to that creating antibodies. Any time our immune system is triggered, inflammation is triggered too so you can have a temp, muscle aches, etc. Normal reaction.  I have seen so many patients that just  had mild COVID in the office weeks later still have issues. We are still learning about post COVID syndrome, the CDC should be coming out for guidelines for practioners soon. There is too much unknown about COVID. We have been using vaccines for over 100 years or more, i Personnel officer. Ask your parents or any older friends about polio and that vaccine, that was a disease shutting down schools,had kids in iron lung, devastating young kids and families. People lined up for that vaccine and technology has only improved.   Please get the vaccine. If you have any further questions please make an appointment in the office to discuss further.  Caleb Johns

## 2019-05-04 LAB — COMPLETE METABOLIC PANEL WITH GFR
AG Ratio: 2 (calc) (ref 1.0–2.5)
ALT: 12 U/L (ref 9–46)
AST: 24 U/L (ref 10–35)
Albumin: 4.5 g/dL (ref 3.6–5.1)
Alkaline phosphatase (APISO): 57 U/L (ref 35–144)
BUN: 13 mg/dL (ref 7–25)
CO2: 29 mmol/L (ref 20–32)
Calcium: 9.4 mg/dL (ref 8.6–10.3)
Chloride: 102 mmol/L (ref 98–110)
Creat: 0.93 mg/dL (ref 0.70–1.25)
GFR, Est African American: 101 mL/min/{1.73_m2} (ref >=60)
GFR, Est Non African American: 87 mL/min/{1.73_m2} (ref >=60)
Globulin: 2.3 g/dL (calc) (ref 1.9–3.7)
Glucose, Bld: 101 mg/dL — ABNORMAL HIGH (ref 65–99)
Potassium: 3.3 mmol/L — ABNORMAL LOW (ref 3.5–5.3)
Sodium: 139 mmol/L (ref 135–146)
Total Bilirubin: 0.7 mg/dL (ref 0.2–1.2)
Total Protein: 6.8 g/dL (ref 6.1–8.1)

## 2019-05-04 LAB — TESTOSTERONE: Testosterone: 579 ng/dL (ref 250–827)

## 2019-05-04 LAB — CBC WITH DIFFERENTIAL/PLATELET
Absolute Monocytes: 515 cells/uL (ref 200–950)
Basophils Absolute: 29 cells/uL (ref 0–200)
Basophils Relative: 0.6 %
Eosinophils Absolute: 201 cells/uL (ref 15–500)
Eosinophils Relative: 4.1 %
HCT: 42.9 % (ref 38.5–50.0)
Hemoglobin: 14.6 g/dL (ref 13.2–17.1)
Lymphs Abs: 2073 cells/uL (ref 850–3900)
MCH: 31.3 pg (ref 27.0–33.0)
MCHC: 34 g/dL (ref 32.0–36.0)
MCV: 92.1 fL (ref 80.0–100.0)
MPV: 11.4 fL (ref 7.5–12.5)
Monocytes Relative: 10.5 %
Neutro Abs: 2083 cells/uL (ref 1500–7800)
Neutrophils Relative %: 42.5 %
Platelets: 153 10*3/uL (ref 140–400)
RBC: 4.66 10*6/uL (ref 4.20–5.80)
RDW: 13.6 % (ref 11.0–15.0)
Total Lymphocyte: 42.3 %
WBC: 4.9 10*3/uL (ref 3.8–10.8)

## 2019-05-04 LAB — URINALYSIS, ROUTINE W REFLEX MICROSCOPIC
Bacteria, UA: NONE SEEN /HPF
Bilirubin Urine: NEGATIVE
Glucose, UA: NEGATIVE
Hgb urine dipstick: NEGATIVE
Hyaline Cast: NONE SEEN /LPF
Ketones, ur: NEGATIVE
Nitrite: NEGATIVE
Protein, ur: NEGATIVE
RBC / HPF: NONE SEEN /HPF (ref 0–2)
Specific Gravity, Urine: 1.008 (ref 1.001–1.03)
Squamous Epithelial / HPF: NONE SEEN /HPF (ref <=5)
pH: 7 (ref 5.0–8.0)

## 2019-05-04 LAB — LIPID PANEL
Cholesterol: 125 mg/dL (ref <200)
HDL: 38 mg/dL — ABNORMAL LOW (ref >=40)
LDL Cholesterol (Calc): 70 mg/dL (calc)
Non-HDL Cholesterol (Calc): 87 mg/dL (calc) (ref <130)
Total CHOL/HDL Ratio: 3.3 (calc) (ref <5.0)
Triglycerides: 87 mg/dL (ref <150)

## 2019-05-04 LAB — MICROALBUMIN / CREATININE URINE RATIO
Creatinine, Urine: 78 mg/dL (ref 20–320)
Microalb Creat Ratio: 5 mcg/mg creat (ref <30)
Microalb, Ur: 0.4 mg/dL

## 2019-05-04 LAB — VITAMIN D 25 HYDROXY (VIT D DEFICIENCY, FRACTURES): Vit D, 25-Hydroxy: 51 ng/mL (ref 30–100)

## 2019-05-04 LAB — MAGNESIUM: Magnesium: 1.9 mg/dL (ref 1.5–2.5)

## 2019-05-04 LAB — PSA: PSA: 3.7 ng/mL (ref <=4.0)

## 2019-05-04 LAB — HEMOGLOBIN A1C
Hgb A1c MFr Bld: 5.4 % of total Hgb (ref <5.7)
Mean Plasma Glucose: 108 (calc)
eAG (mmol/L): 6 (calc)

## 2019-05-04 LAB — TSH: TSH: 1.06 mIU/L (ref 0.40–4.50)

## 2019-05-04 NOTE — Addendum Note (Signed)
Addended by: Vicie Mutters R on: 05/04/2019 11:14 AM   Modules accepted: Orders

## 2019-05-12 ENCOUNTER — Other Ambulatory Visit (INDEPENDENT_AMBULATORY_CARE_PROVIDER_SITE_OTHER): Payer: BC Managed Care – PPO

## 2019-05-12 DIAGNOSIS — Z79899 Other long term (current) drug therapy: Secondary | ICD-10-CM | POA: Diagnosis not present

## 2019-05-30 ENCOUNTER — Telehealth: Payer: Self-pay

## 2019-05-30 ENCOUNTER — Other Ambulatory Visit: Payer: Self-pay | Admitting: Internal Medicine

## 2019-05-30 DIAGNOSIS — N529 Male erectile dysfunction, unspecified: Secondary | ICD-10-CM

## 2019-05-30 MED ORDER — SILDENAFIL CITRATE 100 MG PO TABS: ORAL_TABLET | ORAL | 0 refills | Status: DC

## 2019-05-30 NOTE — Telephone Encounter (Signed)
UNABLE TO send to address on file due no forwarding address

## 2019-06-14 ENCOUNTER — Other Ambulatory Visit: Payer: Self-pay

## 2019-06-19 ENCOUNTER — Other Ambulatory Visit: Payer: BC Managed Care – PPO

## 2019-06-19 ENCOUNTER — Other Ambulatory Visit: Payer: Self-pay

## 2019-06-19 DIAGNOSIS — Z79899 Other long term (current) drug therapy: Secondary | ICD-10-CM

## 2019-06-19 LAB — COMPLETE METABOLIC PANEL WITH GFR
AG Ratio: 1.9 (calc) (ref 1.0–2.5)
ALT: 12 U/L (ref 9–46)
AST: 20 U/L (ref 10–35)
Albumin: 4.4 g/dL (ref 3.6–5.1)
Alkaline phosphatase (APISO): 69 U/L (ref 35–144)
BUN: 16 mg/dL (ref 7–25)
CO2: 29 mmol/L (ref 20–32)
Calcium: 9.9 mg/dL (ref 8.6–10.3)
Chloride: 103 mmol/L (ref 98–110)
Creat: 1.03 mg/dL (ref 0.70–1.25)
GFR, Est African American: 89 mL/min/{1.73_m2} (ref >=60)
GFR, Est Non African American: 77 mL/min/{1.73_m2} (ref >=60)
Globulin: 2.3 g/dL (calc) (ref 1.9–3.7)
Glucose, Bld: 107 mg/dL — ABNORMAL HIGH (ref 65–99)
Potassium: 3.5 mmol/L (ref 3.5–5.3)
Sodium: 139 mmol/L (ref 135–146)
Total Bilirubin: 0.6 mg/dL (ref 0.2–1.2)
Total Protein: 6.7 g/dL (ref 6.1–8.1)

## 2019-07-21 ENCOUNTER — Encounter: Payer: Self-pay | Admitting: Gastroenterology

## 2019-07-31 ENCOUNTER — Encounter: Payer: Self-pay | Admitting: Gastroenterology

## 2019-08-07 ENCOUNTER — Other Ambulatory Visit: Payer: Self-pay

## 2019-08-07 DIAGNOSIS — M1A00X0 Idiopathic chronic gout, unspecified site, without tophus (tophi): Secondary | ICD-10-CM

## 2019-08-07 MED ORDER — ALLOPURINOL 300 MG PO TABS: ORAL_TABLET | ORAL | 1 refills | Status: DC

## 2019-08-07 NOTE — Telephone Encounter (Signed)
Med refill

## 2019-08-09 ENCOUNTER — Ambulatory Visit (INDEPENDENT_AMBULATORY_CARE_PROVIDER_SITE_OTHER): Payer: BC Managed Care – PPO | Admitting: Adult Health Nurse Practitioner

## 2019-08-09 ENCOUNTER — Other Ambulatory Visit: Payer: Self-pay

## 2019-08-09 ENCOUNTER — Encounter: Payer: Self-pay | Admitting: Adult Health Nurse Practitioner

## 2019-08-09 ENCOUNTER — Ambulatory Visit: Payer: BC Managed Care – PPO | Admitting: Adult Health

## 2019-08-09 VITALS — BP 138/90 | HR 54 | Temp 97.3°F | Ht 68.0 in | Wt 198.2 lb

## 2019-08-09 DIAGNOSIS — E349 Endocrine disorder, unspecified: Secondary | ICD-10-CM

## 2019-08-09 DIAGNOSIS — E782 Mixed hyperlipidemia: Secondary | ICD-10-CM | POA: Diagnosis not present

## 2019-08-09 DIAGNOSIS — N529 Male erectile dysfunction, unspecified: Secondary | ICD-10-CM

## 2019-08-09 DIAGNOSIS — R7309 Other abnormal glucose: Secondary | ICD-10-CM | POA: Diagnosis not present

## 2019-08-09 DIAGNOSIS — E559 Vitamin D deficiency, unspecified: Secondary | ICD-10-CM

## 2019-08-09 DIAGNOSIS — Z202 Contact with and (suspected) exposure to infections with a predominantly sexual mode of transmission: Secondary | ICD-10-CM | POA: Diagnosis not present

## 2019-08-09 DIAGNOSIS — Z79899 Other long term (current) drug therapy: Secondary | ICD-10-CM

## 2019-08-09 DIAGNOSIS — I10 Essential (primary) hypertension: Secondary | ICD-10-CM | POA: Diagnosis not present

## 2019-08-09 DIAGNOSIS — M1A00X0 Idiopathic chronic gout, unspecified site, without tophus (tophi): Secondary | ICD-10-CM

## 2019-08-09 DIAGNOSIS — K219 Gastro-esophageal reflux disease without esophagitis: Secondary | ICD-10-CM

## 2019-08-09 DIAGNOSIS — Z6831 Body mass index (BMI) 31.0-31.9, adult: Secondary | ICD-10-CM

## 2019-08-09 DIAGNOSIS — E663 Overweight: Secondary | ICD-10-CM

## 2019-08-09 MED ORDER — FAMOTIDINE 40 MG PO TABS: 40.0000 mg | ORAL_TABLET | Freq: Every evening | ORAL | 1 refills | Status: DC

## 2019-08-09 MED ORDER — SILDENAFIL CITRATE 100 MG PO TABS: ORAL_TABLET | ORAL | 0 refills | Status: DC

## 2019-08-09 NOTE — Progress Notes (Signed)
FOLLOW UP 3 MONTH  Assessment and Plan:   Caleb Johns was seen today for follow-up.  Diagnoses and all orders for this visit:  Essential hypertension Continue current medications: Monitor blood pressure at home; call if consistently over 130/80 Continue DASH diet.   Reminder to go to the ER if any CP, SOB, nausea, dizziness, severe HA, changes vision/speech, left arm numbness and tingling and jaw pain. -     COMPLETE METABOLIC PANEL WITH GFR -     CBC with Differential/Platelet  Hyperlipidemia Continue medications: Discussed dietary and exercise modifications Low fat diet -     Lipid panel  Other abnormal glucose (hx of prediabetes) Discussed dietary and exercise modifications  Idiopathic chronic gout without tophus, unspecified site Continue allopurinol 300mg  daily No recent flares Discussed dietary modifications Continue to monitor  Vitamin D deficiency Continue supplementation to maintain goal of 60-100 Taking Vitamin D 4,000 IU daily  Gastroesophageal reflux disease, unspecified whether esophagitis present Doing well at this time Continue with benefit Diet discussed Monitor for triggers Avoid food with high acid content Avoid excessive cafeine Increase water intake -     famotidine (PEPCID) 40 MG tablet; Take 1 tablet (40 mg total) by mouth every evening.  Overweight (BMI 25.0-29.9) Discussed dietary and exercise modifications  Erectile dysfunction, unspecified erectile dysfunction type Discussed use of medication and side effects -     sildenafil (VIAGRA) 100 MG tablet; Take 1/2 to 1 tablet /day  ONLY if needed 2min-1hr prior to sexual encounter.  Testosterone deficiency No supplementation at this time  BMI 31.0-31.9,adult Discussed dietary and exercise modifications  Medication management Continued  STD exposure Discussed safe sex practices and S&S that require medical attention -     C. trachomatis/N. gonorrhoeae RNA   Discussed med's effects and  SE's. Screening labs and tests as requested with regular follow-up as recommended. Over 30 minutes of face to face exam, counseling, chart review and critical decision making was performed   HPI 64 yo male patient presents for 3 month follow up for HTN, HLD, GERD, gout, Vitamin D deficiency, and weight.  Reports he has not had a gout flare in a long time he continues to take allopurinol 300mg  daily for prevention.    His blood pressure has been controlled at home, today their BP is BP: 138/90   He was taking zantac for GERD but has switched to famotidine since.  Report this is working well for him. He reports he would like to be check for STD's today.  Reports he is sexual active, multiple partners of opposite sex.  Reports he is using condoms.  He is asymptomatic and denies any lesions or areas of concern, penile discharge, dysuria or hematuria, abdominal pains, N/V.  He had low potassium 05/03/19 at 3.3 and recheck 06/19/19 3.5.  Will re-check today, asymptomatic.  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  He denies chest pain, shortness of breath, dizziness.  He is on cholesterol medication, pravachol 40mg  half tablet daily and denies myalgias. His cholesterol is not at goal. The cholesterol last visit was:   Lab Results  Component Value Date   CHOL 127 08/09/2019   HDL 35 (L) 08/09/2019   LDLCALC 71 08/09/2019   TRIG 124 08/09/2019   CHOLHDL 3.6 08/09/2019   He has not been working on diet and exercise for prediabetes, he is on bASA, he is not on ACE/ARB and denies hyperglycemia, hypoglycemia , nausea, polydipsia and polyuria.  Last A1C in the office was:  Lab Results  Component Value Date   HGBA1C 5.4 05/03/2019   Last GFR:  Lab Results  Component Value Date   GFRAA 102 08/09/2019   Patient is on Vitamin D supplement.   Lab Results  Component Value Date   VD25OH 51 05/03/2019     Last PSA was: Lab Results  Component Value Date    PSA 3.7 05/03/2019    Current Medications:  Current Outpatient Medications on File Prior to Visit  Medication Sig Dispense Refill  . acyclovir (ZOVIRAX) 800 MG tablet Take 1 tablet Daily for Fever Blisters & Cold Sores 90 tablet 3  . allopurinol (ZYLOPRIM) 300 MG tablet Take 1 tablet Daily to Prevent Gout 90 tablet 1  . alum & mag hydroxide-simeth (MAALOX/MYLANTA) 200-200-20 MG/5ML suspension Take 30 mLs by mouth 2 (two) times daily.    Marland Kitchen aspirin EC 81 MG tablet Take 81 mg by mouth daily.    Marland Kitchen atenolol (TENORMIN) 100 MG tablet Take 1 tablet Daily for BP 90 tablet 1  . B Complex-C (SUPER B COMPLEX PO) Take 1 tablet by mouth daily.    . Cholecalciferol (VITAMIN D) 2000 UNITS CAPS Take 4,000 Units by mouth daily.    . Flaxseed, Linseed, (FLAX SEED OIL) 1000 MG CAPS Take 1,000 mg by mouth daily.    . fluticasone (FLONASE) 50 MCG/ACT nasal spray Place 2 sprays into both nostrils daily. 48 g 3  . glucose blood (FREESTYLE TEST STRIPS) test strip Use as instructed, DX 250.00 100 each 12  . hydrochlorothiazide (HYDRODIURIL) 25 MG tablet Take 1 tablet Daily for BP & Fluid Retention / Ankle Swelling 90 tablet 3  . Lancets (FREESTYLE) lancets   12  . loratadine (EQ ALLERGY RELIEF) 10 MG tablet TAKE 1 TABLET BY MOUTH DAILY FOR ALLERGIES 90 tablet 3  . Omega-3 Fatty Acids (FISH OIL) 1000 MG CAPS Take 1,000 mg by mouth daily.    . pravastatin (PRAVACHOL) 40 MG tablet Take 0.5 tablets (20 mg total) by mouth daily. 90 tablet 0  . tamsulosin (FLOMAX) 0.4 MG CAPS capsule Take 1 capsule (0.4 mg total) by mouth at bedtime. 90 capsule 1   No current facility-administered medications on file prior to visit.   Allergies:  Allergies  Allergen Reactions  . Ace Inhibitors     REACTION: cough  . Trilipix [Choline Fenofibrate] Other (See Comments)    Heart burn   Health Maintenance:  Immunization History  Administered Date(s) Administered  . PPD Test 02/13/2014  . Pneumococcal Polysaccharide-23 02/23/2002   . Tdap 05/15/2009   Tetanus: 2011 will wait until after COVID vaccine Pneumovax: 2004 N/A Prevnar 13: N/A Flu vaccine: 2020, at work Zostavax: Due dicussed   HIV : screening 2018 non-reactive  Colonoscopy: 2018, Due 2021 Polyps EGD: 2008 Eye Exam: 2019, Due for 2020 Dentist: 2019, Due for 2020  Patient Care Team: Unk Pinto, MD as PCP - General (Internal Medicine) Armbruster, Carlota Raspberry, MD as Consulting Physician (Gastroenterology)  Medical History:  has Hyperlipidemia; Essential hypertension; GERD; Vitamin D deficiency; Other abnormal glucose (hx of prediabetes); Medication management; Overweight (BMI 25.0-29.9); and Gout on their problem list. Surgical History:  He  has a past surgical history that includes Back surgery (2012); Laparoscopic inguinal hernia with umbilical hernia (Right, 08/15/2013); Insertion of mesh (N/A, 08/15/2013); and Hernia repair. Family History:  His family history includes Colon cancer (age of onset: 55) in his brother; Diabetes in his mother; Hypertension in his brother, brother, brother,  and mother. Social History:   reports that he quit smoking about 17 years ago. He has never used smokeless tobacco. He reports that he does not drink alcohol and does not use drugs. Review of Systems:  Review of Systems  Constitutional: Negative.   HENT: Negative.   Eyes: Negative.   Respiratory: Negative.   Cardiovascular: Negative.   Gastrointestinal: Negative.   Genitourinary: Negative.   Musculoskeletal: Negative.   Skin: Negative.     Physical Exam: Estimated body mass index is 30.14 kg/m as calculated from the following:   Height as of this encounter: 5\' 8"  (1.727 m).   Weight as of this encounter: 198 lb 3.2 oz (89.9 kg). BP 138/90   Pulse (!) 54   Temp (!) 97.3 F (36.3 C)   Ht 5\' 8"  (1.727 m)   Wt 198 lb 3.2 oz (89.9 kg)   SpO2 95%   BMI 30.14 kg/m  General Appearance: Well nourished, in no apparent distress.  Eyes: PERRLA, EOMs,  conjunctiva no swelling or erythema, normal fundi and vessels.  Sinuses: No Frontal/maxillary tenderness  ENT/Mouth: Ext aud canals clear, normal light reflex with TMs without erythema, bulging. Good dentition. No erythema, swelling, or exudate on post pharynx. Tonsils not swollen or erythematous. Hearing normal.  Neck: Supple, thyroid normal. No bruits  Respiratory: Respiratory effort normal, BS equal bilaterally without rales, rhonchi, wheezing or stridor.  Cardio: RRR without murmurs, rubs or gallops. Brisk peripheral pulses without edema.  Chest: symmetric, with normal excursions and percussion.  Abdomen: Soft, nontender, no guarding, rebound, hernias, masses, or organomegaly.  Lymphatics: Non tender without lymphadenopathy.  Musculoskeletal: Full ROM all peripheral extremities,5/5 strength, and normal gait.  Skin: Warm, dry without rashes, lesions, ecchymosis. Neuro: Cranial nerves intact, reflexes equal bilaterally. Normal muscle tone, no cerebellar symptoms. Sensation intact.  Psych: Awake and oriented X 3, normal affect, Insight and Judgment appropriate.   Garnet Sierras 5:45 PM [facility]

## 2019-08-10 ENCOUNTER — Other Ambulatory Visit: Payer: Self-pay

## 2019-08-10 ENCOUNTER — Other Ambulatory Visit: Payer: BC Managed Care – PPO

## 2019-08-10 DIAGNOSIS — Z202 Contact with and (suspected) exposure to infections with a predominantly sexual mode of transmission: Secondary | ICD-10-CM

## 2019-08-10 LAB — LIPID PANEL
Cholesterol: 127 mg/dL (ref <200)
HDL: 35 mg/dL — ABNORMAL LOW (ref >=40)
LDL Cholesterol (Calc): 71 mg/dL (calc)
Non-HDL Cholesterol (Calc): 92 mg/dL (calc) (ref <130)
Total CHOL/HDL Ratio: 3.6 (calc) (ref <5.0)
Triglycerides: 124 mg/dL (ref <150)

## 2019-08-10 LAB — COMPLETE METABOLIC PANEL WITH GFR
AG Ratio: 1.8 (calc) (ref 1.0–2.5)
ALT: 12 U/L (ref 9–46)
AST: 19 U/L (ref 10–35)
Albumin: 4.1 g/dL (ref 3.6–5.1)
Alkaline phosphatase (APISO): 69 U/L (ref 35–144)
BUN: 12 mg/dL (ref 7–25)
CO2: 27 mmol/L (ref 20–32)
Calcium: 8.8 mg/dL (ref 8.6–10.3)
Chloride: 106 mmol/L (ref 98–110)
Creat: 0.92 mg/dL (ref 0.70–1.25)
GFR, Est African American: 102 mL/min/{1.73_m2} (ref >=60)
GFR, Est Non African American: 88 mL/min/{1.73_m2} (ref >=60)
Globulin: 2.3 g/dL (calc) (ref 1.9–3.7)
Glucose, Bld: 109 mg/dL — ABNORMAL HIGH (ref 65–99)
Potassium: 3.3 mmol/L — ABNORMAL LOW (ref 3.5–5.3)
Sodium: 141 mmol/L (ref 135–146)
Total Bilirubin: 0.6 mg/dL (ref 0.2–1.2)
Total Protein: 6.4 g/dL (ref 6.1–8.1)

## 2019-08-10 LAB — CBC WITH DIFFERENTIAL/PLATELET
Absolute Monocytes: 657 cells/uL (ref 200–950)
Basophils Absolute: 37 cells/uL (ref 0–200)
Basophils Relative: 0.6 %
Eosinophils Absolute: 211 cells/uL (ref 15–500)
Eosinophils Relative: 3.4 %
HCT: 40.3 % (ref 38.5–50.0)
Hemoglobin: 13.8 g/dL (ref 13.2–17.1)
Lymphs Abs: 2548 cells/uL (ref 850–3900)
MCH: 31.9 pg (ref 27.0–33.0)
MCHC: 34.2 g/dL (ref 32.0–36.0)
MCV: 93.1 fL (ref 80.0–100.0)
MPV: 11.9 fL (ref 7.5–12.5)
Monocytes Relative: 10.6 %
Neutro Abs: 2747 cells/uL (ref 1500–7800)
Neutrophils Relative %: 44.3 %
Platelets: 129 10*3/uL — ABNORMAL LOW (ref 140–400)
RBC: 4.33 10*6/uL (ref 4.20–5.80)
RDW: 13 % (ref 11.0–15.0)
Total Lymphocyte: 41.1 %
WBC: 6.2 10*3/uL (ref 3.8–10.8)

## 2019-08-11 ENCOUNTER — Other Ambulatory Visit: Payer: Self-pay | Admitting: Internal Medicine

## 2019-08-11 ENCOUNTER — Telehealth: Payer: Self-pay | Admitting: *Deleted

## 2019-08-11 LAB — C. TRACHOMATIS/N. GONORRHOEAE RNA
C. trachomatis RNA, TMA: NOT DETECTED
N. gonorrhoeae RNA, TMA: NOT DETECTED

## 2019-08-11 MED ORDER — DOXYCYCLINE HYCLATE 100 MG PO CAPS: ORAL_CAPSULE | ORAL | 0 refills | Status: DC

## 2019-08-11 NOTE — Telephone Encounter (Signed)
Patient called and complained of swollen testicles and requested an Rx. Dr Melford Aase sent in an RX for Doxycycline 100 mg capsule 2 capsules daily x 2 weeks. Patient was advised to sit in a tub of warm water 2 to 3 times daily for 15 to 30 minutes each time. He was advised to call the office in 1 week if problem is not improved. Patient is aware of all.

## 2019-09-06 ENCOUNTER — Other Ambulatory Visit: Payer: Self-pay

## 2019-09-06 ENCOUNTER — Ambulatory Visit (AMBULATORY_SURGERY_CENTER): Payer: Self-pay | Admitting: *Deleted

## 2019-09-06 VITALS — Ht 68.0 in | Wt 193.0 lb

## 2019-09-06 DIAGNOSIS — Z86010 Personal history of colonic polyps: Secondary | ICD-10-CM

## 2019-09-06 MED ORDER — SUTAB 1479-225-188 MG PO TABS: 1.0000 | ORAL_TABLET | ORAL | 0 refills | Status: DC

## 2019-09-06 NOTE — Progress Notes (Signed)
Patient is here in-person for PV. Patient denies any allergies to eggs or soy. Patient denies any problems with anesthesia/sedation. Patient denies any oxygen use at home. Patient denies taking any diet/weight loss medications or blood thinners. Patient is not being treated for MRSA or C-diff. Patient is aware of our care-partner policy and XWNPI-09 safety protocol.  COVID-19 vaccines completed on 05/31/2019.

## 2019-09-07 ENCOUNTER — Encounter: Payer: Self-pay | Admitting: Gastroenterology

## 2019-09-20 ENCOUNTER — Ambulatory Visit (AMBULATORY_SURGERY_CENTER): Payer: BC Managed Care – PPO | Admitting: Gastroenterology

## 2019-09-20 ENCOUNTER — Encounter: Payer: Self-pay | Admitting: Gastroenterology

## 2019-09-20 ENCOUNTER — Other Ambulatory Visit: Payer: Self-pay

## 2019-09-20 VITALS — BP 122/83 | HR 49 | Temp 97.6°F | Resp 15 | Ht 68.0 in | Wt 193.0 lb

## 2019-09-20 DIAGNOSIS — D122 Benign neoplasm of ascending colon: Secondary | ICD-10-CM | POA: Diagnosis not present

## 2019-09-20 DIAGNOSIS — Z1211 Encounter for screening for malignant neoplasm of colon: Secondary | ICD-10-CM | POA: Diagnosis not present

## 2019-09-20 DIAGNOSIS — D125 Benign neoplasm of sigmoid colon: Secondary | ICD-10-CM | POA: Diagnosis not present

## 2019-09-20 DIAGNOSIS — D123 Benign neoplasm of transverse colon: Secondary | ICD-10-CM | POA: Diagnosis not present

## 2019-09-20 DIAGNOSIS — Z86010 Personal history of colonic polyps: Secondary | ICD-10-CM | POA: Diagnosis not present

## 2019-09-20 MED ORDER — SODIUM CHLORIDE 0.9 % IV SOLN: 500.0000 mL | Freq: Once | INTRAVENOUS | Status: DC

## 2019-09-20 NOTE — Patient Instructions (Signed)
Impression/Recommendations:  Polyp handout given to patient. Diveticuolosis handout given to patient. Hemorrhoid handout given to patient.  Resume previous diet. Continue present medications. Await pathology results.  YOU HAD AN ENDOSCOPIC PROCEDURE TODAY AT [facility]:   Refer to the procedure report that was given to you for any specific questions about what was found during the examination.  If the procedure report does not answer your questions, please call your gastroenterologist to clarify.  If you requested that your care partner not be given the details of your procedure findings, then the procedure report has been included in a sealed envelope for you to review at your convenience later.  YOU SHOULD EXPECT: Some feelings of bloating in the abdomen. Passage of more gas than usual.  Walking can help get rid of the air that was put into your GI tract during the procedure and reduce the bloating. If you had a lower endoscopy (such as a colonoscopy or flexible sigmoidoscopy) you may notice spotting of blood in your stool or on the toilet paper. If you underwent a bowel prep for your procedure, you may not have a normal bowel movement for a few days.  Please Note:  You might notice some irritation and congestion in your nose or some drainage.  This is from the oxygen used during your procedure.  There is no need for concern and it should clear up in a day or so.  SYMPTOMS TO REPORT IMMEDIATELY:   Following lower endoscopy (colonoscopy or flexible sigmoidoscopy):  Excessive amounts of blood in the stool  Significant tenderness or worsening of abdominal pains  Swelling of the abdomen that is new, acute  Fever of 100F or higher For urgent or emergent issues, a gastroenterologist can be reached at any hour by calling [phone]. Do not use MyChart messaging for urgent concerns.    DIET:  We do recommend a small meal at first, but then you may proceed to your regular  diet.  Drink plenty of fluids but you should avoid alcoholic beverages for 24 hours.  ACTIVITY:  You should plan to take it easy for the rest of today and you should NOT DRIVE or use heavy machinery until tomorrow (because of the sedation medicines used during the test).    FOLLOW UP: Our staff will call the number listed on your records 48-72 hours following your procedure to check on you and address any questions or concerns that you may have regarding the information given to you following your procedure. If we do not reach you, we will leave a message.  We will attempt to reach you two times.  During this call, we will ask if you have developed any symptoms of COVID 19. If you develop any symptoms (ie: fever, flu-like symptoms, shortness of breath, cough etc.) before then, please call [phone].  If you test positive for Covid 19 in the 2 weeks post procedure, please call and report this information to [place].    If any biopsies were taken you will be contacted by phone or by letter within the next 1-3 weeks.  Please call us at [phone] if you have not heard about the biopsies in 3 weeks.    SIGNATURES/CONFIDENTIALITY: You and/or your care partner have signed paperwork which will be entered into your electronic medical record.  These signatures attest to the fact that that the information above on your After Visit Summary has been reviewed and is understood.  Full responsibility of the confidentiality of this  discharge information lies with you and/or your care-partner.

## 2019-09-20 NOTE — Op Note (Signed)
[facility] Patient Name: Caleb Johns Procedure Date: 09/20/2019 9:42 AM MRN: [id number] Endoscopist: Remo Lipps P. Havery Moros , MD Age: 64 Referring MD:  Date of Birth: [birth year] Gender: Male Account #: [id number] Procedure:                Colonoscopy Indications:              Surveillance: Personal history of adenomatous                            polyps on last colonoscopy 3 years ago (multiple                            adenomas largest 38mm) Medicines:                Monitored Anesthesia Care Procedure:                Pre-Anesthesia Assessment:                           - Prior to the procedure, a History and Physical                            was performed, and patient medications and                            allergies were reviewed. The patient's tolerance of                            previous anesthesia was also reviewed. The risks                            and benefits of the procedure and the sedation                            options and risks were discussed with the patient.                            All questions were answered, and informed consent                            was obtained. Prior Anticoagulants: The patient has                            taken no previous anticoagulant or antiplatelet                            agents. ASA Grade Assessment: II - A patient with                            mild systemic disease. After reviewing the risks                            and benefits, the patient was deemed in  satisfactory condition to undergo the procedure.                           After obtaining informed consent, the colonoscope                            was passed under direct vision. Throughout the                            procedure, the patient's blood pressure, pulse, and                            oxygen saturations were monitored continuously. The                            Colonoscope was introduced through the  anus and                            advanced to the the cecum, identified by                            appendiceal orifice and ileocecal valve. The                            colonoscopy was performed without difficulty. The                            patient tolerated the procedure well. The quality                            of the bowel preparation was good. The ileocecal                            valve, appendiceal orifice, and rectum were                            photographed. Scope In: 9:43:48 AM Scope Out: 10:06:52 AM Scope Withdrawal Time: 0 hours 18 minutes 20 seconds  Total Procedure Duration: 0 hours 23 minutes 4 seconds  Findings:                 The perianal and digital rectal examinations were                            normal.                           Two sessile polyps were found in the ascending                            colon. The polyps were 2 to 4 mm in size. These                            polyps were removed with a cold snare. Resection  and retrieval were complete.                           A 3 mm polyp was found in the transverse colon. The                            polyp was sessile. The polyp was removed with a                            cold snare. Resection and retrieval were complete.                           A 3 mm polyp was found in the sigmoid colon. The                            polyp was sessile. The polyp was removed with a                            cold snare. Resection and retrieval were complete.                           A few small-mouthed diverticula were found in the                            sigmoid colon.                           Internal hemorrhoids were found during retroflexion.                           The exam was otherwise without abnormality. Complications:            No immediate complications. Estimated blood loss:                            Minimal. Estimated Blood Loss:     Estimated blood  loss was minimal. Impression:               - Two 2 to 4 mm polyps in the ascending colon,                            removed with a cold snare. Resected and retrieved.                           - One 3 mm polyp in the transverse colon, removed                            with a cold snare. Resected and retrieved.                           - One 3 mm polyp in the sigmoid colon, removed with                            a  cold snare. Resected and retrieved.                           - Diverticulosis in the sigmoid colon.                           - Internal hemorrhoids.                           - The examination was otherwise normal. Recommendation:           - Patient has a contact number available for                            emergencies. The signs and symptoms of potential                            delayed complications were discussed with the                            patient. Return to normal activities tomorrow.                            Written discharge instructions were provided to the                            patient.                           - Resume previous diet.                           - Continue present medications.                           - Await pathology results. Remo Lipps P. Eddie Payette, MD 09/20/2019 10:11:02 AM This report has been signed electronically.

## 2019-09-20 NOTE — Progress Notes (Signed)
Cw vitals, MO IV.

## 2019-09-20 NOTE — Progress Notes (Signed)
pt tolerated well. VSS. awake and to recovery. Report given to RN.  

## 2019-09-22 ENCOUNTER — Telehealth: Payer: Self-pay | Admitting: *Deleted

## 2019-09-22 NOTE — Telephone Encounter (Signed)
1. Have you developed a fever since your procedure? no  2.   Have you had an respiratory symptoms (SOB or cough) since your procedure? no  3.   Have you tested positive for COVID 19 since your procedure no  4.   Have you had any family members/close contacts diagnosed with the COVID 19 since your procedure?  no   If yes to any of these questions please route to Joylene John, RN and Erenest Rasher, RN Follow up Call-  Call back number 09/20/2019  Post procedure Call Back phone  # [phone]  Permission to leave phone message Yes  Some recent data might be hidden     Patient questions:  Do you have a fever, pain , or abdominal swelling? No. Pain Score  0 *  Have you tolerated food without any problems? Yes.    Have you been able to return to your normal activities? Yes.    Do you have any questions about your discharge instructions: Diet   No. Medications  No. Follow up visit  No.  Do you have questions or concerns about your Care? No.  Actions: * If pain score is 4 or above: No action needed, pain <4.

## 2019-09-26 ENCOUNTER — Encounter: Payer: Self-pay | Admitting: Gastroenterology

## 2019-11-06 ENCOUNTER — Other Ambulatory Visit: Payer: Self-pay | Admitting: Internal Medicine

## 2019-11-06 DIAGNOSIS — I10 Essential (primary) hypertension: Secondary | ICD-10-CM

## 2019-11-15 ENCOUNTER — Telehealth: Payer: Self-pay | Admitting: Adult Health Nurse Practitioner

## 2019-11-15 NOTE — Telephone Encounter (Signed)
patient requests Viagra called to [facility].

## 2019-11-17 ENCOUNTER — Other Ambulatory Visit: Payer: Self-pay | Admitting: Internal Medicine

## 2019-11-17 DIAGNOSIS — N529 Male erectile dysfunction, unspecified: Secondary | ICD-10-CM

## 2019-11-17 MED ORDER — SILDENAFIL CITRATE 100 MG PO TABS: ORAL_TABLET | ORAL | 0 refills | Status: DC

## 2019-12-26 ENCOUNTER — Other Ambulatory Visit: Payer: Self-pay | Admitting: Adult Health

## 2019-12-26 ENCOUNTER — Other Ambulatory Visit: Payer: Self-pay | Admitting: Internal Medicine

## 2019-12-26 MED ORDER — PRAVASTATIN SODIUM 20 MG PO TABS: ORAL_TABLET | ORAL | 0 refills | Status: DC

## 2020-01-15 ENCOUNTER — Other Ambulatory Visit: Payer: Self-pay | Admitting: Internal Medicine

## 2020-01-15 DIAGNOSIS — N529 Male erectile dysfunction, unspecified: Secondary | ICD-10-CM

## 2020-01-15 MED ORDER — SILDENAFIL CITRATE 100 MG PO TABS: ORAL_TABLET | ORAL | 0 refills | Status: DC

## 2020-01-16 ENCOUNTER — Other Ambulatory Visit: Payer: Self-pay | Admitting: Adult Health

## 2020-02-03 ENCOUNTER — Other Ambulatory Visit: Payer: Self-pay | Admitting: Adult Health

## 2020-04-09 ENCOUNTER — Other Ambulatory Visit: Payer: Self-pay | Admitting: Internal Medicine

## 2020-04-09 DIAGNOSIS — J302 Other seasonal allergic rhinitis: Secondary | ICD-10-CM

## 2020-04-09 DIAGNOSIS — I10 Essential (primary) hypertension: Secondary | ICD-10-CM

## 2020-04-09 DIAGNOSIS — B009 Herpesviral infection, unspecified: Secondary | ICD-10-CM

## 2020-05-02 ENCOUNTER — Encounter: Payer: Self-pay | Admitting: Adult Health Nurse Practitioner

## 2020-05-02 ENCOUNTER — Ambulatory Visit: Payer: BC Managed Care – PPO | Admitting: Adult Health Nurse Practitioner

## 2020-05-02 ENCOUNTER — Other Ambulatory Visit: Payer: Self-pay

## 2020-05-02 VITALS — BP 110/68 | HR 52 | Temp 97.3°F | Ht 68.0 in | Wt 198.0 lb

## 2020-05-02 DIAGNOSIS — Z1321 Encounter for screening for nutritional disorder: Secondary | ICD-10-CM

## 2020-05-02 DIAGNOSIS — Z79899 Other long term (current) drug therapy: Secondary | ICD-10-CM | POA: Diagnosis not present

## 2020-05-02 DIAGNOSIS — N401 Enlarged prostate with lower urinary tract symptoms: Secondary | ICD-10-CM | POA: Diagnosis not present

## 2020-05-02 DIAGNOSIS — Z125 Encounter for screening for malignant neoplasm of prostate: Secondary | ICD-10-CM | POA: Diagnosis not present

## 2020-05-02 DIAGNOSIS — I10 Essential (primary) hypertension: Secondary | ICD-10-CM

## 2020-05-02 DIAGNOSIS — Z6831 Body mass index (BMI) 31.0-31.9, adult: Secondary | ICD-10-CM

## 2020-05-02 DIAGNOSIS — Z113 Encounter for screening for infections with a predominantly sexual mode of transmission: Secondary | ICD-10-CM | POA: Diagnosis not present

## 2020-05-02 DIAGNOSIS — E663 Overweight: Secondary | ICD-10-CM

## 2020-05-02 DIAGNOSIS — Z130 Encounter for screening for diseases of the blood and blood-forming organs and certain disorders involving the immune mechanism: Secondary | ICD-10-CM

## 2020-05-02 DIAGNOSIS — E782 Mixed hyperlipidemia: Secondary | ICD-10-CM

## 2020-05-02 DIAGNOSIS — Z1329 Encounter for screening for other suspected endocrine disorder: Secondary | ICD-10-CM

## 2020-05-02 DIAGNOSIS — Z0001 Encounter for general adult medical examination with abnormal findings: Secondary | ICD-10-CM

## 2020-05-02 DIAGNOSIS — Z1389 Encounter for screening for other disorder: Secondary | ICD-10-CM

## 2020-05-02 DIAGNOSIS — M5441 Lumbago with sciatica, right side: Secondary | ICD-10-CM

## 2020-05-02 DIAGNOSIS — Z23 Encounter for immunization: Secondary | ICD-10-CM | POA: Diagnosis not present

## 2020-05-02 DIAGNOSIS — R7309 Other abnormal glucose: Secondary | ICD-10-CM

## 2020-05-02 DIAGNOSIS — E559 Vitamin D deficiency, unspecified: Secondary | ICD-10-CM | POA: Diagnosis not present

## 2020-05-02 DIAGNOSIS — Z0000 Encounter for general adult medical examination without abnormal findings: Secondary | ICD-10-CM | POA: Diagnosis not present

## 2020-05-02 DIAGNOSIS — Z136 Encounter for screening for cardiovascular disorders: Secondary | ICD-10-CM | POA: Diagnosis not present

## 2020-05-02 DIAGNOSIS — R350 Frequency of micturition: Secondary | ICD-10-CM | POA: Diagnosis not present

## 2020-05-02 DIAGNOSIS — N529 Male erectile dysfunction, unspecified: Secondary | ICD-10-CM

## 2020-05-02 DIAGNOSIS — Z131 Encounter for screening for diabetes mellitus: Secondary | ICD-10-CM

## 2020-05-02 DIAGNOSIS — Z13220 Encounter for screening for lipoid disorders: Secondary | ICD-10-CM | POA: Diagnosis not present

## 2020-05-02 DIAGNOSIS — M1A00X0 Idiopathic chronic gout, unspecified site, without tophus (tophi): Secondary | ICD-10-CM

## 2020-05-02 DIAGNOSIS — K219 Gastro-esophageal reflux disease without esophagitis: Secondary | ICD-10-CM

## 2020-05-02 MED ORDER — FAMOTIDINE 40 MG PO TABS: 40.0000 mg | ORAL_TABLET | Freq: Every evening | ORAL | 2 refills | Status: AC

## 2020-05-02 MED ORDER — GABAPENTIN 300 MG PO CAPS: 300.0000 mg | ORAL_CAPSULE | Freq: Two times a day (BID) | ORAL | 2 refills | Status: DC | PRN

## 2020-05-02 NOTE — Progress Notes (Signed)
Complete Physical  Assessment and Plan:  Encounter for general adult medical examination with abnormal findings Yearly Tdap received today  Essential hypertension -     CBC with Differential/Platelet -     COMPLETE METABOLIC PANEL WITH GFR -     TSH -     Urinalysis, Routine w reflex microscopic -     Microalbumin / creatinine urine ratio -     EKG 12-Lead- declines, has to take him mom to the doctor -     DG Chest 2 View; Future - continue medications, DASH diet, exercise and monitor at home. Call if greater than 130/80.   Hyperlipidemia -     Lipid panel check lipids decrease fatty foods increase activity.   Other abnormal glucose (hx of prediabetes) -     Hemoglobin A1c Discussed disease progression and risks Discussed diet/exercise, weight management and risk modification  Idiopathic chronic gout without tophus, unspecified site Gout- recheck Uric acid as needed, Diet discussed, continue medications.  Medication management -     Magnesium  Vitamin D deficiency -     VITAMIN D 25 Hydroxy (Vit-D Deficiency, Fractures)  Gastroesophageal reflux disease, unspecified whether esophagitis present -     famotidine (PEPCID) 40 MG tablet; Take 1 tablet (40 mg total) by mouth every evening.  Overweight (BMI 25.0-29.9) -     Testosterone  Screening PSA (prostate specific antigen) -     PSA  Screening, ischemic heart disease -     EKG 12-Lead  Screening for blood or protein in urine -     Urinalysis w microscopic + reflex cultur  Encounter for vitamin deficiency screening -     Vitamin B12  Acute right-sided low back pain with right-sided sciatica -     gabapentin (NEURONTIN) 300 MG capsule; Take 1 capsule (300 mg total) by mouth 2 (two) times daily as needed.  Screening examination for STD (sexually transmitted disease) -     HIV Antibody (routine testing w rflx) -     RPR -     C. trachomatis/N. gonorrhoeae RNA  Need for Tdap vaccination -     Tdap vaccine  greater than or equal to 7yo IM   Discussed med's effects and SE's. Screening labs and tests as requested with regular follow-up as recommended. Over 40 minutes of exam, counseling, chart review and critical decision making was performed  HPI Patient presents for a complete physical.  Reports he has not had a gout flare in a long time.    His blood pressure has been controlled at home, today their BP is BP: 110/68   He  GERD but this has been recalled. He has been having reflux, not daily, varies. No ETOH. He takes aleve as needed for body aches. No black stool, no blood in the stool.   He states his hands and feet have been bothering him.   He does not workout BUT HE IS getting back into mowing grass.  He denies chest pain, shortness of breath, dizziness.  He is on cholesterol medication and denies myalgias. His cholesterol is not at goal. The cholesterol last visit was:   Lab Results  Component Value Date   CHOL 127 08/09/2019   HDL 35 (L) 08/09/2019   LDLCALC 71 08/09/2019   TRIG 124 08/09/2019   CHOLHDL 3.6 08/09/2019   He has not been working on diet and exercise for prediabetes, he is on bASA, he is not on ACE/ARB and denies hyperglycemia, hypoglycemia , nausea, polydipsia  and polyuria. Last A1C in the office was:  Lab Results  Component Value Date   HGBA1C 5.4 05/03/2019   Last GFR:  Lab Results  Component Value Date   GFRAA 102 08/09/2019   Patient is on Vitamin D supplement.   Lab Results  Component Value Date   VD25OH 51 05/03/2019     Last PSA was: Lab Results  Component Value Date   PSA 3.7 05/03/2019    Current Medications:  Current Outpatient Medications on File Prior to Visit  Medication Sig Dispense Refill  . allopurinol (ZYLOPRIM) 300 MG tablet Take 1 tablet Daily to Prevent Gout 90 tablet 1  . alum & mag hydroxide-simeth (MAALOX/MYLANTA) 200-200-20 MG/5ML suspension Take 30 mLs by mouth 2 (two) times daily.    Marland Kitchen aspirin EC 81 MG tablet Take 81 mg  by mouth daily.    Marland Kitchen atenolol (TENORMIN) 100 MG tablet Take 1 tablet by mouth once daily for blood pressure 90 tablet 0  . B Complex-C (SUPER B COMPLEX PO) Take 1 tablet by mouth daily.    . Cholecalciferol (VITAMIN D) 2000 UNITS CAPS Take 4,000 Units by mouth daily.    . cyclobenzaprine (FLEXERIL) 10 MG tablet Take 10 mg by mouth at bedtime as needed.    . famotidine (PEPCID) 40 MG tablet Take 1 tablet (40 mg total) by mouth every evening. 90 tablet 1  . Flaxseed, Linseed, (FLAX SEED OIL) 1000 MG CAPS Take 1,000 mg by mouth daily.    . fluticasone (FLONASE) 50 MCG/ACT nasal spray Place 2 sprays into both nostrils daily. 48 g 3  . glucose blood (FREESTYLE TEST STRIPS) test strip Use as instructed, DX 250.00 100 each 12  . hydrochlorothiazide (HYDRODIURIL) 25 MG tablet TAKE 1 TABLET BY MOUTH ONCE DAILY FOR BLOOD PRESSURE AND  FLUID  RETENTION/ANKLE  SWELLING 90 tablet 0  . Lancets (FREESTYLE) lancets   12  . loratadine (EQ LORATADINE) 10 MG tablet TAKE 1 TABLET BY MOUTH DAILY FOR ALLERGIES 90 tablet 0  . Omega-3 Fatty Acids (FISH OIL) 1000 MG CAPS Take 1,000 mg by mouth daily.    . pravastatin (PRAVACHOL) 20 MG tablet Take     1 tablet       at Bedtime        for Cholesterol 90 tablet 0  . sildenafil (VIAGRA) 100 MG tablet Take 1/2 to 1 tablet /day  ONLY if needed for XXXX 30 tablet 0  . acyclovir (ZOVIRAX) 800 MG tablet TAKE ONE TABLET BY MOUTH DAILY FOR FEVER BLISTERS AND COLD SORES (Patient not taking: Reported on 05/02/2020) 90 tablet 0   No current facility-administered medications on file prior to visit.   Allergies:  Allergies  Allergen Reactions  . Ace Inhibitors     REACTION: cough  . Trilipix [Choline Fenofibrate] Other (See Comments)    Heart burn   Health Maintenance:  Immunization History  Administered Date(s) Administered  . PPD Test 02/13/2014  . Pneumococcal Polysaccharide-23 02/23/2002  . Tdap 05/15/2009   Tetanus: Received today 04/2020 Pneumovax: 2004 N/A Prevnar  13: N/A Flu vaccine: 2020, at work Zostavax: Due dicussed  [place] 3/21, 4/21 Booster 02/26/20 HIV : screening 2018 non-reactive  Colonoscopy:08/2019, Q5 years One 3 mm polyp in the sigmoid colon, removed with a cold snare. Resected and retrieved. - Diverticulosis in the sigmoid colon. - Internal hemorrhoids. Pathology: adenomatous  EGD: 2008 Eye Exam: 2019, Due for 2022 Dentist: 2019, Due for 2022  Patient Care Team: Unk Pinto, MD as  PCP - General (Internal Medicine) Armbruster, Carlota Raspberry, MD as Consulting Physician (Gastroenterology)  Medical History:  has Hyperlipidemia; Essential hypertension; GERD; Vitamin D deficiency; Other abnormal glucose (hx of prediabetes); Medication management; Overweight (BMI 25.0-29.9); and Gout on their problem list. Surgical History:  He  has a past surgical history that includes Back surgery (2012); Laparoscopic inguinal hernia with umbilical hernia (Right, 08/15/2013); Insertion of mesh (N/A, 08/15/2013); and Hernia repair. Family History:  His family history includes Colon cancer (age of onset: 9) in his brother; Diabetes in his mother; Hypertension in his brother, brother, brother, and mother. Social History:   reports that he quit smoking about 18 years ago. He has never used smokeless tobacco. He reports that he does not drink alcohol and does not use drugs.   Review of Systems:  Review of Systems  Constitutional: Negative.   HENT: Negative.   Eyes: Negative.   Respiratory: Negative.   Cardiovascular: Negative.   Gastrointestinal: Negative.   Genitourinary: Negative.   Musculoskeletal: Negative.   Skin: Negative.     Physical Exam: Estimated body mass index is 30.11 kg/m as calculated from the following:   Height as of this encounter: 5\' 8"  (1.727 m).   Weight as of this encounter: 198 lb (89.8 kg). BP 110/68   Pulse (!) 52   Temp (!) 97.3 F (36.3 C)   Ht 5\' 8"  (1.727 m)   Wt 198 lb (89.8 kg)   SpO2 96%   BMI  30.11 kg/m    General Appearance: Well nourished, in no apparent distress.  Eyes: PERRLA, EOMs, conjunctiva no swelling or erythema, normal fundi and vessels.  Sinuses: No Frontal/maxillary tenderness  ENT/Mouth: Ext aud canals clear, normal light reflex with TMs without erythema, bulging. Good dentition. No erythema, swelling, or exudate on post pharynx. Tonsils not swollen or erythematous. Hearing normal.  Neck: Supple, thyroid normal. No bruits  Respiratory: Respiratory effort normal, BS equal bilaterally without rales, rhonchi, wheezing or stridor.  Cardio: RRR without murmurs, rubs or gallops. Brisk peripheral pulses without edema.  Chest: symmetric, with normal excursions and percussion.  Abdomen: Soft, nontender, no guarding, rebound, hernias, masses, or organomegaly.  Lymphatics: Non tender without lymphadenopathy.  Genitourinary:  Musculoskeletal: Full ROM all peripheral extremities,5/5 strength, and normal gait.  Skin: Warm, dry without rashes, lesions, ecchymosis. Neuro: Cranial nerves intact, reflexes equal bilaterally. Normal muscle tone, no cerebellar symptoms. Sensation intact.  Psych: Awake and oriented X 3, normal affect, Insight and Judgment appropriate.    EKG:  No ST changes Comparable to previous  Garnet Sierras, NP 2:49 PM [facility]

## 2020-05-03 LAB — CBC WITH DIFFERENTIAL/PLATELET
Absolute Monocytes: 510 cells/uL (ref 200–950)
Basophils Absolute: 31 cells/uL (ref 0–200)
Basophils Relative: 0.6 %
Eosinophils Absolute: 296 cells/uL (ref 15–500)
Eosinophils Relative: 5.7 %
HCT: 42.5 % (ref 38.5–50.0)
Hemoglobin: 14.4 g/dL (ref 13.2–17.1)
Lymphs Abs: 2319 cells/uL (ref 850–3900)
MCH: 31.4 pg (ref 27.0–33.0)
MCHC: 33.9 g/dL (ref 32.0–36.0)
MCV: 92.6 fL (ref 80.0–100.0)
MPV: 11.3 fL (ref 7.5–12.5)
Monocytes Relative: 9.8 %
Neutro Abs: 2044 cells/uL (ref 1500–7800)
Neutrophils Relative %: 39.3 %
Platelets: 159 10*3/uL (ref 140–400)
RBC: 4.59 10*6/uL (ref 4.20–5.80)
RDW: 12.9 % (ref 11.0–15.0)
Total Lymphocyte: 44.6 %
WBC: 5.2 10*3/uL (ref 3.8–10.8)

## 2020-05-03 LAB — URINALYSIS W MICROSCOPIC + REFLEX CULTURE
Bacteria, UA: NONE SEEN /HPF
Bilirubin Urine: NEGATIVE
Glucose, UA: NEGATIVE
Hgb urine dipstick: NEGATIVE
Hyaline Cast: NONE SEEN /LPF
Ketones, ur: NEGATIVE
Leukocyte Esterase: NEGATIVE
Nitrites, Initial: NEGATIVE
Protein, ur: NEGATIVE
RBC / HPF: NONE SEEN /HPF (ref 0–2)
Specific Gravity, Urine: 1.011 (ref 1.001–1.03)
Squamous Epithelial / HPF: NONE SEEN /HPF (ref <=5)
WBC, UA: NONE SEEN /HPF (ref 0–5)
pH: 7.5 (ref 5.0–8.0)

## 2020-05-03 LAB — PSA: PSA: 1.7 ng/mL (ref <=4.0)

## 2020-05-03 LAB — COMPLETE METABOLIC PANEL WITH GFR
AG Ratio: 1.9 (calc) (ref 1.0–2.5)
ALT: 15 U/L (ref 9–46)
AST: 19 U/L (ref 10–35)
Albumin: 4.4 g/dL (ref 3.6–5.1)
Alkaline phosphatase (APISO): 77 U/L (ref 35–144)
BUN: 17 mg/dL (ref 7–25)
CO2: 27 mmol/L (ref 20–32)
Calcium: 9.5 mg/dL (ref 8.6–10.3)
Chloride: 102 mmol/L (ref 98–110)
Creat: 0.83 mg/dL (ref 0.70–1.25)
GFR, Est African American: 108 mL/min/{1.73_m2} (ref >=60)
GFR, Est Non African American: 93 mL/min/{1.73_m2} (ref >=60)
Globulin: 2.3 g/dL (calc) (ref 1.9–3.7)
Glucose, Bld: 93 mg/dL (ref 65–99)
Potassium: 3.4 mmol/L — ABNORMAL LOW (ref 3.5–5.3)
Sodium: 139 mmol/L (ref 135–146)
Total Bilirubin: 0.5 mg/dL (ref 0.2–1.2)
Total Protein: 6.7 g/dL (ref 6.1–8.1)

## 2020-05-03 LAB — HEMOGLOBIN A1C
Hgb A1c MFr Bld: 5.6 % of total Hgb (ref <5.7)
Mean Plasma Glucose: 114 mg/dL
eAG (mmol/L): 6.3 mmol/L

## 2020-05-03 LAB — LIPID PANEL
Cholesterol: 159 mg/dL (ref <200)
HDL: 35 mg/dL — ABNORMAL LOW (ref >=40)
LDL Cholesterol (Calc): 95 mg/dL (calc)
Non-HDL Cholesterol (Calc): 124 mg/dL (calc) (ref <130)
Total CHOL/HDL Ratio: 4.5 (calc) (ref <5.0)
Triglycerides: 194 mg/dL — ABNORMAL HIGH (ref <150)

## 2020-05-03 LAB — TSH: TSH: 1.27 mIU/L (ref 0.40–4.50)

## 2020-05-03 LAB — VITAMIN D 25 HYDROXY (VIT D DEFICIENCY, FRACTURES): Vit D, 25-Hydroxy: 51 ng/mL (ref 30–100)

## 2020-05-03 LAB — HIV ANTIBODY (ROUTINE TESTING W REFLEX): HIV 1&2 Ab, 4th Generation: NONREACTIVE

## 2020-05-03 LAB — VITAMIN B12: Vitamin B-12: 403 pg/mL (ref 200–1100)

## 2020-05-03 LAB — MAGNESIUM: Magnesium: 2.2 mg/dL (ref 1.5–2.5)

## 2020-05-03 LAB — NO CULTURE INDICATED

## 2020-05-08 DIAGNOSIS — E119 Type 2 diabetes mellitus without complications: Secondary | ICD-10-CM | POA: Diagnosis not present

## 2020-05-08 DIAGNOSIS — H0288A Meibomian gland dysfunction right eye, upper and lower eyelids: Secondary | ICD-10-CM | POA: Diagnosis not present

## 2020-05-08 DIAGNOSIS — H35033 Hypertensive retinopathy, bilateral: Secondary | ICD-10-CM | POA: Diagnosis not present

## 2020-05-08 DIAGNOSIS — H5212 Myopia, left eye: Secondary | ICD-10-CM | POA: Diagnosis not present

## 2020-05-08 DIAGNOSIS — H0288B Meibomian gland dysfunction left eye, upper and lower eyelids: Secondary | ICD-10-CM | POA: Diagnosis not present

## 2020-05-08 DIAGNOSIS — H52223 Regular astigmatism, bilateral: Secondary | ICD-10-CM | POA: Diagnosis not present

## 2020-05-08 DIAGNOSIS — H524 Presbyopia: Secondary | ICD-10-CM | POA: Diagnosis not present

## 2020-07-23 ENCOUNTER — Other Ambulatory Visit: Payer: Self-pay | Admitting: Adult Health Nurse Practitioner

## 2020-07-24 ENCOUNTER — Other Ambulatory Visit: Payer: Self-pay | Admitting: Internal Medicine

## 2020-07-24 DIAGNOSIS — I10 Essential (primary) hypertension: Secondary | ICD-10-CM

## 2020-07-24 MED ORDER — HYDROCHLOROTHIAZIDE 25 MG PO TABS: ORAL_TABLET | ORAL | 3 refills | Status: DC

## 2020-08-07 DIAGNOSIS — M79672 Pain in left foot: Secondary | ICD-10-CM | POA: Diagnosis not present

## 2020-08-29 ENCOUNTER — Other Ambulatory Visit: Payer: Self-pay

## 2020-08-29 ENCOUNTER — Ambulatory Visit (INDEPENDENT_AMBULATORY_CARE_PROVIDER_SITE_OTHER): Payer: BC Managed Care – PPO | Admitting: Ophthalmology

## 2020-08-29 ENCOUNTER — Encounter (INDEPENDENT_AMBULATORY_CARE_PROVIDER_SITE_OTHER): Payer: Self-pay | Admitting: Ophthalmology

## 2020-08-29 ENCOUNTER — Encounter (HOSPITAL_COMMUNITY): Payer: Self-pay | Admitting: Ophthalmology

## 2020-08-29 DIAGNOSIS — H25813 Combined forms of age-related cataract, bilateral: Secondary | ICD-10-CM | POA: Diagnosis not present

## 2020-08-29 DIAGNOSIS — H04123 Dry eye syndrome of bilateral lacrimal glands: Secondary | ICD-10-CM | POA: Diagnosis not present

## 2020-08-29 DIAGNOSIS — H3321 Serous retinal detachment, right eye: Secondary | ICD-10-CM

## 2020-08-29 DIAGNOSIS — H35033 Hypertensive retinopathy, bilateral: Secondary | ICD-10-CM

## 2020-08-29 DIAGNOSIS — I10 Essential (primary) hypertension: Secondary | ICD-10-CM

## 2020-08-29 DIAGNOSIS — H33021 Retinal detachment with multiple breaks, right eye: Secondary | ICD-10-CM | POA: Diagnosis not present

## 2020-08-29 DIAGNOSIS — H2513 Age-related nuclear cataract, bilateral: Secondary | ICD-10-CM | POA: Diagnosis not present

## 2020-08-29 NOTE — Progress Notes (Signed)
[facility] Note  08/29/2020     CHIEF COMPLAINT Patient presents for Retina Evaluation   HISTORY OF PRESENT ILLNESS: Caleb Johns is a 65 y.o. male who presents to the clinic today for:   HPI     Retina Evaluation   In right eye.  Duration of 7 hours.  I, the attending physician,  performed the HPI with the patient and updated documentation appropriately.        Comments   Pt referred by [facility], [facility]. Pt states he had loss of vision in his eye this morning, only seeing a crescent moon in OD. No floaters or flashes of light but he has had them before. Pt was recently diagnosed as diabetic, not currently medicated.       Last edited by Bernarda Caffey, MD on 08/29/2020  3:51 PM.     Patient states woke up this morning and "vision seemed weak" OD. Went to work and had difficulty seeing peripherally and decided to make an appointment with [facility] this afternoon.  Has seen floaters and trash in eye OD for the past one to two years.   Referring physician: Shirleen Schirmer [place] [address] [place] [address] [place], [place] [address]  HISTORICAL INFORMATION:   Selected notes from the MEDICAL RECORD NUMBER Referred by Shirleen Schirmer, PA for RD OD LEE: 08/29/2020 Ocular Hx- RD OD BCVA OD CF OS 20/25 PMH- gout, HTN    CURRENT MEDICATIONS: No current outpatient medications on file. (Ophthalmic Drugs)   No current facility-administered medications for this visit. (Ophthalmic Drugs)   Current Outpatient Medications (Other)  Medication Sig   acyclovir (ZOVIRAX) 800 MG tablet TAKE ONE TABLET BY MOUTH DAILY FOR FEVER BLISTERS AND COLD SORES (Patient not taking: Reported on 05/02/2020)   allopurinol (ZYLOPRIM) 300 MG tablet Take 1 tablet Daily to Prevent Gout   alum & mag hydroxide-simeth (MAALOX/MYLANTA) 200-200-20 MG/5ML suspension Take 30 mLs by mouth 2 (two) times daily.   aspirin EC 81 MG tablet Take 81 mg by mouth daily.   atenolol (TENORMIN)  100 MG tablet Take 1 tablet by mouth once daily for blood pressure   B Complex-C (SUPER B COMPLEX PO) Take 1 tablet by mouth daily.   Cholecalciferol (VITAMIN D) 2000 UNITS CAPS Take 4,000 Units by mouth daily.   cyclobenzaprine (FLEXERIL) 10 MG tablet Take 10 mg by mouth at bedtime as needed.   famotidine (PEPCID) 40 MG tablet Take 1 tablet (40 mg total) by mouth every evening.   Flaxseed, Linseed, (FLAX SEED OIL) 1000 MG CAPS Take 1,000 mg by mouth daily.   fluticasone (FLONASE) 50 MCG/ACT nasal spray Place 2 sprays into both nostrils daily.   gabapentin (NEURONTIN) 300 MG capsule Take 1 capsule (300 mg total) by mouth 2 (two) times daily as needed.   hydrochlorothiazide (HYDRODIURIL) 25 MG tablet Take  1 tablet  Daily  for BP & Fluid Retention /Ankle Swelling   loratadine (EQ LORATADINE) 10 MG tablet TAKE 1 TABLET BY MOUTH DAILY FOR ALLERGIES   Omega-3 Fatty Acids (FISH OIL) 1000 MG CAPS Take 1,000 mg by mouth daily.   pravastatin (PRAVACHOL) 20 MG tablet Take     1 tablet       at Bedtime        for Cholesterol   sildenafil (VIAGRA) 100 MG tablet Take 1/2 to 1 tablet /day  ONLY if needed for XXXX   No current facility-administered medications for this visit. (  Other)      REVIEW OF SYSTEMS: ROS   Positive for: Musculoskeletal, Endocrine, Cardiovascular, Eyes Negative for: Constitutional, Gastrointestinal, Neurological, Skin, Genitourinary, HENT, Respiratory, Psychiatric, Allergic/Imm, Heme/Lymph Last edited by Jobe Marker, COT on 08/29/2020  3:56 PM.       ALLERGIES Allergies  Allergen Reactions   Ace Inhibitors     REACTION: cough   Trilipix [Choline Fenofibrate] Other (See Comments)    Heart burn    PAST MEDICAL HISTORY Past Medical History:  Diagnosis Date   GERD (gastroesophageal reflux disease)    Gout    HSV-1 (herpes simplex virus 1) infection    HSV-2 (herpes simplex virus 2) infection    Hyperlipidemia    Hypertension    Hypogonadism male    Obese     Pneumonia    walking -many years ago   Pre-diabetes    Past Surgical History:  Procedure Laterality Date   BACK SURGERY  2012   on back-hernia   HERNIA REPAIR     INSERTION OF MESH N/A 08/15/2013   Procedure: INSERTION OF MESH;  Surgeon: Odis Hollingshead, MD;  Location: [facility];  Service: General;  Laterality: N/A;   LAPAROSCOPIC INGUINAL HERNIA WITH UMBILICAL HERNIA Right 05/14/2246   Procedure: LAPAROSCOPIC RIGH  INGUINAL HERNIA WITH OPEN UMBILICAL HERNIA;  Surgeon: Odis Hollingshead, MD;  Location: [facility];  Service: General;  Laterality: Right;  umbilical repair with mesh    FAMILY HISTORY Family History  Problem Relation Age of Onset   Diabetes Mother    Hypertension Mother    Hypertension Brother    Hypertension Brother    Hypertension Brother    Colon cancer Brother 14   Colon polyps Neg Hx    Esophageal cancer Neg Hx    Rectal cancer Neg Hx    Stomach cancer Neg Hx     SOCIAL HISTORY Social History   Tobacco Use   Smoking status: Former    Pack years: 0.00    Types: Cigarettes    Quit date: 02/23/2002    Years since quitting: 18.5   Smokeless tobacco: Never  Vaping Use   Vaping Use: Never used  Substance Use Topics   Alcohol use: No   Drug use: No         OPHTHALMIC EXAM:  Base Eye Exam     Visual Acuity (Snellen - Linear)       Right Left   Dist [place] CF at 3' 20/25 +1         Tonometry (Tonopen, 3:42 PM)       Right Left   Pressure 15 18         Pupils       Dark Light Shape React APD   Right 5 5 Round Minimal None   Left 3 2 Round Brisk None         Visual Fields (Counting fingers)       Left Right    Full    Restrictions  Total inferior temporal, inferior nasal deficiencies; Partial inner superior temporal, superior nasal deficiencies         Extraocular Movement       Right Left    Full, Ortho Full, Ortho         Neuro/Psych     Oriented x3: Yes   Mood/Affect: Normal         Dilation     Both eyes: 1.0%  Mydriacyl, 2.5% Phenylephrine @ 3:43 PM  Slit Lamp and Fundus Exam     Slit Lamp Exam       Right Left   Lids/Lashes Dermatochalasis - upper lid Dermatochalasis - upper lid   Conjunctiva/Sclera melanosis melanosis   Cornea arcus, 2+ fine PEE arcus, 2+ fine PEE   Anterior Chamber deep and clear deep and clear   Iris round and dilated, no NVI round and dilated, no NVI   Lens 2+ NS, 2+CS 2+ NS, 2+CS   Vitreous syneresis, +pigment syneresis, +pigment         Fundus Exam       Right Left   Disc obscured by RD pink and sharp   C/D Ratio obscured by RD 0.2   Macula superior macula and fovea detached, inferior macula attached flat, good foveal reflex, mild RPE mottling, no heme or edema   Vessels attenuated attenuated   Periphery Bullous detachment from 1030 to 0130, multiple retinal holes and tears at 1100, 1200, and 0115 attached, no heme, no RT/RD on 360 depression            IMAGING AND PROCEDURES  Imaging and Procedures for 08/29/2020  OCT, Retina - OU - Both Eyes       Right Eye Quality was good. Progression has no prior data. Findings include abnormal foveal contour, no IRF, no SRF (Macula not visible due to bullous RD).   Left Eye Quality was good. Central Foveal Thickness: 255. Progression has no prior data. Findings include normal foveal contour, no IRF, no SRF.   Notes *Images captured and stored on drive  Diagnosis / Impression:  OD: Bullous superior RD OS: NFP, no IRF/SRF  Clinical management:  See below  Abbreviations: NFP - Normal foveal profile. CME - cystoid macular edema. PED - pigment epithelial detachment. IRF - intraretinal fluid. SRF - subretinal fluid. EZ - ellipsoid zone. ERM - epiretinal membrane. ORA - outer retinal atrophy. ORT - outer retinal tubulation. SRHM - subretinal hyper-reflective material. IRHM - intraretinal hyper-reflective material      Color Fundus Photography Optos - OU - Both Eyes       Right  Eye Progression has no prior data. Disc findings include (Obscured by RD). Macula : detached. Vessels : attenuated. Periphery : detachment (Bullous superior detachment from 1030 to 0130 extending through macula.).   Left Eye Progression has no prior data. Disc findings include normal observations. Macula : retinal pigment epithelium abnormalities. Vessels : attenuated. Periphery : normal observations.   Notes **Images stored on drive**  Impression: OD: Bullous superior detachment from 1030 to 0130 extending posterior through macula and fovea.               ASSESSMENT/PLAN:    ICD-10-CM   1. Right retinal detachment  H33.21 OCT, Retina - OU - Both Eyes    Color Fundus Photography Optos - OU - Both Eyes    2. Essential hypertension  I10     3. Hypertensive retinopathy of both eyes  H35.033     4. Combined forms of age-related cataract of both eyes  H25.813       1 Rhegmatogenous retinal detachment with multiple holes/tears OD - bullous superior mac off detachment, onset of foveal involvement today, 08/29/20 by history - detached from 1030 to 0130, with multiple retinal holes/tears at 1100,1200, and 0115--extension through fovea/macula - The incidence, risk factors, and natural history of retinal detachment was discussed with patient.  Potential treatment options including delimiting laser, pneumatic retinopexy, scleral buckle, and vitrectomy, cryotherapy and laser,  and the use of air, gas, and oil discussed with patient.  The risks of blindness, loss of vision, infection, hemorrhage, cataract progression or lens displacement were discussed with patient. - recommend SBP + 25g PPV/EL/Gas OD under general anesthesia - pt wishes to proceed with surgery - RBA of procedure discussed, questions answered - informed consent obtained and signed - case scheduled for tomorrow, 7.8.22, 430p, [facility] OR 08 - f/u POD1  2,3. Hypertensive retinopathy OU - discussed importance of tight BP  control - monitor  4. Age related cataracts OU - The symptoms of cataract, surgical options, and treatments and risks were discussed with patient. - discussed diagnosis and progression - monitor   Ophthalmic Meds Ordered this visit:  No orders of the defined types were placed in this encounter.      Return in 2 days (on 08/31/2020) for POV.  There are no Patient Instructions on file for this visit.   Explained the diagnoses, plan, and follow up with the patient and they expressed understanding.  Patient expressed understanding of the importance of proper follow up care.   This document serves as a record of services personally performed by Gardiner Sleeper, MD, PhD. It was created on their behalf by Roselee Nova, COMT. The creation of this record is the provider's dictation and/or activities during the visit.  Electronically signed by: Roselee Nova, COMT 08/29/20 5:39 PM  Gardiner Sleeper, M.D., Ph.D. Diseases & Surgery of the Retina and Vitreous [facility] [facility]  I have reviewed the above documentation for accuracy and completeness, and I agree with the above. Gardiner Sleeper, M.D., Ph.D. 08/29/20 5:39 PM   Abbreviations: M myopia (nearsighted); A astigmatism; H hyperopia (farsighted); P presbyopia; Mrx spectacle prescription;  CTL contact lenses; OD right eye; OS left eye; OU both eyes  XT exotropia; ET esotropia; PEK punctate epithelial keratitis; PEE punctate epithelial erosions; DES dry eye syndrome; MGD meibomian gland dysfunction; ATs artificial tears; PFAT's preservative free artificial tears; [place] nuclear sclerotic cataract; PSC posterior subcapsular cataract; ERM epi-retinal membrane; PVD posterior vitreous detachment; RD retinal detachment; DM diabetes mellitus; DR diabetic retinopathy; NPDR non-proliferative diabetic retinopathy; PDR proliferative diabetic retinopathy; CSME clinically significant macular edema; DME diabetic macular edema; dbh dot blot  hemorrhages; CWS cotton wool spot; POAG primary open angle glaucoma; C/D cup-to-disc ratio; HVF humphrey visual field; GVF goldmann visual field; OCT optical coherence tomography; IOP intraocular pressure; BRVO Branch retinal vein occlusion; CRVO central retinal vein occlusion; CRAO central retinal artery occlusion; BRAO branch retinal artery occlusion; RT retinal tear; SB scleral buckle; PPV pars plana vitrectomy; VH Vitreous hemorrhage; PRP panretinal laser photocoagulation; IVK intravitreal kenalog; VMT vitreomacular traction; MH Macular hole;  NVD neovascularization of the disc; NVE neovascularization elsewhere; AREDS age related eye disease study; ARMD age related macular degeneration; POAG primary open angle glaucoma; EBMD epithelial/anterior basement membrane dystrophy; ACIOL anterior chamber intraocular lens; IOL intraocular lens; PCIOL posterior chamber intraocular lens; Phaco/IOL phacoemulsification with intraocular lens placement; [place] photorefractive keratectomy; LASIK laser assisted in situ keratomileusis; HTN hypertension; DM diabetes mellitus; COPD chronic obstructive pulmonary disease

## 2020-08-29 NOTE — H&P (Signed)
Caleb Johns is an 65 y.o. male.    Chief Complaint: retinal detachment, RIGHT EYE  HPI: 65 yo AAM presents with 1 day history of decreased vision OD. On dilated exam, found to have a fovea- and macula-involving rhegmatogenous retinal detachment of the right eye. After a discussion of the risks, benefits and alternatives to surgery, the patient elects to proceed with surgical repair of his retinal detachment -- SBP + 25g PPV w/ endolaser and gas OD under general anesthesia.  Past Medical History:  Diagnosis Date   GERD (gastroesophageal reflux disease)    Gout    HSV-1 (herpes simplex virus 1) infection    HSV-2 (herpes simplex virus 2) infection    Hyperlipidemia    Hypertension    Hypogonadism male    Obese    Pneumonia    walking -many years ago   Pre-diabetes     Past Surgical History:  Procedure Laterality Date   BACK SURGERY  2012   on back-hernia   HERNIA REPAIR     INSERTION OF MESH N/A 08/15/2013   Procedure: INSERTION OF MESH;  Surgeon: Caleb Hollingshead, MD;  Location: [facility];  Service: General;  Laterality: N/A;   LAPAROSCOPIC INGUINAL HERNIA WITH UMBILICAL HERNIA Right 9/70/2637   Procedure: LAPAROSCOPIC RIGH  INGUINAL HERNIA WITH OPEN UMBILICAL HERNIA;  Surgeon: Caleb Hollingshead, MD;  Location: [facility];  Service: General;  Laterality: Right;  umbilical repair with mesh    Family History  Problem Relation Age of Onset   Diabetes Mother    Hypertension Mother    Hypertension Brother    Hypertension Brother    Hypertension Brother    Colon cancer Brother 38   Colon polyps Neg Hx    Esophageal cancer Neg Hx    Rectal cancer Neg Hx    Stomach cancer Neg Hx    Social History:  reports that he quit smoking about 18 years ago. He has never used smokeless tobacco. He reports that he does not drink alcohol and does not use drugs.  Allergies:  Allergies  Allergen Reactions   Ace Inhibitors     REACTION: cough   Trilipix [Choline Fenofibrate] Other (See Comments)     Heart burn    No medications prior to admission.    Review of systems otherwise negative  There were no vitals taken for this visit.  Physical exam: Mental status: oriented x3. Eyes: See eye exam associated with this date of surgery Ears, Nose, Throat: within normal limits Neck: Within Normal limits General: within normal limits Chest: Within normal limits Breast: deferred Heart: Within normal limits Abdomen: Within normal limits GU: deferred Extremities: within normal limits Skin: within normal limits  Assessment/Plan Macula- and fovea-involving rhegmatogenous retinal detachment, RIGHT EYE  Plan: To [facility] for scleral buckle procedure + 25g PPV w/ endolaser and gas, RIGHT EYE, under general anesthesia - case scheduled for Friday, 7.8.22, 430 pm -- [facility] OR 08  Gardiner Sleeper, M.D., Ph.D. Vitreoretinal Surgeon [facility]

## 2020-08-29 NOTE — Progress Notes (Signed)
Caleb Johns denies chest pain or shortness of breath. Patient denies any s/s of Covid in her home and is unaware of any exposures.   Caleb Johns has Pre- Diabetes, he does not check CBG.

## 2020-08-30 ENCOUNTER — Encounter (HOSPITAL_COMMUNITY)
Admission: RE | Disposition: A | Discharge status: Home / Self Care | Payer: Self-pay | Source: Home / Self Care | Attending: Ophthalmology

## 2020-08-30 ENCOUNTER — Encounter (HOSPITAL_COMMUNITY): Payer: Self-pay | Admitting: Ophthalmology

## 2020-08-30 ENCOUNTER — Encounter (INDEPENDENT_AMBULATORY_CARE_PROVIDER_SITE_OTHER): Payer: Self-pay | Admitting: Ophthalmology

## 2020-08-30 ENCOUNTER — Ambulatory Visit (HOSPITAL_COMMUNITY): Payer: BC Managed Care – PPO | Admitting: Certified Registered"

## 2020-08-30 ENCOUNTER — Ambulatory Visit (HOSPITAL_COMMUNITY)
Admission: RE | Admit: 2020-08-30 | Discharge: 2020-08-30 | Disposition: A | Discharge status: Home / Self Care | Payer: BC Managed Care – PPO | Attending: Ophthalmology | Admitting: Ophthalmology

## 2020-08-30 DIAGNOSIS — E559 Vitamin D deficiency, unspecified: Secondary | ICD-10-CM | POA: Diagnosis not present

## 2020-08-30 DIAGNOSIS — H338 Other retinal detachments: Secondary | ICD-10-CM | POA: Diagnosis not present

## 2020-08-30 DIAGNOSIS — Z888 Allergy status to other drugs, medicaments and biological substances status: Secondary | ICD-10-CM | POA: Diagnosis not present

## 2020-08-30 DIAGNOSIS — H3530 Unspecified macular degeneration: Secondary | ICD-10-CM | POA: Diagnosis not present

## 2020-08-30 DIAGNOSIS — Z87891 Personal history of nicotine dependence: Secondary | ICD-10-CM | POA: Diagnosis not present

## 2020-08-30 DIAGNOSIS — H33021 Retinal detachment with multiple breaks, right eye: Secondary | ICD-10-CM | POA: Insufficient documentation

## 2020-08-30 DIAGNOSIS — H33001 Unspecified retinal detachment with retinal break, right eye: Secondary | ICD-10-CM | POA: Diagnosis not present

## 2020-08-30 DIAGNOSIS — E785 Hyperlipidemia, unspecified: Secondary | ICD-10-CM | POA: Diagnosis not present

## 2020-08-30 HISTORY — PX: GAS/FLUID EXCHANGE: SHX5334

## 2020-08-30 HISTORY — PX: VITRECTOMY 25 GAUGE WITH SCLERAL BUCKLE: SHX6183

## 2020-08-30 HISTORY — PX: GAS INSERTION: SHX5336

## 2020-08-30 HISTORY — PX: PERFLUORONE INJECTION: SHX5302

## 2020-08-30 HISTORY — PX: PHOTOCOAGULATION WITH LASER: SHX6027

## 2020-08-30 LAB — BASIC METABOLIC PANEL
Anion gap: 6 (ref 5–15)
BUN: 11 mg/dL (ref 8–23)
CO2: 25 mmol/L (ref 22–32)
Calcium: 8.7 mg/dL — ABNORMAL LOW (ref 8.9–10.3)
Chloride: 108 mmol/L (ref 98–111)
Creatinine, Ser: 1.02 mg/dL (ref 0.61–1.24)
GFR, Estimated: >60 mL/min (ref >60)
Glucose, Bld: 96 mg/dL (ref 70–99)
Potassium: 3.3 mmol/L — ABNORMAL LOW (ref 3.5–5.1)
Sodium: 139 mmol/L (ref 135–145)

## 2020-08-30 LAB — GLUCOSE, CAPILLARY: Glucose-Capillary: 103 mg/dL — ABNORMAL HIGH (ref 70–99)

## 2020-08-30 SURGERY — VITRECTOMY, USING 25-GAUGE INSTRUMENTS, WITH SCLERAL BUCKLING
Anesthesia: General | Site: Eye | Laterality: Right

## 2020-08-30 MED ORDER — DORZOLAMIDE HCL-TIMOLOL MAL 2-0.5 % OP SOLN
OPHTHALMIC | Status: DC | PRN
  Administered 2020-08-30: 1 [drp] via OPHTHALMIC

## 2020-08-30 MED ORDER — ROCURONIUM BROMIDE 10 MG/ML (PF) SYRINGE
PREFILLED_SYRINGE | INTRAVENOUS | Status: DC | PRN
  Administered 2020-08-30: 30 mg via INTRAVENOUS
  Administered 2020-08-30: 50 mg via INTRAVENOUS
  Administered 2020-08-30: 20 mg via INTRAVENOUS
  Administered 2020-08-30: 10 mg via INTRAVENOUS

## 2020-08-30 MED ORDER — PREDNISOLONE ACETATE 1 % OP SUSP
OPHTHALMIC | Status: DC | PRN
  Administered 2020-08-30: 1 [drp] via OPHTHALMIC

## 2020-08-30 MED ORDER — ACETAZOLAMIDE SODIUM 500 MG IJ SOLR
INTRAMUSCULAR | Status: AC
  Filled 2020-08-30: qty 500

## 2020-08-30 MED ORDER — ROCURONIUM BROMIDE 10 MG/ML (PF) SYRINGE
PREFILLED_SYRINGE | INTRAVENOUS | Status: AC
  Filled 2020-08-30: qty 10

## 2020-08-30 MED ORDER — POLYMYXIN B SULFATE 500000 UNITS IJ SOLR
INTRAMUSCULAR | Status: AC
  Filled 2020-08-30: qty 500000

## 2020-08-30 MED ORDER — PROPARACAINE HCL 0.5 % OP SOLN
1.0000 [drp] | OPHTHALMIC | Status: AC | PRN
  Administered 2020-08-30 (×3): 1 [drp] via OPHTHALMIC
  Filled 2020-08-30: qty 15

## 2020-08-30 MED ORDER — BACITRACIN-POLYMYXIN B 500-10000 UNIT/GM OP OINT
TOPICAL_OINTMENT | OPHTHALMIC | Status: DC | PRN
  Administered 2020-08-30: 1 via OPHTHALMIC

## 2020-08-30 MED ORDER — NA CHONDROIT SULF-NA HYALURON 40-30 MG/ML IO SOSY
INTRAOCULAR | Status: DC | PRN
  Administered 2020-08-30: 0.5 mL via INTRAOCULAR

## 2020-08-30 MED ORDER — CHLORHEXIDINE GLUCONATE 0.12 % MT SOLN
15.0000 mL | Freq: Once | OROMUCOSAL | Status: AC
  Administered 2020-08-30: 15 mL via OROMUCOSAL
  Filled 2020-08-30: qty 15

## 2020-08-30 MED ORDER — SODIUM CHLORIDE 0.9 % IV SOLN: INTRAVENOUS | Status: DC

## 2020-08-30 MED ORDER — PREDNISOLONE ACETATE 1 % OP SUSP
OPHTHALMIC | Status: AC
  Filled 2020-08-30: qty 5

## 2020-08-30 MED ORDER — PROPOFOL 10 MG/ML IV BOLUS
INTRAVENOUS | Status: AC
  Filled 2020-08-30: qty 20

## 2020-08-30 MED ORDER — MIDAZOLAM HCL 2 MG/2ML IJ SOLN
INTRAMUSCULAR | Status: DC | PRN
  Administered 2020-08-30: 2 mg via INTRAVENOUS

## 2020-08-30 MED ORDER — DEXAMETHASONE SODIUM PHOSPHATE 10 MG/ML IJ SOLN
INTRAMUSCULAR | Status: DC | PRN
  Administered 2020-08-30: 5 mg via INTRAVENOUS

## 2020-08-30 MED ORDER — ACETAMINOPHEN 10 MG/ML IV SOLN
INTRAVENOUS | Status: DC | PRN
  Administered 2020-08-30: 1000 mg via INTRAVENOUS

## 2020-08-30 MED ORDER — BRIMONIDINE TARTRATE 0.2 % OP SOLN
OPHTHALMIC | Status: AC
  Filled 2020-08-30: qty 5

## 2020-08-30 MED ORDER — STERILE WATER FOR INJECTION IJ SOLN
INTRAMUSCULAR | Status: DC | PRN
  Administered 2020-08-30: 20 mL

## 2020-08-30 MED ORDER — SODIUM CHLORIDE (PF) 0.9 % IJ SOLN
INTRAMUSCULAR | Status: AC
  Filled 2020-08-30: qty 10

## 2020-08-30 MED ORDER — GATIFLOXACIN 0.5 % OP SOLN
OPHTHALMIC | Status: AC
  Filled 2020-08-30: qty 2.5

## 2020-08-30 MED ORDER — HYDROCODONE-ACETAMINOPHEN 5-325 MG PO TABS: 1.0000 | ORAL_TABLET | ORAL | 0 refills | Status: DC | PRN

## 2020-08-30 MED ORDER — TROPICAMIDE 1 % OP SOLN
1.0000 [drp] | OPHTHALMIC | Status: AC | PRN
  Administered 2020-08-30 (×3): 1 [drp] via OPHTHALMIC
  Filled 2020-08-30: qty 15

## 2020-08-30 MED ORDER — LIDOCAINE HCL (PF) 1 % IJ SOLN
INTRAMUSCULAR | Status: AC
  Filled 2020-08-30: qty 5

## 2020-08-30 MED ORDER — ACETAMINOPHEN 10 MG/ML IV SOLN
INTRAVENOUS | Status: AC
  Filled 2020-08-30: qty 100

## 2020-08-30 MED ORDER — EPINEPHRINE PF 1 MG/ML IJ SOLN
INTRAMUSCULAR | Status: AC
  Filled 2020-08-30: qty 1

## 2020-08-30 MED ORDER — DEXAMETHASONE SODIUM PHOSPHATE 10 MG/ML IJ SOLN
INTRAMUSCULAR | Status: AC
  Filled 2020-08-30: qty 1

## 2020-08-30 MED ORDER — BSS PLUS IO SOLN
INTRAOCULAR | Status: AC
  Filled 2020-08-30: qty 500

## 2020-08-30 MED ORDER — SUGAMMADEX SODIUM 200 MG/2ML IV SOLN
INTRAVENOUS | Status: DC | PRN
  Administered 2020-08-30: 340 mg via INTRAVENOUS

## 2020-08-30 MED ORDER — HYDROMORPHONE HCL 1 MG/ML IJ SOLN: 0.2500 mg | INTRAMUSCULAR | Status: DC | PRN

## 2020-08-30 MED ORDER — ATROPINE SULFATE 1 % OP SOLN
OPHTHALMIC | Status: DC | PRN
  Administered 2020-08-30: 1 [drp] via OPHTHALMIC

## 2020-08-30 MED ORDER — PHENYLEPHRINE HCL 10 % OP SOLN
1.0000 [drp] | OPHTHALMIC | Status: AC | PRN
  Administered 2020-08-30 (×3): 1 [drp] via OPHTHALMIC
  Filled 2020-08-30: qty 5

## 2020-08-30 MED ORDER — BUPIVACAINE HCL (PF) 0.75 % IJ SOLN
INTRAMUSCULAR | Status: AC
  Filled 2020-08-30: qty 10

## 2020-08-30 MED ORDER — ONDANSETRON HCL 4 MG/2ML IJ SOLN
INTRAMUSCULAR | Status: DC | PRN
  Administered 2020-08-30: 4 mg via INTRAVENOUS

## 2020-08-30 MED ORDER — EPHEDRINE SULFATE-NACL 50-0.9 MG/10ML-% IV SOSY
PREFILLED_SYRINGE | INTRAVENOUS | Status: DC | PRN
  Administered 2020-08-30 (×3): 10 mg via INTRAVENOUS

## 2020-08-30 MED ORDER — MIDAZOLAM HCL 2 MG/2ML IJ SOLN
INTRAMUSCULAR | Status: AC
  Filled 2020-08-30: qty 2

## 2020-08-30 MED ORDER — GATIFLOXACIN 0.5 % OP SOLN
OPHTHALMIC | Status: DC | PRN
  Administered 2020-08-30: 1 [drp] via OPHTHALMIC

## 2020-08-30 MED ORDER — PROPOFOL 10 MG/ML IV BOLUS
INTRAVENOUS | Status: DC | PRN
  Administered 2020-08-30: 130 mg via INTRAVENOUS

## 2020-08-30 MED ORDER — BRIMONIDINE TARTRATE 0.2 % OP SOLN
OPHTHALMIC | Status: DC | PRN
  Administered 2020-08-30: 1 [drp] via OPHTHALMIC

## 2020-08-30 MED ORDER — NA CHONDROIT SULF-NA HYALURON 40-30 MG/ML IO SOSY
INTRAOCULAR | Status: AC
  Filled 2020-08-30: qty 1

## 2020-08-30 MED ORDER — STERILE WATER FOR INJECTION IJ SOLN
INTRAMUSCULAR | Status: AC
  Filled 2020-08-30: qty 10

## 2020-08-30 MED ORDER — TRIAMCINOLONE ACETONIDE 40 MG/ML IJ SUSP
INTRAMUSCULAR | Status: DC | PRN
  Administered 2020-08-30: 40 mg

## 2020-08-30 MED ORDER — DORZOLAMIDE HCL-TIMOLOL MAL 2-0.5 % OP SOLN
OPHTHALMIC | Status: AC
  Filled 2020-08-30: qty 10

## 2020-08-30 MED ORDER — CARBACHOL 0.01 % IO SOLN
INTRAOCULAR | Status: AC
  Filled 2020-08-30: qty 1.5

## 2020-08-30 MED ORDER — INDOCYANINE GREEN 25 MG IV SOLR
INTRAVENOUS | Status: AC
  Filled 2020-08-30: qty 10

## 2020-08-30 MED ORDER — ATROPINE SULFATE 1 % OP SOLN
1.0000 [drp] | OPHTHALMIC | Status: AC | PRN
  Administered 2020-08-30 (×3): 1 [drp] via OPHTHALMIC
  Filled 2020-08-30: qty 5

## 2020-08-30 MED ORDER — LIDOCAINE HCL (PF) 2 % IJ SOLN
INTRAMUSCULAR | Status: AC
  Filled 2020-08-30: qty 10

## 2020-08-30 MED ORDER — BSS IO SOLN
INTRAOCULAR | Status: AC
  Filled 2020-08-30: qty 15

## 2020-08-30 MED ORDER — ORAL CARE MOUTH RINSE: 15.0000 mL | Freq: Once | OROMUCOSAL | Status: AC

## 2020-08-30 MED ORDER — TOBRAMYCIN-DEXAMETHASONE 0.3-0.1 % OP OINT
TOPICAL_OINTMENT | OPHTHALMIC | Status: AC
  Filled 2020-08-30: qty 3.5

## 2020-08-30 MED ORDER — BACITRACIN-POLYMYXIN B 500-10000 UNIT/GM OP OINT
TOPICAL_OINTMENT | OPHTHALMIC | Status: AC
  Filled 2020-08-30: qty 3.5

## 2020-08-30 MED ORDER — EPHEDRINE 5 MG/ML INJ
INTRAVENOUS | Status: AC
  Filled 2020-08-30: qty 10

## 2020-08-30 MED ORDER — ATROPINE SULFATE 1 % OP SOLN
OPHTHALMIC | Status: AC
  Filled 2020-08-30: qty 5

## 2020-08-30 MED ORDER — BUPIVACAINE HCL (PF) 0.75 % IJ SOLN
INTRAMUSCULAR | Status: DC | PRN
  Administered 2020-08-30: 5 mL

## 2020-08-30 MED ORDER — ONDANSETRON HCL 4 MG/2ML IJ SOLN
INTRAMUSCULAR | Status: AC
  Filled 2020-08-30: qty 2

## 2020-08-30 MED ORDER — CEFTAZIDIME 1 G IJ SOLR
INTRAMUSCULAR | Status: AC
  Filled 2020-08-30: qty 1

## 2020-08-30 MED ORDER — LIDOCAINE HCL (PF) 2 % IJ SOLN
INTRAMUSCULAR | Status: DC | PRN
  Administered 2020-08-30: 60 mg via INTRADERMAL

## 2020-08-30 MED ORDER — TRIAMCINOLONE ACETONIDE 40 MG/ML IJ SUSP
INTRAMUSCULAR | Status: AC
  Filled 2020-08-30: qty 5

## 2020-08-30 MED ORDER — FENTANYL CITRATE (PF) 250 MCG/5ML IJ SOLN
INTRAMUSCULAR | Status: AC
  Filled 2020-08-30: qty 5

## 2020-08-30 MED ORDER — LIDOCAINE HCL 1 % IJ SOLN
INTRAMUSCULAR | Status: DC | PRN
  Administered 2020-08-30: 5 mL

## 2020-08-30 MED ORDER — FENTANYL CITRATE (PF) 100 MCG/2ML IJ SOLN
INTRAMUSCULAR | Status: DC | PRN
  Administered 2020-08-30 (×3): 50 ug via INTRAVENOUS
  Administered 2020-08-30: 25 ug via INTRAVENOUS

## 2020-08-30 MED ORDER — EPINEPHRINE PF 1 MG/ML IJ SOLN
INTRAOCULAR | Status: DC | PRN
  Administered 2020-08-30: 500.3 mL

## 2020-08-30 SURGICAL SUPPLY — 68 items
APL SWBSTK 6 STRL LF DISP (MISCELLANEOUS) ×8
APPLICATOR COTTON TIP 6 STRL (MISCELLANEOUS) ×8 IMPLANT
APPLICATOR COTTON TIP 6IN STRL (MISCELLANEOUS) ×12 IMPLANT
BAND SCLERAL BUCKLING TYPE 41 (Ophthalmic Related) ×2 IMPLANT
BAND WRIST GAS GREEN (MISCELLANEOUS) ×1 IMPLANT
BETADINE 5% OPHTHALMIC (OPHTHALMIC) ×4 IMPLANT
BNDG EYE OVAL (GAUZE/BANDAGES/DRESSINGS) ×2 IMPLANT
CABLE BIPOLOR RESECTION CORD (MISCELLANEOUS) ×3 IMPLANT
CANNULA DUALBORE 25G (CANNULA) ×2 IMPLANT
CANNULA FLEX TIP 25G (CANNULA) ×1 IMPLANT
CANNULA VLV SOFT TIP 25G (OPHTHALMIC) IMPLANT
CANNULA VLV SOFT TIP 25GA (OPHTHALMIC) IMPLANT
COVER SURGICAL LIGHT HANDLE (MISCELLANEOUS) ×3 IMPLANT
DRAPE MICROSCOPE LEICA (MISCELLANEOUS) ×2 IMPLANT
DRAPE OPHTHALMIC 77X100 STRL (CUSTOM PROCEDURE TRAY) ×3 IMPLANT
ERASER HMR WETFIELD 23G BP (MISCELLANEOUS) IMPLANT
FILTER BLUE MILLIPORE (MISCELLANEOUS) IMPLANT
FORCEPS GRIESHABER ILM 25G A (INSTRUMENTS) IMPLANT
GAS AUTO FILL CONSTEL (OPHTHALMIC) ×3
GAS AUTO FILL CONSTELLATION (OPHTHALMIC) ×1 IMPLANT
GAS WRIST BAND GREEN (MISCELLANEOUS) ×3
GAUZE SPONGE 4X4 12PLY STRL (GAUZE/BANDAGES/DRESSINGS) ×2 IMPLANT
GLOVE SURG ENC MOIS LTX SZ7.5 (GLOVE) ×6 IMPLANT
GLOVE SURG ENC TEXT LTX SZ7 (GLOVE) ×3 IMPLANT
GOWN STRL REUS W/ TWL LRG LVL3 (GOWN DISPOSABLE) ×4 IMPLANT
GOWN STRL REUS W/ TWL XL LVL3 (GOWN DISPOSABLE) ×2 IMPLANT
GOWN STRL REUS W/TWL LRG LVL3 (GOWN DISPOSABLE) ×6
GOWN STRL REUS W/TWL XL LVL3 (GOWN DISPOSABLE) ×3
KIT BASIN OR (CUSTOM PROCEDURE TRAY) ×3 IMPLANT
KIT PERFLUORON PROCEDURE 5ML (MISCELLANEOUS) ×2 IMPLANT
LENS VITRECTOMY FLAT OCLR DISP (MISCELLANEOUS) IMPLANT
MICROPICK 25G (MISCELLANEOUS)
NDL 18GX1X1/2 (RX/OR ONLY) (NEEDLE) ×1 IMPLANT
NDL 25GX 5/8IN NON SAFETY (NEEDLE) ×2 IMPLANT
NDL HYPO 30X.5 LL (NEEDLE) ×2 IMPLANT
NDL PRECISIONGLIDE 27X1.5 (NEEDLE) IMPLANT
NEEDLE 18GX1X1/2 (RX/OR ONLY) (NEEDLE) ×6 IMPLANT
NEEDLE 25GX 5/8IN NON SAFETY (NEEDLE) ×6 IMPLANT
NEEDLE HYPO 30X.5 LL (NEEDLE) ×6 IMPLANT
NEEDLE PRECISIONGLIDE 27X1.5 (NEEDLE) IMPLANT
OPHTHALMIC BETADINE 5% (OPHTHALMIC) ×6
PACK VITRECTOMY CUSTOM (CUSTOM PROCEDURE TRAY) ×3 IMPLANT
PAD ARMBOARD 7.5X6 YLW CONV (MISCELLANEOUS) ×6 IMPLANT
PAK PIK VITRECTOMY CVS 25GA (OPHTHALMIC) IMPLANT
PIC ILLUMINATED 25G (OPHTHALMIC)
PICK MICROPICK 25G (MISCELLANEOUS) IMPLANT
PIK ILLUMINATED 25G (OPHTHALMIC) IMPLANT
PROBE ENDO DIATHERMY 25G (MISCELLANEOUS) ×2 IMPLANT
PROBE LASER ILLUM FLEX CVD 25G (OPHTHALMIC) IMPLANT
REPL STRA BRUSH NDL (NEEDLE) IMPLANT
REPL STRA BRUSH NEEDLE (NEEDLE) IMPLANT
RESERVOIR BACK FLUSH (MISCELLANEOUS) IMPLANT
SHIELD EYE LENSE ONLY DISP (GAUZE/BANDAGES/DRESSINGS) ×2 IMPLANT
SLEEVE SCLERAL BUCK TYPE 70 (Ophthalmic Related) ×2 IMPLANT
STOPCOCK 4 WAY LG BORE MALE ST (IV SETS) IMPLANT
STRIP CLOSURE SKIN 1/2X4 (GAUZE/BANDAGES/DRESSINGS) ×3 IMPLANT
SUT ETHILON 5.0 S-24 (SUTURE) ×3 IMPLANT
SUT SILK 2 0 (SUTURE) ×3
SUT SILK 2-0 18XBRD TIE 12 (SUTURE) ×2 IMPLANT
SUT VICRYL 7 0 TG140 8 (SUTURE) ×3 IMPLANT
SYR 10ML LL (SYRINGE) ×5 IMPLANT
SYR 20ML LL LF (SYRINGE) ×3 IMPLANT
SYR 5ML LL (SYRINGE) ×1 IMPLANT
SYR TB 1ML LUER SLIP (SYRINGE) ×6 IMPLANT
TOWEL GREEN STERILE FF (TOWEL DISPOSABLE) ×3 IMPLANT
TRAY FOLEY W/BAG SLVR 14FR (SET/KITS/TRAYS/PACK) ×1 IMPLANT
TUBING HIGH PRESS EXTEN 6IN (TUBING) IMPLANT
WATER STERILE IRR 1000ML POUR (IV SOLUTION) ×3 IMPLANT

## 2020-08-30 NOTE — Brief Op Note (Signed)
08/30/2020  7:59 PM  PATIENT:  Caleb Johns  65 y.o. male  PRE-OPERATIVE DIAGNOSIS:  right retinal detachment  POST-OPERATIVE DIAGNOSIS:  right retinal detachment  PROCEDURE:  Procedure(s): SCLERAL BUCKLE WITH 25 GAUGE PARS PLANA VITRECTOMY (Right) PHOTOCOAGULATION WITH LASER (Right) INSERTION OF GAS - C3F8 (Right) GAS/FLUID EXCHANGE (Right) PERFLUORONE INJECTION (Right)  SURGEON:  Surgeon(s) and Role:    Bernarda Caffey, MD - Primary  ASSISTANTS: Ernest Mallick, Ophthalmic Assistant    ANESTHESIA:   local and general  EBL:  1cc  BLOOD ADMINISTERED:none  DRAINS: none   LOCAL MEDICATIONS USED:  BUPIVICAINE , LIDOCAINE , and Amount: 10 ml  SPECIMEN:  No Specimen  DISPOSITION OF SPECIMEN:  N/A  COUNTS:  YES  TOURNIQUET:  * No tourniquets in log *  DICTATION: .Note written in EPIC  PLAN OF CARE: Discharge to home after PACU  PATIENT DISPOSITION:  PACU - hemodynamically stable.   Delay start of Pharmacological VTE agent (>24hrs) due to surgical blood loss or risk of bleeding: not applicable

## 2020-08-30 NOTE — Anesthesia Procedure Notes (Signed)
Procedure Name: Intubation Date/Time: 08/30/2020 4:28 PM Performed by: Barrington Ellison, CRNA Pre-anesthesia Checklist: Patient identified, Emergency Drugs available, Suction available and Patient being monitored Patient Re-evaluated:Patient Re-evaluated prior to induction Oxygen Delivery Method: Circle System Utilized Preoxygenation: Pre-oxygenation with 100% oxygen Induction Type: IV induction Ventilation: Mask ventilation without difficulty Laryngoscope Size: Mac and 4 Grade View: Grade I Tube type: Oral Tube size: 7.5 mm Number of attempts: 1 Airway Equipment and Method: Stylet and Oral airway Placement Confirmation: ETT inserted through vocal cords under direct vision, positive ETCO2 and breath sounds checked- equal and bilateral Secured at: 22 cm Tube secured with: Tape Dental Injury: Teeth and Oropharynx as per pre-operative assessment

## 2020-08-30 NOTE — Transfer of Care (Signed)
Immediate Anesthesia Transfer of Care Note  Patient: Caleb Johns  Procedure(s) Performed: SCLERAL BUCKLE WITH 25 GAUGE PARS PLANA VITRECTOMY (Right: Eye) PHOTOCOAGULATION WITH LASER (Right: Eye) INSERTION OF GAS - C3F8 (Right: Eye) GAS/FLUID EXCHANGE (Right: Eye) PERFLUORONE INJECTION (Right: Eye)  Patient Location: PACU  Anesthesia Type:General  Level of Consciousness: awake, alert  and patient cooperative  Airway & Oxygen Therapy: Patient Spontanous Breathing  Post-op Assessment: Report given to RN, Post -op Vital signs reviewed and stable and Patient moving all extremities X 4  Post vital signs: Reviewed and stable  Last Vitals:  Vitals Value Taken Time  BP 140/81   Temp    Pulse 62 08/30/20 2000  Resp 22 08/30/20 2000  SpO2 96 % 08/30/20 2000  Vitals shown include unvalidated device data.  Last Pain:  Vitals:   08/30/20 1429  TempSrc:   PainSc: 0-No pain         Complications: No notable events documented.

## 2020-08-30 NOTE — Discharge Instructions (Signed)
POSTOPERATIVE INSTRUCTIONS  Your doctor has performed vitreoretinal surgery on you at [facility]. [place] eye patched and shielded until seen by Dr. Coralyn Pear 1030 AM tomorrow in clinic - Do not use drops until return - [place] - Sleep with belly down or on left side, avoid laying flat on back.    - No strenuous bending, stooping or lifting.  - You may not drive until further notice.  - If your doctor used a gas bubble in your eye during the procedure he will advise you on postoperative positioning. If you have a gas bubble you will be wearing a green bracelet that was applied in the operating room. The green bracelet should stay on as long as the gas bubble is in your eye. While the gas bubble is present you should not fly in an airplane. If you require general anesthesia while the gas bubble is present you must notify your anesthesiologist that an intraocular gas bubble is present so he can take the appropriate precautions.  - Tylenol or any other over-the-counter pain reliever can be used according to your doctor. If more pain medicine is required, your doctor will have a prescription for you.  - You may read, go up and down stairs, and watch television.     Bernarda Caffey, M.D., Ph.D.

## 2020-08-30 NOTE — Op Note (Signed)
Date of procedure: 07.08.22   Surgeon: Bernarda Caffey, MD, PhD   Assistant: Ernest Mallick, Ophthalmic Assistant    Pre-operative Diagnosis: Macula involving Rhegmatogenous Retinal Detachment, Right Eye   Post-operative diagnosis: Macula involving Rhegmatogenous Retinal Detachment, Right Eye   Anesthesia: GETA   Procedures: 1)     Scleral Buckle, Right Eye 2)     25 gauge pars plana vitrectomy, Right Eye CPT [phone] 3)     Perfluorocarbon injection 4)     Fluid-air exchange, Right Eye 5)     Endolaser, Right Eye 5)     Injection of 86% P6P9 gas   Complications: none Estimated blood loss: minimal Specimens: none   Brief history:   The patient has a history of decreased vision in the affected right eye, and on examination, was noted to have a macula-involving retinal detachment, affecting activities of daily living.  The risks, benefits, and alternatives were explained to the patient, including pain, bleeding, infection, loss of vision, double vision, droopy eyelids, and need for more surgeries.  Informed consent was obtained from the patient and placed in the chart.     Procedure:             The patient was brought to the preoperative holding area where the correct eye was confirmed and marked.  The patient was then brought to the operating room where general endotracheal anesthesia was induced. A secondary time-out was performed to identify the correct patient, eye, procedures, and any allergies. The right eye was prepped and draped in the usual sterile ophthalmic fashion followed by placement of a lid speculum.             A 360 conjunctival peritomy was created using Westcott scissors and 0.12 forceps. Each of the four quadrants between the rectus muscles was dissected using Stevens scissors to detach Tenon's attachments from the globe. Each of the four rectus muscles was isolated on a muscle hook and slung using 2-0 Silk suture in the usual standard fashion. Each of the four quadrants  between the rectus muscles was inspected and there were noted to be no areas of scleral thinning. A #50 silicone band was then brought onto the field and was threaded under each rectus muscle. The band was then loosely secured using a #70 Watzke sleeve in the superotemporal quadrant. The band was then sutured to the sclera in each quadrant using 5-0 nylon sutures passed partial thickness through the sclera in a horizontal mattress fashion. The scleral buckle was then tightened to the appropriate height with two locking needle drivers. Attention was then turned to the vitrectomy portion of the procedure.              A 25 gauge trocar was placed in the inferotemporal quadrant in a beveled fashion. A 4 mm infusion cannula was placed through this trocar, and the infusion cannula was confirmed in the vitreous cavity with no incarceration of retina or choroid prior to turning it on. Two additional 25 gauge trocars were placed in the superonasal and superotemporal quadrants (2 and 10 oclock, respectively) in a similar beveled fashion. At this time, a standard three-port pars plana vitrectomy was performed using the light pipe, the cutter, and the BIOM viewing system. A thorough anterior, core and peripheral vitreous dissection was performed. A posterior vitreous detachment was confirmed over the optic nerve. There was a bullous superior retinal detachment extending from 1030 to 0130 w/ macular involvement. The fovea was detached. There were multiple holes within the detached  retina at 1100, 1200, 0100 and 0130 oclock.             Traction was removed from all retinal breaks. The breaks were trimmed using the cutter to smooth the edges. Perfluoron was injected to push the subretinal fluid anterior to the scleral buckle.  A complete fluid-air exchange was performed with a soft tip extrusion cannula over the superior breaks, then posteriorly to remove the perfluoron. After completion of these maneuvers, the retina was flat  over the macula and over the scleral buckle. Under air, endolaser was applied to all the breaks and over and posterior to the scleral buckle.             At this time, the buckle height was confirmed and the buckle was finalized by trimming the band ends. The superotemporal trocar was removed and sutured with 7-0 vicryl in an interrupted fashion.  A complete air to 14% C3F8 gas exchange was performed through the infusion cannula and vented through the superonasal trocar using the extrusion cannula. The superonasal trocar and infusion cannula and associated trocar were then removed and sutured with 7-0 vicryl in an interrupted fashion. Kefzol + polymixin irrigation was then used over the buckle. A subtenon's block containing 0.75% marcaine and 2% lidocaine was administered.              The conjunctiva was closed with 7-0 vicryl sutures. The eye's intraocular pressure was confirmed to be at a physiologic level by digital palpation. Subconjunctival injections of Antibiotic and kenalog were administered. The lid speculum and drapes were removed. Drops of an antibiotic, antihypertensives, and steroid were given. Copious antibiotic ointment was instilled into the eye. The eye was patched and shielded. The patient tolerated the procedure well without any intraoperative or immediate postoperative complications. The patient was taken to the recovery room in good condition. The patient was instructed to maintain a strict face-down position and will be seen by Dr. Coralyn Pear tomorrow morning in clinic.

## 2020-08-30 NOTE — Interval H&P Note (Signed)
History and Physical Interval Note:  08/30/2020 3:49 PM  Caleb Johns  has presented today for surgery, with the diagnosis of right retinal detachment.  The various methods of treatment have been discussed with the patient and family. After consideration of risks, benefits and other options for treatment, the patient has consented to  Procedure(s): SCLERAL BUCKLE  25 GAUGE PARS PLANA VITRECTOMY WITH ENDOLASER AND GAS (Right) as a surgical intervention.  The patient's history has been reviewed, patient examined, no change in status, stable for surgery.  I have reviewed the patient's chart and labs.  Questions were answered to the patient's satisfaction.     Bernarda Caffey

## 2020-08-30 NOTE — Anesthesia Postprocedure Evaluation (Signed)
Anesthesia Post Note  Patient: Caleb Johns  Procedure(s) Performed: SCLERAL BUCKLE WITH 25 GAUGE PARS PLANA VITRECTOMY (Right: Eye) PHOTOCOAGULATION WITH LASER (Right: Eye) INSERTION OF GAS - C3F8 (Right: Eye) GAS/FLUID EXCHANGE (Right: Eye) PERFLUORONE INJECTION (Right: Eye)     Patient location during evaluation: PACU Anesthesia Type: General Level of consciousness: awake and alert Pain management: pain level controlled Vital Signs Assessment: post-procedure vital signs reviewed and stable Respiratory status: spontaneous breathing, nonlabored ventilation, respiratory function stable and patient connected to nasal cannula oxygen Cardiovascular status: blood pressure returned to baseline and stable Postop Assessment: no apparent nausea or vomiting Anesthetic complications: no   No notable events documented.  Last Vitals:  Vitals:   08/30/20 2030 08/30/20 2043  BP: 135/85 (!) 142/86  Pulse: (!) 55 (!) 58  Resp: (!) 25   Temp:  36.9 C  SpO2: 95%     Last Pain:  Vitals:   08/30/20 2030  TempSrc:   PainSc: 0-No pain                 Laurence Folz S

## 2020-08-30 NOTE — Anesthesia Preprocedure Evaluation (Addendum)
Anesthesia Evaluation  Patient identified by MRN, date of birth, ID band Patient awake    Reviewed: Allergy & Precautions, H&P , NPO status , Patient's Chart, lab work & pertinent test results, reviewed documented beta blocker date and time   Airway Mallampati: II  TM Distance: >3 FB Neck ROM: Full    Dental no notable dental hx. (+) Teeth Intact, Dental Advisory Given   Pulmonary neg pulmonary ROS, former smoker,    Pulmonary exam normal breath sounds clear to auscultation       Cardiovascular hypertension, Pt. on medications and Pt. on home beta blockers  Rhythm:Regular Rate:Normal     Neuro/Psych negative neurological ROS  negative psych ROS   GI/Hepatic Neg liver ROS, GERD  Medicated,  Endo/Other  negative endocrine ROS  Renal/GU negative Renal ROS  negative genitourinary   Musculoskeletal   Abdominal   Peds  Hematology negative hematology ROS (+)   Anesthesia Other Findings   Reproductive/Obstetrics negative OB ROS                            Anesthesia Physical Anesthesia Plan  ASA: 2  Anesthesia Plan: General   Post-op Pain Management:    Induction: Intravenous  PONV Risk Score and Plan: 3 and Ondansetron, Dexamethasone and Midazolam  Airway Management Planned: Oral ETT  Additional Equipment:   Intra-op Plan:   Post-operative Plan: Extubation in OR  Informed Consent: I have reviewed the patients History and Physical, chart, labs and discussed the procedure including the risks, benefits and alternatives for the proposed anesthesia with the patient or authorized representative who has indicated his/her understanding and acceptance.     Dental advisory given  Plan Discussed with: CRNA  Anesthesia Plan Comments:         Anesthesia Quick Evaluation

## 2020-08-31 ENCOUNTER — Ambulatory Visit (INDEPENDENT_AMBULATORY_CARE_PROVIDER_SITE_OTHER): Payer: BC Managed Care – PPO | Admitting: Ophthalmology

## 2020-08-31 ENCOUNTER — Encounter (INDEPENDENT_AMBULATORY_CARE_PROVIDER_SITE_OTHER): Payer: Self-pay | Admitting: Ophthalmology

## 2020-08-31 DIAGNOSIS — H25813 Combined forms of age-related cataract, bilateral: Secondary | ICD-10-CM

## 2020-08-31 DIAGNOSIS — H35033 Hypertensive retinopathy, bilateral: Secondary | ICD-10-CM

## 2020-08-31 DIAGNOSIS — H3321 Serous retinal detachment, right eye: Secondary | ICD-10-CM

## 2020-08-31 DIAGNOSIS — I10 Essential (primary) hypertension: Secondary | ICD-10-CM

## 2020-08-31 NOTE — Progress Notes (Signed)
[facility] Clinic Note  08/31/2020     CHIEF COMPLAINT Patient presents for Post-op Follow-up   HISTORY OF PRESENT ILLNESS: Caleb Johns is a 65 y.o. male who presents to the clinic today for:   HPI     Post-op Follow-up   In right eye.  I, the attending physician,  performed the HPI with the patient and updated documentation appropriately.        Comments   Did well overnight. Reports +eye pain and FBS. Has not picked up [place] yet. Slept on belly and on left side with face down.      Last edited by Bernarda Caffey, MD on 08/31/2020 10:57 AM.     Patient states woke up this morning and "vision seemed weak" OD. Went to work and had difficulty seeing peripherally and decided to make an appointment with [facility] this afternoon.  Has seen floaters and trash in eye OD for the past one to two years.   Referring physician: Shirleen Schirmer [place] [address] [place], [place] [address]  HISTORICAL INFORMATION:   Selected notes from the MEDICAL RECORD NUMBER Referred by Shirleen Schirmer, PA for RD OD LEE: 08/29/2020 Ocular Hx- RD OD BCVA OD CF OS 20/25 PMH- gout, HTN    CURRENT MEDICATIONS: No current outpatient medications on file. (Ophthalmic Drugs)   No current facility-administered medications for this visit. (Ophthalmic Drugs)   Current Outpatient Medications (Other)  Medication Sig   acyclovir (ZOVIRAX) 800 MG tablet TAKE ONE TABLET BY MOUTH DAILY FOR FEVER BLISTERS AND COLD SORES (Patient not taking: Reported on 05/02/2020)   allopurinol (ZYLOPRIM) 300 MG tablet Take 1 tablet Daily to Prevent Gout   alum & mag hydroxide-simeth (MAALOX/MYLANTA) 200-200-20 MG/5ML suspension Take 30 mLs by mouth 2 (two) times daily.   aspirin EC 81 MG tablet Take 81 mg by mouth daily.   atenolol (TENORMIN) 100 MG tablet Take 1 tablet by mouth once daily for blood pressure   B Complex-C (SUPER B COMPLEX PO) Take 1 tablet by mouth daily.   Cholecalciferol  (VITAMIN D) 2000 UNITS CAPS Take 4,000 Units by mouth daily.   cyclobenzaprine (FLEXERIL) 10 MG tablet Take 10 mg by mouth at bedtime as needed.   famotidine (PEPCID) 40 MG tablet Take 1 tablet (40 mg total) by mouth every evening.   Flaxseed, Linseed, (FLAX SEED OIL) 1000 MG CAPS Take 1,000 mg by mouth daily.   fluticasone (FLONASE) 50 MCG/ACT nasal spray Place 2 sprays into both nostrils daily.   gabapentin (NEURONTIN) 300 MG capsule Take 1 capsule (300 mg total) by mouth 2 (two) times daily as needed.   hydrochlorothiazide (HYDRODIURIL) 25 MG tablet Take  1 tablet  Daily  for BP & Fluid Retention /Ankle Swelling   HYDROcodone-acetaminophen (NORCO/VICODIN) 5-325 MG tablet Take 1 tablet by mouth every 4 (four) hours as needed for moderate pain.   loratadine (EQ LORATADINE) 10 MG tablet TAKE 1 TABLET BY MOUTH DAILY FOR ALLERGIES   Omega-3 Fatty Acids (FISH OIL) 1000 MG CAPS Take 1,000 mg by mouth daily.   pravastatin (PRAVACHOL) 20 MG tablet Take     1 tablet       at Bedtime        for Cholesterol   sildenafil (VIAGRA) 100 MG tablet Take 1/2 to 1 tablet /day  ONLY if needed for XXXX   No current facility-administered medications for this visit. (Other)      REVIEW OF SYSTEMS:  ROS   Positive for: Eyes Negative for: Constitutional, Gastrointestinal, Neurological, Skin, Genitourinary, Musculoskeletal, HENT, Endocrine, Cardiovascular, Respiratory, Psychiatric, Allergic/Imm, Heme/Lymph Last edited by Bernarda Caffey, MD on 08/31/2020 10:57 AM.        ALLERGIES Allergies  Allergen Reactions   Ace Inhibitors     REACTION: cough   Trilipix [Choline Fenofibrate] Other (See Comments)    Heart burn    PAST MEDICAL HISTORY Past Medical History:  Diagnosis Date   GERD (gastroesophageal reflux disease)    Gout    HSV-1 (herpes simplex virus 1) infection    HSV-2 (herpes simplex virus 2) infection    Hyperlipidemia    Hypertension    Hypogonadism male    Obese    Pneumonia    walking  -many years ago   Pre-diabetes    Past Surgical History:  Procedure Laterality Date   BACK SURGERY  2012   on back-hernia   HERNIA REPAIR     INSERTION OF MESH N/A 08/15/2013   Procedure: INSERTION OF MESH;  Surgeon: Odis Hollingshead, MD;  Location: [facility];  Service: General;  Laterality: N/A;   LAPAROSCOPIC INGUINAL HERNIA WITH UMBILICAL HERNIA Right 5/36/4680   Procedure: LAPAROSCOPIC RIGH  INGUINAL HERNIA WITH OPEN UMBILICAL HERNIA;  Surgeon: Odis Hollingshead, MD;  Location: [facility];  Service: General;  Laterality: Right;  umbilical repair with mesh    FAMILY HISTORY Family History  Problem Relation Age of Onset   Diabetes Mother    Hypertension Mother    Hypertension Brother    Hypertension Brother    Hypertension Brother    Colon cancer Brother 32   Colon polyps Neg Hx    Esophageal cancer Neg Hx    Rectal cancer Neg Hx    Stomach cancer Neg Hx     SOCIAL HISTORY Social History   Tobacco Use   Smoking status: Former    Pack years: 0.00    Types: Cigarettes    Quit date: 02/23/2002    Years since quitting: 18.5   Smokeless tobacco: Never  Vaping Use   Vaping Use: Never used  Substance Use Topics   Alcohol use: No   Drug use: No         OPHTHALMIC EXAM:  Base Eye Exam     Visual Acuity (Snellen - Linear)       Right Left   Dist [place] HM          Tonometry (Tonopen, 10:38 AM)       Right Left   Pressure 14 7         Pupils       Dark Light Shape React   Right 4 4 Round -   Left             Extraocular Movement       Right Left    Full, Ortho Full, Ortho         Neuro/Psych     Oriented x3: Yes   Mood/Affect: Normal           Slit Lamp and Fundus Exam     Slit Lamp Exam       Right Left   Lids/Lashes Dermatochalasis - upper lid Dermatochalasis - upper lid   Conjunctiva/Sclera melanosis; subconj melanosis   Cornea arcus, central epi defect arcus, 2+ fine PEE   Anterior Chamber deep and clear; narrow angles deep and  clear   Iris round and dilated, no NVI round and dilated, no NVI  Lens 2+ NS, 2+CS; 2+ PC feathering 2+ NS, 2+CS   Vitreous post vitrectomy; good gas fill syneresis, +pigment         Fundus Exam       Right Left   Disc very hazy view; perfused pink and sharp   C/D Ratio  0.2   Macula very hazy view; grossly flat flat, good foveal reflex, mild RPE mottling, no heme or edema   Vessels attenuated attenuated   Periphery hazy view; retina attached over buckle; good buckle height; good laser over buckle; ORIGINALLY: Bullous detachment from 1030 to 0130, multiple retinal holes and tears at 1100, 1200, and 0115 attached, no heme, no RT/RD on 360 depression            IMAGING AND PROCEDURES  Imaging and Procedures for 08/31/2020            ASSESSMENT/PLAN:    ICD-10-CM   1. Right retinal detachment  H33.21     2. Essential hypertension  I10     3. Hypertensive retinopathy of both eyes  H35.033     4. Combined forms of age-related cataract of both eyes  H25.813        1 Rhegmatogenous retinal detachment with multiple holes/tears OD - bullous superior mac off detachment, onset of foveal involvement, 08/29/20 by history - detached from 1030 to 0130, with multiple retinal holes/tears at 1100,1200, and 0115--posterior extension of SRF through fovea/macula - now POD1 s/p #41 SBP + 25g PPV/PFO/EL/C3F8 OD, 7.8.22 - doing well this morning - retina attached and in good position -- good buckle height and laser around breaks              - IOP 14              - start    PF 6x/day OD                           zymaxid QID OD                           Atropine BID OD                           PSO ung QID OD               - cont face down positioning x3 days; avoid laying flat on back               - eye shield when sleeping               - post op drop and positioning instructions reviewed               - tylenol/ibuprofen for pain               - Rx given for breakthrough  pain - f/u POW1 -- Friday 815 am  2,3. Hypertensive retinopathy OU - discussed importance of tight BP control - monitor  4. Age related cataracts OU - The symptoms of cataract, surgical options, and treatments and risks were discussed with patient. - discussed diagnosis and progression - monitor   Ophthalmic Meds Ordered this visit:  No orders of the defined types were placed in this encounter.     Return in about 6 days (around 09/06/2020) for POV s/p buckle vit OD.  There are no Patient Instructions on  file for this visit.   Explained the diagnoses, plan, and follow up with the patient and they expressed understanding.  Patient expressed understanding of the importance of proper follow up care.    Gardiner Sleeper, M.D., Ph.D. Diseases & Surgery of the Retina and Vitreous [facility]    Abbreviations: M myopia (nearsighted); A astigmatism; H hyperopia (farsighted); P presbyopia; Mrx spectacle prescription;  CTL contact lenses; OD right eye; OS left eye; OU both eyes  XT exotropia; ET esotropia; PEK punctate epithelial keratitis; PEE punctate epithelial erosions; DES dry eye syndrome; MGD meibomian gland dysfunction; ATs artificial tears; PFAT's preservative free artificial tears; [place] nuclear sclerotic cataract; PSC posterior subcapsular cataract; ERM epi-retinal membrane; PVD posterior vitreous detachment; RD retinal detachment; DM diabetes mellitus; DR diabetic retinopathy; NPDR non-proliferative diabetic retinopathy; PDR proliferative diabetic retinopathy; CSME clinically significant macular edema; DME diabetic macular edema; dbh dot blot hemorrhages; CWS cotton wool spot; POAG primary open angle glaucoma; C/D cup-to-disc ratio; HVF humphrey visual field; GVF goldmann visual field; OCT optical coherence tomography; IOP intraocular pressure; BRVO Branch retinal vein occlusion; CRVO central retinal vein occlusion; CRAO central retinal artery occlusion; BRAO  branch retinal artery occlusion; RT retinal tear; SB scleral buckle; PPV pars plana vitrectomy; VH Vitreous hemorrhage; PRP panretinal laser photocoagulation; IVK intravitreal kenalog; VMT vitreomacular traction; MH Macular hole;  NVD neovascularization of the disc; NVE neovascularization elsewhere; AREDS age related eye disease study; ARMD age related macular degeneration; POAG primary open angle glaucoma; EBMD epithelial/anterior basement membrane dystrophy; ACIOL anterior chamber intraocular lens; IOL intraocular lens; PCIOL posterior chamber intraocular lens; Phaco/IOL phacoemulsification with intraocular lens placement; [place] photorefractive keratectomy; LASIK laser assisted in situ keratomileusis; HTN hypertension; DM diabetes mellitus; COPD chronic obstructive pulmonary disease

## 2020-09-02 NOTE — Progress Notes (Signed)
[place] Clinic Note  09/06/2020     CHIEF COMPLAINT Patient presents for Post-op Follow-up   HISTORY OF PRESENT ILLNESS: Caleb Johns is a 65 y.o. male who presents to the clinic today for:   HPI     Post-op Follow-up   In right eye.  Discomfort includes none.  I, the attending physician,  performed the HPI with the patient and updated documentation appropriately.        Comments   6 day post op OD- Doing well, he is now seeing green instead of orange in his vision.  He thinks it is getting better.   Prednisolone 6x/d, Atropine BID, PSO ung QID, Zymaxid QID OD      Last edited by Bernarda Caffey, MD on 09/06/2020  8:25 AM.      Patient states this past week went well, his daughters are taking care of him, he is maintaining face down position as directed, he states his vision looks "green" now and he feels like it's getting better, daughter reports he is using drops as directed  Referring physician: Shirleen Schirmer PA [address] [place], [place] [address]  HISTORICAL INFORMATION:   Selected notes from the [place] Referred by Shirleen Schirmer, PA for RD OD LEE: 08/29/2020 Ocular Hx- RD OD BCVA OD CF OS 20/25 PMH- gout, HTN    CURRENT MEDICATIONS: Current Outpatient Medications (Ophthalmic Drugs)  Medication Sig   bacitracin-polymyxin b (POLYSPORIN) ophthalmic ointment Place into the right eye at bedtime. Place a 1/2 inch ribbon of ointment into the lower eyelid.   prednisoLONE acetate (PRED FORTE) 1 % ophthalmic suspension Place 1 drop into the right eye 4 (four) times daily.   No current facility-administered medications for this visit. (Ophthalmic Drugs)   Current Outpatient Medications (Other)  Medication Sig   allopurinol (ZYLOPRIM) 300 MG tablet Take 1 tablet Daily to Prevent Gout   alum & mag hydroxide-simeth (MAALOX/MYLANTA) 200-200-20 MG/5ML suspension Take 30 mLs by mouth 2 (two) times daily.   aspirin EC 81  MG tablet Take 81 mg by mouth daily.   atenolol (TENORMIN) 100 MG tablet Take 1 tablet by mouth once daily for blood pressure   B Complex-C (SUPER B COMPLEX PO) Take 1 tablet by mouth daily.   Cholecalciferol (VITAMIN D) 2000 UNITS CAPS Take 4,000 Units by mouth daily.   cyclobenzaprine (FLEXERIL) 10 MG tablet Take 10 mg by mouth at bedtime as needed.   famotidine (PEPCID) 40 MG tablet Take 1 tablet (40 mg total) by mouth every evening.   Flaxseed, Linseed, (FLAX SEED OIL) 1000 MG CAPS Take 1,000 mg by mouth daily.   fluticasone (FLONASE) 50 MCG/ACT nasal spray Place 2 sprays into both nostrils daily.   gabapentin (NEURONTIN) 300 MG capsule Take 1 capsule (300 mg total) by mouth 2 (two) times daily as needed.   hydrochlorothiazide (HYDRODIURIL) 25 MG tablet Take  1 tablet  Daily  for BP & Fluid Retention /Ankle Swelling   loratadine (EQ LORATADINE) 10 MG tablet TAKE 1 TABLET BY MOUTH DAILY FOR ALLERGIES   Omega-3 Fatty Acids (FISH OIL) 1000 MG CAPS Take 1,000 mg by mouth daily.   pravastatin (PRAVACHOL) 20 MG tablet Take     1 tablet       at Bedtime        for Cholesterol   sildenafil (VIAGRA) 100 MG tablet Take 1/2 to 1 tablet /day  ONLY if needed for XXXX   acyclovir (  ZOVIRAX) 800 MG tablet TAKE ONE TABLET BY MOUTH DAILY FOR FEVER BLISTERS AND COLD SORES (Patient not taking: No sig reported)   HYDROcodone-acetaminophen (NORCO/VICODIN) 5-325 MG tablet Take 1 tablet by mouth every 4 (four) hours as needed for moderate pain. (Patient not taking: Reported on 09/06/2020)   No current facility-administered medications for this visit. (Other)      REVIEW OF SYSTEMS: ROS   Positive for: Eyes, Respiratory Negative for: Constitutional, Gastrointestinal, Neurological, Skin, Genitourinary, Musculoskeletal, HENT, Endocrine, Cardiovascular, Psychiatric, Allergic/Imm, Heme/Lymph Last edited by Leonie Douglas, COA on 09/06/2020  8:17 AM.         ALLERGIES Allergies  Allergen Reactions   Ace  Inhibitors     REACTION: cough   Trilipix [Choline Fenofibrate] Other (See Comments)    Heart burn    PAST MEDICAL HISTORY Past Medical History:  Diagnosis Date   GERD (gastroesophageal reflux disease)    Gout    HSV-1 (herpes simplex virus 1) infection    HSV-2 (herpes simplex virus 2) infection    Hyperlipidemia    Hypertension    Hypogonadism male    Obese    Pneumonia    walking -many years ago   Pre-diabetes    Past Surgical History:  Procedure Laterality Date   BACK SURGERY  2012   on back-hernia   GAS INSERTION Right 08/30/2020   Procedure: INSERTION OF GAS - C3F8;  Surgeon: Bernarda Caffey, MD;  Location: [facility];  Service: Ophthalmology;  Laterality: Right;   GAS/FLUID EXCHANGE Right 08/30/2020   Procedure: GAS/FLUID EXCHANGE;  Surgeon: Bernarda Caffey, MD;  Location: [facility];  Service: Ophthalmology;  Laterality: Right;   HERNIA REPAIR     INSERTION OF MESH N/A 08/15/2013   Procedure: INSERTION OF MESH;  Surgeon: Odis Hollingshead, MD;  Location: [facility];  Service: General;  Laterality: N/A;   LAPAROSCOPIC INGUINAL HERNIA WITH UMBILICAL HERNIA Right 1/51/7616   Procedure: LAPAROSCOPIC RIGH  INGUINAL HERNIA WITH OPEN UMBILICAL HERNIA;  Surgeon: Odis Hollingshead, MD;  Location: [facility];  Service: General;  Laterality: Right;  umbilical repair with mesh   PERFLUORONE INJECTION Right 08/30/2020   Procedure: PERFLUORONE INJECTION;  Surgeon: Bernarda Caffey, MD;  Location: [facility];  Service: Ophthalmology;  Laterality: Right;   PHOTOCOAGULATION WITH LASER Right 08/30/2020   Procedure: PHOTOCOAGULATION WITH LASER;  Surgeon: Bernarda Caffey, MD;  Location: [facility];  Service: Ophthalmology;  Laterality: Right;   VITRECTOMY 25 GAUGE WITH SCLERAL BUCKLE Right 08/30/2020   Procedure: SCLERAL BUCKLE WITH 25 GAUGE PARS PLANA VITRECTOMY;  Surgeon: Bernarda Caffey, MD;  Location: [facility];  Service: Ophthalmology;  Laterality: Right;    FAMILY HISTORY Family History  Problem Relation Age of Onset   Diabetes  Mother    Hypertension Mother    Hypertension Brother    Hypertension Brother    Hypertension Brother    Colon cancer Brother 51   Colon polyps Neg Hx    Esophageal cancer Neg Hx    Rectal cancer Neg Hx    Stomach cancer Neg Hx     SOCIAL HISTORY Social History   Tobacco Use   Smoking status: Former    Types: Cigarettes    Quit date: 02/23/2002    Years since quitting: 18.5   Smokeless tobacco: Never  Vaping Use   Vaping Use: Never used  Substance Use Topics   Alcohol use: No   Drug use: No         OPHTHALMIC EXAM:  Base Eye Exam  Visual Acuity (Snellen - Linear)       Right Left   Dist [place] HM 20/25-         Tonometry (Tonopen, 8:22 AM)       Right Left   Pressure 14          Pupils       Dark Light Shape React APD   Right 5 5 Round Dilated    Left 4 3 Round Brisk None         Neuro/Psych     Oriented x3: Yes   Mood/Affect: Normal         Dilation     dilated w/ atropine prior to visit            Slit Lamp and Fundus Exam     Slit Lamp Exam       Right Left   Lids/Lashes Dermatochalasis - upper lid Dermatochalasis - upper lid   Conjunctiva/Sclera melanosis; subconj heme improving, sutures intact melanosis   Cornea arcus, central epi defect closed, 3+ fine Punctate epithelial erosions, trace Descemet's folds arcus, 2+ fine PEE   Anterior Chamber deep and clear; narrow angles deep and clear   Iris round and dilated, no NVI round and dilated, no NVI   Lens 2+ NS, 2+CS; 1+ PC feathering 2+ NS, 2+CS   Vitreous post vitrectomy; good gas fill syneresis, +pigment         Fundus Exam       Right Left   Disc very hazy view; perfused pink and sharp   C/D Ratio  0.2   Macula very hazy view; grossly flat flat, good foveal reflex, mild RPE mottling, no heme or edema   Vessels attenuated attenuated   Periphery hazy view; retina attached over buckle; good buckle height; good laser over buckle; ORIGINALLY: Bullous detachment from 1030  to 0130, multiple retinal holes and tears at 1100, 1200, and 0115 attached, no heme, no RT/RD on 360 depression            IMAGING AND PROCEDURES  Imaging and Procedures for 09/06/2020            ASSESSMENT/PLAN:    ICD-10-CM   1. Right retinal detachment  H33.21     2. Essential hypertension  I10     3. Hypertensive retinopathy of both eyes  H35.033     4. Combined forms of age-related cataract of both eyes  H25.813        1 Rhegmatogenous retinal detachment with multiple holes/tears OD - bullous superior mac off detachment, onset of foveal involvement, 08/29/20 by history - detached from 1030 to 0130, with multiple retinal holes/tears at 1100,1200, and 0115--posterior extension of SRF through fovea/macula - now POW1 s/p #41 SBP + 25g PPV/PFO/EL/C3F8 OD, 07.08.22 - doing well  - retina attached and in good position -- good buckle height and laser around breaks             - IOP 14             - continue PF 6x/day OD                          zymaxid QID OD -- stop on Monday, July 18                         Atropine BID OD  PSO ung QID OD              - cont face down positioning; avoid laying flat on back              - eye shield when sleeping x1 more week             - post op drop and positioning instructions reviewed              - tylenol/ibuprofen for pain              - Rx given for breakthrough pain - f/u 2-3 weeks, DFE, OCT  2,3. Hypertensive retinopathy OU - discussed importance of tight BP control - monitor  4. Age related cataracts OU - The symptoms of cataract, surgical options, and treatments and risks were discussed with patient. - discussed diagnosis and progression - monitor   Ophthalmic Meds Ordered this visit:  Meds ordered this encounter  Medications   prednisoLONE acetate (PRED FORTE) 1 % ophthalmic suspension    Sig: Place 1 drop into the right eye 4 (four) times daily.    Dispense:  15 mL    Refill:  0    bacitracin-polymyxin b (POLYSPORIN) ophthalmic ointment    Sig: Place into the right eye at bedtime. Place a 1/2 inch ribbon of ointment into the lower eyelid.    Dispense:  3.5 g    Refill:  0       Return for f/u 2-3 weeks, RD OD, DFE, OCT.  There are no Patient Instructions on file for this visit.   Explained the diagnoses, plan, and follow up with the patient and they expressed understanding.  Patient expressed understanding of the importance of proper follow up care.  This document serves as a record of services personally performed by Gardiner Sleeper, MD, PhD. It was created on their behalf by San Jetty. Owens Shark, OA an ophthalmic technician. The creation of this record is the provider's dictation and/or activities during the visit.    Electronically signed by: San Jetty. Marguerita Merles 07.11.2022 9:29 PM   Gardiner Sleeper, M.D., Ph.D. Diseases & Surgery of the Retina and Vitreous [facility]  I have reviewed the above documentation for accuracy and completeness, and I agree with the above. Gardiner Sleeper, M.D., Ph.D. 09/08/20 9:32 PM   Abbreviations: M myopia (nearsighted); A astigmatism; H hyperopia (farsighted); P presbyopia; Mrx spectacle prescription;  CTL contact lenses; OD right eye; OS left eye; OU both eyes  XT exotropia; ET esotropia; PEK punctate epithelial keratitis; PEE punctate epithelial erosions; DES dry eye syndrome; MGD meibomian gland dysfunction; ATs artificial tears; PFAT's preservative free artificial tears; [place] nuclear sclerotic cataract; PSC posterior subcapsular cataract; ERM epi-retinal membrane; PVD posterior vitreous detachment; RD retinal detachment; DM diabetes mellitus; DR diabetic retinopathy; NPDR non-proliferative diabetic retinopathy; PDR proliferative diabetic retinopathy; CSME clinically significant macular edema; DME diabetic macular edema; dbh dot blot hemorrhages; CWS cotton wool spot; POAG primary open angle glaucoma; C/D  cup-to-disc ratio; HVF humphrey visual field; GVF goldmann visual field; OCT optical coherence tomography; IOP intraocular pressure; BRVO Branch retinal vein occlusion; CRVO central retinal vein occlusion; CRAO central retinal artery occlusion; BRAO branch retinal artery occlusion; RT retinal tear; SB scleral buckle; PPV pars plana vitrectomy; VH Vitreous hemorrhage; PRP panretinal laser photocoagulation; IVK intravitreal kenalog; VMT vitreomacular traction; MH Macular hole;  NVD neovascularization of the disc; NVE neovascularization elsewhere; AREDS age related eye disease study; ARMD age related  macular degeneration; POAG primary open angle glaucoma; EBMD epithelial/anterior basement membrane dystrophy; ACIOL anterior chamber intraocular lens; IOL intraocular lens; PCIOL posterior chamber intraocular lens; Phaco/IOL phacoemulsification with intraocular lens placement; [place] photorefractive keratectomy; LASIK laser assisted in situ keratomileusis; HTN hypertension; DM diabetes mellitus; COPD chronic obstructive pulmonary disease

## 2020-09-04 ENCOUNTER — Encounter (HOSPITAL_COMMUNITY): Payer: Self-pay | Admitting: Ophthalmology

## 2020-09-06 ENCOUNTER — Other Ambulatory Visit: Payer: Self-pay

## 2020-09-06 ENCOUNTER — Encounter (INDEPENDENT_AMBULATORY_CARE_PROVIDER_SITE_OTHER): Payer: Self-pay | Admitting: Ophthalmology

## 2020-09-06 ENCOUNTER — Ambulatory Visit (INDEPENDENT_AMBULATORY_CARE_PROVIDER_SITE_OTHER): Payer: BC Managed Care – PPO | Admitting: Ophthalmology

## 2020-09-06 DIAGNOSIS — H3321 Serous retinal detachment, right eye: Secondary | ICD-10-CM

## 2020-09-06 DIAGNOSIS — H25813 Combined forms of age-related cataract, bilateral: Secondary | ICD-10-CM

## 2020-09-06 DIAGNOSIS — I10 Essential (primary) hypertension: Secondary | ICD-10-CM

## 2020-09-06 DIAGNOSIS — H35033 Hypertensive retinopathy, bilateral: Secondary | ICD-10-CM

## 2020-09-06 MED ORDER — BACITRACIN-POLYMYXIN B 500-10000 UNIT/GM OP OINT: TOPICAL_OINTMENT | Freq: Every day | OPHTHALMIC | 0 refills | Status: DC

## 2020-09-06 MED ORDER — PREDNISOLONE ACETATE 1 % OP SUSP: 1.0000 [drp] | Freq: Four times a day (QID) | OPHTHALMIC | 0 refills | Status: DC

## 2020-09-17 NOTE — Progress Notes (Addendum)
[facility] Note  09/20/2020     CHIEF COMPLAINT Patient presents for Retina Follow Up   HISTORY OF PRESENT ILLNESS: Caleb Johns is a 65 y.o. male who presents to the clinic today for:   HPI     Retina Follow Up   Patient presents with  Retinal Break/Detachment.  In right eye.  Duration of 3 weeks.  Since onset it is gradually improving.  I, the attending physician,  performed the HPI with the patient and updated documentation appropriately.        Comments   3 week post op RD repair OD- When looking down he is able to see.  Unable straight ahead.  Patient states he is doing well. Only using Prednisolone QID OD and ung as needed.      Last edited by Bernarda Caffey, MD on 09/20/2020  9:04 AM.    Pt can still see the gas bubble OD, but is beginning to be able to see over it a bit.  Pt doing well overall.  Pt maintaining face-down positioning as much as possible.  Referring physician: Shirleen Schirmer [place] [address] [place], [place] [address]  HISTORICAL INFORMATION:   Selected notes from the MEDICAL RECORD NUMBER Referred by Shirleen Schirmer, PA for RD OD [place]: 08/29/2020 Ocular Hx- RD OD BCVA OD CF OS 20/25 PMH- gout, HTN   CURRENT MEDICATIONS: Current Outpatient Medications (Ophthalmic Drugs)  Medication Sig   bacitracin-polymyxin b (POLYSPORIN) ophthalmic ointment Place into the right eye at bedtime. Place a 1/2 inch ribbon of ointment into the lower eyelid.   prednisoLONE acetate (PRED FORTE) 1 % ophthalmic suspension Place 1 drop into the right eye 4 (four) times daily.   No current facility-administered medications for this visit. (Ophthalmic Drugs)   Current Outpatient Medications (Other)  Medication Sig   allopurinol (ZYLOPRIM) 300 MG tablet Take 1 tablet Daily to Prevent Gout   alum & mag hydroxide-simeth (MAALOX/MYLANTA) 200-200-20 MG/5ML suspension Take 30 mLs by mouth 2 (two) times daily.   aspirin EC 81 MG tablet Take 81  mg by mouth daily.   atenolol (TENORMIN) 100 MG tablet Take 1 tablet by mouth once daily for blood pressure   B Complex-C (SUPER B COMPLEX PO) Take 1 tablet by mouth daily.   Cholecalciferol (VITAMIN D) 2000 UNITS CAPS Take 4,000 Units by mouth daily.   cyclobenzaprine (FLEXERIL) 10 MG tablet Take 10 mg by mouth at bedtime as needed.   famotidine (PEPCID) 40 MG tablet Take 1 tablet (40 mg total) by mouth every evening.   Flaxseed, Linseed, (FLAX SEED OIL) 1000 MG CAPS Take 1,000 mg by mouth daily.   fluticasone (FLONASE) 50 MCG/ACT nasal spray Place 2 sprays into both nostrils daily.   gabapentin (NEURONTIN) 300 MG capsule Take 1 capsule (300 mg total) by mouth 2 (two) times daily as needed.   hydrochlorothiazide (HYDRODIURIL) 25 MG tablet Take  1 tablet  Daily  for BP & Fluid Retention /Ankle Swelling   loratadine (EQ LORATADINE) 10 MG tablet TAKE 1 TABLET BY MOUTH DAILY FOR ALLERGIES   Omega-3 Fatty Acids (FISH OIL) 1000 MG CAPS Take 1,000 mg by mouth daily.   pravastatin (PRAVACHOL) 20 MG tablet Take     1 tablet       at Bedtime        for Cholesterol   sildenafil (VIAGRA) 100 MG tablet Take 1/2 to 1 tablet /day  ONLY if needed for XXXX  acyclovir (ZOVIRAX) 800 MG tablet TAKE ONE TABLET BY MOUTH DAILY FOR FEVER BLISTERS AND COLD SORES (Patient not taking: No sig reported)   HYDROcodone-acetaminophen (NORCO/VICODIN) 5-325 MG tablet Take 1 tablet by mouth every 4 (four) hours as needed for moderate pain. (Patient not taking: No sig reported)   No current facility-administered medications for this visit. (Other)   REVIEW OF SYSTEMS: ROS   Positive for: Eyes, Respiratory Negative for: Constitutional, Gastrointestinal, Neurological, Skin, Genitourinary, Musculoskeletal, HENT, Endocrine, Cardiovascular, Psychiatric, Allergic/Imm, Heme/Lymph Last edited by Leonie Douglas, COA on 09/20/2020  8:28 AM.     ALLERGIES Allergies  Allergen Reactions   Ace Inhibitors     REACTION: cough   Trilipix  [Choline Fenofibrate] Other (See Comments)    Heart burn    PAST MEDICAL HISTORY Past Medical History:  Diagnosis Date   GERD (gastroesophageal reflux disease)    Gout    HSV-1 (herpes simplex virus 1) infection    HSV-2 (herpes simplex virus 2) infection    Hyperlipidemia    Hypertension    Hypogonadism male    Obese    Pneumonia    walking -many years ago   Pre-diabetes    Past Surgical History:  Procedure Laterality Date   BACK SURGERY  2012   on back-hernia   GAS INSERTION Right 08/30/2020   Procedure: INSERTION OF GAS - C3F8;  Surgeon: Bernarda Caffey, MD;  Location: [place];  Service: Ophthalmology;  Laterality: Right;   GAS/FLUID EXCHANGE Right 08/30/2020   Procedure: GAS/FLUID EXCHANGE;  Surgeon: Bernarda Caffey, MD;  Location: [place];  Service: Ophthalmology;  Laterality: Right;   HERNIA REPAIR     INSERTION OF MESH N/A 08/15/2013   Procedure: INSERTION OF MESH;  Surgeon: Odis Hollingshead, MD;  Location: [facility];  Service: General;  Laterality: N/A;   LAPAROSCOPIC INGUINAL HERNIA WITH UMBILICAL HERNIA Right 06/01/8117   Procedure: LAPAROSCOPIC RIGH  INGUINAL HERNIA WITH OPEN UMBILICAL HERNIA;  Surgeon: Odis Hollingshead, MD;  Location: [facility];  Service: General;  Laterality: Right;  umbilical repair with mesh   PERFLUORONE INJECTION Right 08/30/2020   Procedure: PERFLUORONE INJECTION;  Surgeon: Bernarda Caffey, MD;  Location: [place];  Service: Ophthalmology;  Laterality: Right;   PHOTOCOAGULATION WITH LASER Right 08/30/2020   Procedure: PHOTOCOAGULATION WITH LASER;  Surgeon: Bernarda Caffey, MD;  Location: [place];  Service: Ophthalmology;  Laterality: Right;   VITRECTOMY 25 GAUGE WITH SCLERAL BUCKLE Right 08/30/2020   Procedure: SCLERAL BUCKLE WITH 25 GAUGE PARS PLANA VITRECTOMY;  Surgeon: Bernarda Caffey, MD;  Location: [facility];  Service: Ophthalmology;  Laterality: Right;    FAMILY HISTORY Family History  Problem Relation Age of Onset   Diabetes Mother    Hypertension Mother     Hypertension Brother    Hypertension Brother    Hypertension Brother    Colon cancer Brother 15   Colon polyps Neg Hx    Esophageal cancer Neg Hx    Rectal cancer Neg Hx    Stomach cancer Neg Hx     SOCIAL HISTORY Social History   Tobacco Use   Smoking status: Former    Types: Cigarettes    Quit date: 02/23/2002    Years since quitting: 18.5   Smokeless tobacco: Never  Vaping Use   Vaping Use: Never used  Substance Use Topics   Alcohol use: No   Drug use: No         OPHTHALMIC EXAM:  Base Eye Exam     Visual Acuity (Snellen -  Linear)       Right Left   Dist [place] HM 3' 20/25 +1         Tonometry (Tonopen, 8:55 AM)       Right Left   Pressure 9 Def         Pupils       Dark Light Shape React APD   Right 5 5 Round NR    Left 3 2 Round Brisk None         Visual Fields (Counting fingers)       Left Right    Full Full         Extraocular Movement       Right Left    Full Full         Neuro/Psych     Oriented x3: Yes   Mood/Affect: Normal         Dilation     Right eye: 1.0% Mydriacyl, 2.5% Phenylephrine @ 8:33 AM           Slit Lamp and Fundus Exam     Slit Lamp Exam       Right Left   Lids/Lashes Dermatochalasis - upper lid Dermatochalasis - upper lid   Conjunctiva/Sclera melanosis; subconj heme improved, sutures intact melanosis   Cornea arcus, , 2+ fine Punctate epithelial erosions, fine endopigment inferiorly arcus, 2+ fine PEE   Anterior Chamber deep and clear; narrow angles deep and clear   Iris round and dilated, no NVI round and dilated, no NVI   Lens 2+ NS, 2+CS; 1+ PSC 2+ NS, 2+CS   Vitreous post vitrectomy; gas bubble 55-60% syneresis, +pigment         Fundus Exam       Right Left   Disc Mild pallor pink and sharp   C/D Ratio  0.2   Macula flat under bubble flat, good foveal reflex, mild RPE mottling, no heme or edema   Vessels attenuated, mild av crossing changes attenuated   Periphery retina attached  over buckle; good buckle height; good laser over buckle; ORIGINALLY: Bullous detachment from 1030 to 0130, multiple retinal holes and tears at 1100, 1200, and 0115 attached, no heme, no RT/RD on 360 depression            IMAGING AND PROCEDURES  Imaging and Procedures for 09/20/2020            ASSESSMENT/PLAN:    ICD-10-CM   1. Right retinal detachment  H33.21     2. Essential hypertension  I10     3. Hypertensive retinopathy of both eyes  H35.033     4. 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 - bullous superior mac off detachment, onset of foveal involvement, 08/29/20 by history - detached from 1030 to 0130, with multiple retinal holes/tears at 1100,1200, and 0115--posterior extension of SRF through fovea/macula - s/p #41 SBP + 25g PPV/PFO/EL/C3F8 OD, 07.08.22 - doing well  - retina attached and in good position -- good buckle height and laser around breaks             - IOP 9             - continue PF 4x/day OD              - PSO ung prn OD              - cont face down  positioning 50% of time; avoid laying flat on back              - post op drop and positioning instructions reviewed              - tylenol/ibuprofen for pain              - Rx given for breakthrough pain - f/u 4 weeks, DFE, OCT  2,3. Hypertensive retinopathy OU - discussed importance of tight BP control - monitor  4. Age related cataracts OU - The symptoms of cataract, surgical options, and treatments and risks were discussed with patient. - discussed diagnosis and progression - monitor  Ophthalmic Meds Ordered this visit:  Meds ordered this encounter  Medications   prednisoLONE acetate (PRED FORTE) 1 % ophthalmic suspension    Sig: Place 1 drop into the right eye 4 (four) times daily.    Dispense:  10 mL    Refill:  0       Return in about 4 weeks (around 10/18/2020) for 4 wk POV OD  w/DFE&OCT.  There are no Patient Instructions on file for this visit.  This document serves as a record of services personally performed by Gardiner Sleeper, MD, PhD. It was created on their behalf by Estill Bakes, COT an ophthalmic technician. The creation of this record is the provider's dictation and/or activities during the visit.    Electronically signed by: Estill Bakes, COT 7.29.22 @ 11:17 PM   Gardiner Sleeper, M.D., Ph.D. Diseases & Surgery of the Retina and [place] 09/20/2020   I have reviewed the above documentation for accuracy and completeness, and I agree with the above. Gardiner Sleeper, M.D., Ph.D. 09/20/20 11:17 PM  Abbreviations: M myopia (nearsighted); A astigmatism; H hyperopia (farsighted); P presbyopia; Mrx spectacle prescription;  CTL contact lenses; OD right eye; OS left eye; OU both eyes  XT exotropia; ET esotropia; PEK punctate epithelial keratitis; PEE punctate epithelial erosions; DES dry eye syndrome; MGD meibomian gland dysfunction; ATs artificial tears; PFAT's preservative free artificial tears; [place] nuclear sclerotic cataract; PSC posterior subcapsular cataract; ERM epi-retinal membrane; PVD posterior vitreous detachment; RD retinal detachment; DM diabetes mellitus; DR diabetic retinopathy; NPDR non-proliferative diabetic retinopathy; PDR proliferative diabetic retinopathy; CSME clinically significant macular edema; DME diabetic macular edema; dbh dot blot hemorrhages; CWS cotton wool spot; POAG primary open angle glaucoma; C/D cup-to-disc ratio; HVF humphrey visual field; GVF goldmann visual field; OCT optical coherence tomography; IOP intraocular pressure; BRVO Branch retinal vein occlusion; CRVO central retinal vein occlusion; CRAO central retinal artery occlusion; BRAO branch retinal artery occlusion; RT retinal tear; SB scleral buckle; PPV pars plana vitrectomy; VH Vitreous hemorrhage; PRP panretinal laser photocoagulation; IVK  intravitreal kenalog; VMT vitreomacular traction; MH Macular hole;  NVD neovascularization of the disc; NVE neovascularization elsewhere; AREDS age related eye disease study; ARMD age related macular degeneration; POAG primary open angle glaucoma; EBMD epithelial/anterior basement membrane dystrophy; ACIOL anterior chamber intraocular lens; IOL intraocular lens; PCIOL posterior chamber intraocular lens; Phaco/IOL phacoemulsification with intraocular lens placement; [place] photorefractive keratectomy; LASIK laser assisted in situ keratomileusis; HTN hypertension; DM diabetes mellitus; COPD chronic obstructive pulmonary disease

## 2020-09-20 ENCOUNTER — Other Ambulatory Visit: Payer: Self-pay

## 2020-09-20 ENCOUNTER — Encounter (INDEPENDENT_AMBULATORY_CARE_PROVIDER_SITE_OTHER): Payer: Self-pay | Admitting: Ophthalmology

## 2020-09-20 ENCOUNTER — Ambulatory Visit (INDEPENDENT_AMBULATORY_CARE_PROVIDER_SITE_OTHER): Payer: BC Managed Care – PPO | Admitting: Ophthalmology

## 2020-09-20 DIAGNOSIS — H35033 Hypertensive retinopathy, bilateral: Secondary | ICD-10-CM

## 2020-09-20 DIAGNOSIS — H3321 Serous retinal detachment, right eye: Secondary | ICD-10-CM

## 2020-09-20 DIAGNOSIS — I10 Essential (primary) hypertension: Secondary | ICD-10-CM

## 2020-09-20 DIAGNOSIS — H25813 Combined forms of age-related cataract, bilateral: Secondary | ICD-10-CM

## 2020-09-20 DIAGNOSIS — H3581 Retinal edema: Secondary | ICD-10-CM

## 2020-09-20 MED ORDER — PREDNISOLONE ACETATE 1 % OP SUSP: 1.0000 [drp] | Freq: Four times a day (QID) | OPHTHALMIC | 0 refills | Status: DC

## 2020-09-30 ENCOUNTER — Other Ambulatory Visit (INDEPENDENT_AMBULATORY_CARE_PROVIDER_SITE_OTHER): Payer: Self-pay

## 2020-09-30 MED ORDER — PREDNISOLONE ACETATE 1 % OP SUSP: 1.0000 [drp] | Freq: Four times a day (QID) | OPHTHALMIC | 0 refills | Status: DC

## 2020-10-14 NOTE — Progress Notes (Signed)
[facility] Note  10/18/2020     CHIEF COMPLAINT Patient presents for Post-op Follow-up   HISTORY OF PRESENT ILLNESS: Caleb Johns is a 65 y.o. male who presents to the clinic today for:   HPI     Post-op Follow-up   In right eye.  Vision is blurred at distance.  I, the attending physician,  performed the HPI with the patient and updated documentation appropriately.        Comments   7 week POV Patient states bubble is smaller, but vision is still blurred      Last edited by Bernarda Caffey, MD on 10/23/2020 11:23 AM.     Patient states bubble getting smaller, but it looks "as big as a quarter." Vision is still blurred.   Referring physician: Shirleen Schirmer [place] [address] [place], [place] [address]  HISTORICAL INFORMATION:   Selected notes from the MEDICAL RECORD NUMBER Referred by Shirleen Schirmer, PA for RD OD LEE: 08/29/2020 Ocular Hx- RD OD BCVA OD CF OS 20/25 PMH- gout, HTN   CURRENT MEDICATIONS: Current Outpatient Medications (Ophthalmic Drugs)  Medication Sig   bacitracin-polymyxin b (POLYSPORIN) ophthalmic ointment Place into the right eye at bedtime. Place a 1/2 inch ribbon of ointment into the lower eyelid.   prednisoLONE acetate (PRED FORTE) 1 % ophthalmic suspension Place 1 drop into the right eye 4 (four) times daily.   No current facility-administered medications for this visit. (Ophthalmic Drugs)   Current Outpatient Medications (Other)  Medication Sig   acyclovir (ZOVIRAX) 800 MG tablet Take 1 tablet  Daily  to Prevent Fever Blisters & Cold Sores  /  Patient knows to take by mouth   allopurinol (ZYLOPRIM) 300 MG tablet Take 1 tablet Daily to Prevent Gout   alum & mag hydroxide-simeth (MAALOX/MYLANTA) 200-200-20 MG/5ML suspension Take 30 mLs by mouth 2 (two) times daily.   aspirin EC 81 MG tablet Take 81 mg by mouth daily.   atenolol (TENORMIN) 100 MG tablet Take 1 tablet  Daily for BP /  Patient knows to take by  mouth   B Complex-C (SUPER B COMPLEX PO) Take 1 tablet by mouth daily.   Cholecalciferol (VITAMIN D) 2000 UNITS CAPS Take 4,000 Units by mouth daily.   cyclobenzaprine (FLEXERIL) 10 MG tablet Take 10 mg by mouth at bedtime as needed.   famotidine (PEPCID) 40 MG tablet Take 1 tablet (40 mg total) by mouth every evening.   Flaxseed, Linseed, (FLAX SEED OIL) 1000 MG CAPS Take 1,000 mg by mouth daily.   fluticasone (FLONASE) 50 MCG/ACT nasal spray Place 2 sprays into both nostrils daily.   gabapentin (NEURONTIN) 300 MG capsule Take 1 capsule (300 mg total) by mouth 2 (two) times daily as needed.   hydrochlorothiazide (HYDRODIURIL) 25 MG tablet Take  1 tablet  Daily  for BP & Fluid Retention /Ankle Swelling - Patient knows to take by mouth   loratadine (EQ LORATADINE) 10 MG tablet TAKE 1 TABLET BY MOUTH DAILY FOR ALLERGIES   Omega-3 Fatty Acids (FISH OIL) 1000 MG CAPS Take 1,000 mg by mouth daily.   pravastatin (PRAVACHOL) 20 MG tablet Take     1 tablet       at Bedtime        for Cholesterol   sildenafil (VIAGRA) 100 MG tablet Take 1/2 to 1 tablet /day  ONLY if needed for XXXX   No current facility-administered medications for this visit. (Other)   REVIEW  OF SYSTEMS: ROS   Positive for: Eyes, Respiratory Negative for: Constitutional, Gastrointestinal, Neurological, Skin, Genitourinary, Musculoskeletal, HENT, Endocrine, Cardiovascular, Psychiatric, Allergic/Imm, Heme/Lymph Last edited by Elmore Guise, COT on 10/18/2020  8:32 AM.      ALLERGIES Allergies  Allergen Reactions   Ace Inhibitors     REACTION: cough   Trilipix [Choline Fenofibrate] Other (See Comments)    Heart burn    PAST MEDICAL HISTORY Past Medical History:  Diagnosis Date   GERD (gastroesophageal reflux disease)    Gout    HSV-1 (herpes simplex virus 1) infection    HSV-2 (herpes simplex virus 2) infection    Hyperlipidemia    Hypertension    Hypogonadism male    Obese    Pneumonia    walking -many years ago    Pre-diabetes    Past Surgical History:  Procedure Laterality Date   BACK SURGERY  2012   on back-hernia   GAS INSERTION Right 08/30/2020   Procedure: INSERTION OF GAS - C3F8;  Surgeon: Bernarda Caffey, MD;  Location: [facility];  Service: Ophthalmology;  Laterality: Right;   GAS/FLUID EXCHANGE Right 08/30/2020   Procedure: GAS/FLUID EXCHANGE;  Surgeon: Bernarda Caffey, MD;  Location: [facility];  Service: Ophthalmology;  Laterality: Right;   HERNIA REPAIR     INSERTION OF MESH N/A 08/15/2013   Procedure: INSERTION OF MESH;  Surgeon: Odis Hollingshead, MD;  Location: [facility];  Service: General;  Laterality: N/A;   LAPAROSCOPIC INGUINAL HERNIA WITH UMBILICAL HERNIA Right 3/42/8768   Procedure: LAPAROSCOPIC RIGH  INGUINAL HERNIA WITH OPEN UMBILICAL HERNIA;  Surgeon: Odis Hollingshead, MD;  Location: [facility];  Service: General;  Laterality: Right;  umbilical repair with mesh   PERFLUORONE INJECTION Right 08/30/2020   Procedure: PERFLUORONE INJECTION;  Surgeon: Bernarda Caffey, MD;  Location: [facility];  Service: Ophthalmology;  Laterality: Right;   PHOTOCOAGULATION WITH LASER Right 08/30/2020   Procedure: PHOTOCOAGULATION WITH LASER;  Surgeon: Bernarda Caffey, MD;  Location: [facility];  Service: Ophthalmology;  Laterality: Right;   VITRECTOMY 25 GAUGE WITH SCLERAL BUCKLE Right 08/30/2020   Procedure: SCLERAL BUCKLE WITH 25 GAUGE PARS PLANA VITRECTOMY;  Surgeon: Bernarda Caffey, MD;  Location: [facility];  Service: Ophthalmology;  Laterality: Right;    FAMILY HISTORY Family History  Problem Relation Age of Onset   Diabetes Mother    Hypertension Mother    Hypertension Brother    Hypertension Brother    Hypertension Brother    Colon cancer Brother 25   Colon polyps Neg Hx    Esophageal cancer Neg Hx    Rectal cancer Neg Hx    Stomach cancer Neg Hx     SOCIAL HISTORY Social History   Tobacco Use   Smoking status: Former    Types: Cigarettes    Quit date: 02/23/2002    Years since quitting: 18.6   Smokeless tobacco: Never   Vaping Use   Vaping Use: Never used  Substance Use Topics   Alcohol use: No   Drug use: No         OPHTHALMIC EXAM:  Base Eye Exam     Visual Acuity (Snellen - Linear)       Right Left   Dist [place] 20/CF 20/25   Dist ph [place] 20/70-2 20/20         Tonometry (Tonopen, 8:44 AM)       Right Left   Pressure 11 10         Pupils  Dark Light Shape React APD   Right 5 5 Round Minimal None   Left 3 2 Round Brisk None         Visual Fields (Counting fingers)       Left Right    Full Full         Extraocular Movement       Right Left    Full, Ortho Full, Ortho         Neuro/Psych     Oriented x3: Yes   Mood/Affect: Normal         Dilation     Both eyes: 1.0% Mydriacyl, 2.5% Phenylephrine @ 8:44 AM           Slit Lamp and Fundus Exam     Slit Lamp Exam       Right Left   Lids/Lashes Dermatochalasis - upper lid Dermatochalasis - upper lid   Conjunctiva/Sclera melanosis; subconj heme improved, sutures intact melanosis   Cornea arcus, , 1+ fine Punctate epithelial erosions, fine endopigment inferiorly arcus, 2+ fine PEE   Anterior Chamber deep and clear; narrow angles deep and clear   Iris round and dilated, no NVI round and dilated, no NVI   Lens 2+ NS, 2+CS; trace PSC 2+ NS, 2+CS   Vitreous post vitrectomy; gas bubble 25-30% syneresis, +pigment         Fundus Exam       Right Left   Disc pink and sharp    C/D Ratio 0.1 0.2   Macula Flat/re-attached,blunted foveal reflex, mild RPE mottling and clumping, no heme or edema    Vessels attenuated, mild av crossing changes    Periphery retina attached over buckle; good buckle height; good laser over buckle; ORIGINALLY: Bullous detachment from 1030 to 0130, multiple retinal holes and tears at 1100, 1200, and 0115             IMAGING AND PROCEDURES  Imaging and Procedures for 10/18/2020  OCT, Retina - OU - Both Eyes       Right Eye Quality was good. Central Foveal Thickness:  267. Progression has improved. Findings include no IRF, no SRF, normal foveal contour, outer retinal atrophy (Retina re-attached, patchy ellipsoid signal).   Left Eye Quality was good. Central Foveal Thickness: 255. Progression has been stable. Findings include normal foveal contour, no IRF, no SRF.   Notes *Images captured and stored on drive  Diagnosis / Impression:  OD: Retina re-attached, patchy ellipsoid signal OS: NFP, no IRF/SRF  Clinical management:  See below  Abbreviations: NFP - Normal foveal profile. CME - cystoid macular edema. PED - pigment epithelial detachment. IRF - intraretinal fluid. SRF - subretinal fluid. EZ - ellipsoid zone. ERM - epiretinal membrane. ORA - outer retinal atrophy. ORT - outer retinal tubulation. SRHM - subretinal hyper-reflective material. IRHM - intraretinal hyper-reflective material            ASSESSMENT/PLAN:    ICD-10-CM   1. Right retinal detachment  H33.21     2. Essential hypertension  I10     3. Hypertensive retinopathy of both eyes  H35.033     4. Combined forms of age-related cataract of both eyes  H25.813     5. Retinal edema  H35.81 OCT, Retina - OU - Both Eyes      1 Rhegmatogenous retinal detachment with multiple holes/tears OD - bullous superior mac off detachment, onset of foveal involvement, 08/29/20 by history - detached from 1030 to 0130, with multiple retinal holes/tears at 1100,1200,  and 0115--posterior extension of SRF through fovea/macula - s/p #41 SBP + 25g PPV/PFO/EL/C3F8 OD, 07.08.22 - doing well, BCVA 20/70-2 today (08.26.22) - retina attached and in good position -- good buckle height and laser around breaks             - IOP 11             - decrease PF to 3x/day OD              - PSO ung prn OD              - cont face down positioning 50% of time; avoid laying flat on back             - post op drop and positioning instructions reviewed              - tylenol/ibuprofen for pain              - Rx  given for breakthrough pain - f/u 11/01/2020, DFE, OCT  2,3. Hypertensive retinopathy OU - discussed importance of tight BP control - monitor   4. Age related cataracts OU - The symptoms of cataract, surgical options, and treatments and risks were discussed with patient. - discussed diagnosis and progression - monitor   Ophthalmic Meds Ordered this visit:  No orders of the defined types were placed in this encounter.     Return in about 2 weeks (around 11/01/2020) for DFE, OCT.  There are no Patient Instructions on file for this visit.  This document serves as a record of services personally performed by Gardiner Sleeper, MD, PhD. It was created on their behalf by Roselee Nova, COMT. The creation of this record is the provider's dictation and/or activities during the visit.  Electronically signed by: Roselee Nova, COMT 10/23/20 11:26 AM  Gardiner Sleeper, M.D., Ph.D. Diseases & Surgery of the Retina and [place] 10/18/2020  I have reviewed the above documentation for accuracy and completeness, and I agree with the above. Gardiner Sleeper, M.D., Ph.D. 10/23/20 11:26 AM   Abbreviations: M myopia (nearsighted); A astigmatism; H hyperopia (farsighted); P presbyopia; Mrx spectacle prescription;  CTL contact lenses; OD right eye; OS left eye; OU both eyes  XT exotropia; ET esotropia; PEK punctate epithelial keratitis; PEE punctate epithelial erosions; DES dry eye syndrome; MGD meibomian gland dysfunction; ATs artificial tears; PFAT's preservative free artificial tears; [place] nuclear sclerotic cataract; PSC posterior subcapsular cataract; ERM epi-retinal membrane; PVD posterior vitreous detachment; RD retinal detachment; DM diabetes mellitus; DR diabetic retinopathy; NPDR non-proliferative diabetic retinopathy; PDR proliferative diabetic retinopathy; CSME clinically significant macular edema; DME diabetic macular edema; dbh dot blot hemorrhages; CWS cotton wool  spot; POAG primary open angle glaucoma; C/D cup-to-disc ratio; HVF humphrey visual field; GVF goldmann visual field; OCT optical coherence tomography; IOP intraocular pressure; BRVO Branch retinal vein occlusion; CRVO central retinal vein occlusion; CRAO central retinal artery occlusion; BRAO branch retinal artery occlusion; RT retinal tear; SB scleral buckle; PPV pars plana vitrectomy; VH Vitreous hemorrhage; PRP panretinal laser photocoagulation; IVK intravitreal kenalog; VMT vitreomacular traction; MH Macular hole;  NVD neovascularization of the disc; NVE neovascularization elsewhere; AREDS age related eye disease study; ARMD age related macular degeneration; POAG primary open angle glaucoma; EBMD epithelial/anterior basement membrane dystrophy; ACIOL anterior chamber intraocular lens; IOL intraocular lens; PCIOL posterior chamber intraocular lens; Phaco/IOL phacoemulsification with intraocular lens placement; PRK photorefractive keratectomy; LASIK laser assisted in situ keratomileusis; HTN hypertension; DM diabetes  mellitus; COPD chronic obstructive pulmonary disease

## 2020-10-15 ENCOUNTER — Other Ambulatory Visit: Payer: Self-pay | Admitting: Internal Medicine

## 2020-10-15 ENCOUNTER — Other Ambulatory Visit: Payer: Self-pay | Admitting: Adult Health Nurse Practitioner

## 2020-10-15 DIAGNOSIS — I10 Essential (primary) hypertension: Secondary | ICD-10-CM

## 2020-10-15 DIAGNOSIS — B009 Herpesviral infection, unspecified: Secondary | ICD-10-CM

## 2020-10-15 MED ORDER — HYDROCHLOROTHIAZIDE 25 MG PO TABS
ORAL_TABLET | ORAL | 3 refills | Status: DC
Start: 2020-10-15 — End: 2022-01-18

## 2020-10-15 MED ORDER — ACYCLOVIR 800 MG PO TABS: ORAL_TABLET | ORAL | 0 refills | Status: DC

## 2020-10-15 MED ORDER — ATENOLOL 100 MG PO TABS: ORAL_TABLET | ORAL | 3 refills | Status: DC

## 2020-10-18 ENCOUNTER — Ambulatory Visit (INDEPENDENT_AMBULATORY_CARE_PROVIDER_SITE_OTHER): Payer: BC Managed Care – PPO | Admitting: Ophthalmology

## 2020-10-18 ENCOUNTER — Other Ambulatory Visit: Payer: Self-pay

## 2020-10-18 ENCOUNTER — Encounter (INDEPENDENT_AMBULATORY_CARE_PROVIDER_SITE_OTHER): Payer: Self-pay | Admitting: Ophthalmology

## 2020-10-18 DIAGNOSIS — I10 Essential (primary) hypertension: Secondary | ICD-10-CM

## 2020-10-18 DIAGNOSIS — H3321 Serous retinal detachment, right eye: Secondary | ICD-10-CM

## 2020-10-18 DIAGNOSIS — H3581 Retinal edema: Secondary | ICD-10-CM | POA: Diagnosis not present

## 2020-10-18 DIAGNOSIS — H35033 Hypertensive retinopathy, bilateral: Secondary | ICD-10-CM

## 2020-10-18 DIAGNOSIS — H25813 Combined forms of age-related cataract, bilateral: Secondary | ICD-10-CM

## 2020-10-23 ENCOUNTER — Encounter (INDEPENDENT_AMBULATORY_CARE_PROVIDER_SITE_OTHER): Payer: Self-pay | Admitting: Ophthalmology

## 2020-10-29 NOTE — Progress Notes (Signed)
[facility] Note  11/01/2020     CHIEF COMPLAINT Patient presents for Retina Follow Up   HISTORY OF PRESENT ILLNESS: Caleb Johns is a 65 y.o. male who presents to the clinic today for:   HPI     Retina Follow Up   Patient presents with  Retinal Break/Detachment.  In right eye.  Severity is mild.  Duration of 2 weeks.  Since onset it is stable.  I, the attending physician,  performed the HPI with the patient and updated documentation appropriately.        Comments   Pt here for 2 wk ret f/u for RD OD. Pt states gas bubble in OD has gone but his vision is still very blurry. Pt reports having ocular headaches/migraines, not sleeping well. Not seeing any floaters or flashes of light.       Last edited by Bernarda Caffey, MD on 11/02/2020 12:50 AM.    Pt states vision is still very blurry, but the gas bubble is gone, he states he is having bad headaches on the right side of his head, he states he is not ready to go back to work on Monday  Referring physician: Shirleen Schirmer PA [address], [place] [address]  HISTORICAL INFORMATION:   Selected notes from the [place] Referred by Shirleen Schirmer, PA for RD OD LEE: 08/29/2020 Ocular Hx- RD OD BCVA OD CF OS 20/25 PMH- gout, HTN   CURRENT MEDICATIONS: Current Outpatient Medications (Ophthalmic Drugs)  Medication Sig   bacitracin-polymyxin b (POLYSPORIN) ophthalmic ointment Place into the right eye at bedtime. Place a 1/2 inch ribbon of ointment into the lower eyelid.   prednisoLONE acetate (PRED FORTE) 1 % ophthalmic suspension Place 1 drop into the right eye 4 (four) times daily.   No current facility-administered medications for this visit. (Ophthalmic Drugs)   Current Outpatient Medications (Other)  Medication Sig   acyclovir (ZOVIRAX) 800 MG tablet Take 1 tablet  Daily  to Prevent Fever Blisters & Cold Sores  /  Patient knows to take by mouth   allopurinol  (ZYLOPRIM) 300 MG tablet Take 1 tablet Daily to Prevent Gout   alum & mag hydroxide-simeth (MAALOX/MYLANTA) 200-200-20 MG/5ML suspension Take 30 mLs by mouth 2 (two) times daily.   aspirin EC 81 MG tablet Take 81 mg by mouth daily.   atenolol (TENORMIN) 100 MG tablet Take 1 tablet  Daily for BP /  Patient knows to take by mouth   B Complex-C (SUPER B COMPLEX PO) Take 1 tablet by mouth daily.   Cholecalciferol (VITAMIN D) 2000 UNITS CAPS Take 4,000 Units by mouth daily.   cyclobenzaprine (FLEXERIL) 10 MG tablet Take 10 mg by mouth at bedtime as needed.   famotidine (PEPCID) 40 MG tablet Take 1 tablet (40 mg total) by mouth every evening.   Flaxseed, Linseed, (FLAX SEED OIL) 1000 MG CAPS Take 1,000 mg by mouth daily.   fluticasone (FLONASE) 50 MCG/ACT nasal spray Place 2 sprays into both nostrils daily.   gabapentin (NEURONTIN) 300 MG capsule Take 1 capsule (300 mg total) by mouth 2 (two) times daily as needed.   hydrochlorothiazide (HYDRODIURIL) 25 MG tablet Take  1 tablet  Daily  for BP & Fluid Retention /Ankle Swelling - Patient knows to take by mouth   loratadine (EQ LORATADINE) 10 MG tablet TAKE 1 TABLET BY MOUTH DAILY FOR ALLERGIES   Omega-3 Fatty Acids (FISH OIL) 1000 MG CAPS Take  1,000 mg by mouth daily.   pravastatin (PRAVACHOL) 20 MG tablet Take     1 tablet       at Bedtime        for Cholesterol   sildenafil (VIAGRA) 100 MG tablet Take 1/2 to 1 tablet /day  ONLY if needed for XXXX   No current facility-administered medications for this visit. (Other)   REVIEW OF SYSTEMS: ROS   Positive for: Eyes, Respiratory Negative for: Constitutional, Gastrointestinal, Neurological, Skin, Genitourinary, Musculoskeletal, HENT, Endocrine, Cardiovascular, Psychiatric, Allergic/Imm, Heme/Lymph Last edited by Kingsley Spittle, COT on 11/01/2020 12:53 PM.       ALLERGIES Allergies  Allergen Reactions   Ace Inhibitors     REACTION: cough   Trilipix [Choline Fenofibrate] Other (See Comments)     Heart burn    PAST MEDICAL HISTORY Past Medical History:  Diagnosis Date   GERD (gastroesophageal reflux disease)    Gout    HSV-1 (herpes simplex virus 1) infection    HSV-2 (herpes simplex virus 2) infection    Hyperlipidemia    Hypertension    Hypogonadism male    Obese    Pneumonia    walking -many years ago   Pre-diabetes    Past Surgical History:  Procedure Laterality Date   BACK SURGERY  2012   on back-hernia   GAS INSERTION Right 08/30/2020   Procedure: INSERTION OF GAS - C3F8;  Surgeon: Bernarda Caffey, MD;  Location: [place];  Service: Ophthalmology;  Laterality: Right;   GAS/FLUID EXCHANGE Right 08/30/2020   Procedure: GAS/FLUID EXCHANGE;  Surgeon: Bernarda Caffey, MD;  Location: [facility];  Service: Ophthalmology;  Laterality: Right;   HERNIA REPAIR     INSERTION OF MESH N/A 08/15/2013   Procedure: INSERTION OF MESH;  Surgeon: Odis Hollingshead, MD;  Location: [facility];  Service: General;  Laterality: N/A;   LAPAROSCOPIC INGUINAL HERNIA WITH UMBILICAL HERNIA Right 1/69/6789   Procedure: LAPAROSCOPIC RIGH  INGUINAL HERNIA WITH OPEN UMBILICAL HERNIA;  Surgeon: Odis Hollingshead, MD;  Location: [facility];  Service: General;  Laterality: Right;  umbilical repair with mesh   PERFLUORONE INJECTION Right 08/30/2020   Procedure: PERFLUORONE INJECTION;  Surgeon: Bernarda Caffey, MD;  Location: [facility] [facility];  Service: Ophthalmology;  Laterality: Right;   PHOTOCOAGULATION WITH LASER Right 08/30/2020   Procedure: PHOTOCOAGULATION WITH LASER;  Surgeon: Bernarda Caffey, MD;  Location: [facility];  Service: Ophthalmology;  Laterality: Right;   VITRECTOMY 25 GAUGE WITH SCLERAL BUCKLE Right 08/30/2020   Procedure: SCLERAL BUCKLE WITH 25 GAUGE PARS PLANA VITRECTOMY;  Surgeon: Bernarda Caffey, MD;  Location: [place];  Service: Ophthalmology;  Laterality: Right;    FAMILY HISTORY Family History  Problem Relation Age of Onset   Diabetes Mother    Hypertension Mother    Hypertension Brother    Hypertension Brother     Hypertension Brother    Colon cancer Brother 13   Colon polyps Neg Hx    Esophageal cancer Neg Hx    Rectal cancer Neg Hx    Stomach cancer Neg Hx     SOCIAL HISTORY Social History   Tobacco Use   Smoking status: Former    Types: Cigarettes    Quit date: 02/23/2002    Years since quitting: 18.7   Smokeless tobacco: Never  Vaping Use   Vaping Use: Never used  Substance Use Topics   Alcohol use: No   Drug use: No         OPHTHALMIC EXAM:  Base Eye Exam  Visual Acuity (Snellen - Linear)       Right Left   Dist [place] CF at 3' 20/25   Dist ph [place] 20/60 -2 20/20 -2         Tonometry (Tonopen, 1:00 PM)       Right Left   Pressure 17 16         Pupils       Dark Light Shape React APD   Right 5 5 Round Minimal None   Left 3 2 Round Minimal None         Visual Fields (Counting fingers)       Left Right    Full Full         Extraocular Movement       Right Left    Full, Ortho Full, Ortho         Neuro/Psych     Oriented x3: Yes   Mood/Affect: Normal         Dilation     Both eyes: 1.0% Mydriacyl, 2.5% Phenylephrine @ 1:01 PM           Slit Lamp and Fundus Exam     Slit Lamp Exam       Right Left   Lids/Lashes Dermatochalasis - upper lid Dermatochalasis - upper lid   Conjunctiva/Sclera melanosis; subconj heme improved melanosis   Cornea arcus, , 1+ fine Punctate epithelial erosions, fine endopigment inferiorly arcus, 2+ fine PEE   Anterior Chamber deep and clear; narrow angles deep and clear   Iris round and dilated, no NVI round and dilated, no NVI   Lens 3+ NS, 3+CS; trace PSC 2+ NS, 2+CS   Vitreous post vitrectomy; gas bubble gone syneresis, +pigment         Fundus Exam       Right Left   Disc pink and sharp pink and sharp   C/D Ratio 0.1 0.2   Macula Flat/re-attached,blunted foveal reflex, mild RPE mottling and clumping, no edema, [place], focal IRH along ST arcades flat, good foveal reflex, mild RPE mottling, no heme  or edema   Vessels attenuated, mild av crossing changes attenuated   Periphery retina attached over buckle; good buckle height; good laser over buckle; ORIGINALLY: Bullous detachment from 1030 to 0130, multiple retinal holes and tears at 1100, 1200, and 0115 attached, no heme, no RT/RD on 360 depression            IMAGING AND PROCEDURES  Imaging and Procedures for 11/01/2020  OCT, Retina - OU - Both Eyes       Right Eye Quality was good. Central Foveal Thickness: 267. Progression has been stable. Findings include no IRF, no SRF, normal foveal contour, outer retinal atrophy, myopic contour (Retina re-attached, patchy ellipsoid signal).   Left Eye Quality was good. Central Foveal Thickness: 257. Progression has been stable. Findings include normal foveal contour, no IRF, no SRF.   Notes *Images captured and stored on drive  Diagnosis / Impression:  OD: Retina re-attached, patchy ellipsoid signal OS: NFP, no IRF/SRF  Clinical management:  See below  Abbreviations: NFP - Normal foveal profile. CME - cystoid macular edema. PED - pigment epithelial detachment. IRF - intraretinal fluid. SRF - subretinal fluid. EZ - ellipsoid zone. ERM - epiretinal membrane. ORA - outer retinal atrophy. ORT - outer retinal tubulation. SRHM - subretinal hyper-reflective material. IRHM - intraretinal hyper-reflective material             ASSESSMENT/PLAN:    ICD-10-CM   1.  Right retinal detachment  H33.21     2. Essential hypertension  I10     3. Hypertensive retinopathy of both eyes  H35.033     4. Combined forms of age-related cataract of both eyes  H25.813     5. Retinal edema  H35.81 OCT, Retina - OU - Both Eyes      1 Rhegmatogenous retinal detachment with multiple holes/tears OD - bullous superior mac off detachment, onset of foveal involvement, 08/29/20 by history - detached from 1030 to 0130, with multiple retinal holes/tears at 1100,1200, and 0115--posterior extension of SRF  through fovea/macula - s/p #41 SBP + 25g PPV/PFO/EL/C3F8 OD, 07.08.22 - doing well, BCVA 20/60-2 today (09.09.22) - retina attached and in good position -- good buckle height and laser around breaks - gas bubble gone             - IOP 17             - decrease PF to Qdaily OD for 1 week, then stop              - PSO ung prn OD              - pt reports difficulty with vision -- will extend work release to Sept. 26 - f/u 4 weeks, DFE, OCT  2,3. Hypertensive retinopathy OU - discussed importance of tight BP control - monitor   4. Age related cataracts OU - The symptoms of cataract, surgical options, and treatments and risks were discussed with patient. - discussed progression of cataract OD post-vitrectomy - will send back to [facility] for cataract [facility] - clear from a retina standpoint to proceed with cataract surgery when pt and surgeon are ready   Ophthalmic Meds Ordered this visit:  No orders of the defined types were placed in this encounter.     Return in about 4 weeks (around 11/29/2020) for f/u RD OD, DFE, OCT.  There are no Patient Instructions on file for this visit.  This document serves as a record of services personally performed by Gardiner Sleeper, MD, PhD. It was created on their behalf by San Jetty. Owens Shark, OA an ophthalmic technician. The creation of this record is the provider's dictation and/or activities during the visit.    Electronically signed by: San Jetty. Owens Shark, [place] 09.06.2022 12:56 AM   Gardiner Sleeper, M.D., Ph.D. Diseases & Surgery of the Retina and Vitreous [facility]  I have reviewed the above documentation for accuracy and completeness, and I agree with the above. Gardiner Sleeper, M.D., Ph.D. 11/02/20 12:56 AM   Abbreviations: M myopia (nearsighted); A astigmatism; H hyperopia (farsighted); P presbyopia; Mrx spectacle prescription;  CTL contact lenses; OD right eye; OS left eye; OU both eyes  XT exotropia; ET  esotropia; PEK punctate epithelial keratitis; PEE punctate epithelial erosions; DES dry eye syndrome; MGD meibomian gland dysfunction; ATs artificial tears; PFAT's preservative free artificial tears; [place] nuclear sclerotic cataract; PSC posterior subcapsular cataract; ERM epi-retinal membrane; PVD posterior vitreous detachment; RD retinal detachment; DM diabetes mellitus; DR diabetic retinopathy; NPDR non-proliferative diabetic retinopathy; PDR proliferative diabetic retinopathy; CSME clinically significant macular edema; DME diabetic macular edema; dbh dot blot hemorrhages; CWS cotton wool spot; POAG primary open angle glaucoma; C/D cup-to-disc ratio; HVF humphrey visual field; GVF goldmann visual field; OCT optical coherence tomography; IOP intraocular pressure; BRVO Branch retinal vein occlusion; CRVO central retinal vein occlusion; CRAO central retinal artery occlusion; BRAO branch retinal artery occlusion;  RT retinal tear; SB scleral buckle; PPV pars plana vitrectomy; VH Vitreous hemorrhage; PRP panretinal laser photocoagulation; IVK intravitreal kenalog; VMT vitreomacular traction; MH Macular hole;  NVD neovascularization of the disc; NVE neovascularization elsewhere; AREDS age related eye disease study; ARMD age related macular degeneration; POAG primary open angle glaucoma; EBMD epithelial/anterior basement membrane dystrophy; ACIOL anterior chamber intraocular lens; IOL intraocular lens; PCIOL posterior chamber intraocular lens; Phaco/IOL phacoemulsification with intraocular lens placement; [place] photorefractive keratectomy; LASIK laser assisted in situ keratomileusis; HTN hypertension; DM diabetes mellitus; COPD chronic obstructive pulmonary disease

## 2020-11-01 ENCOUNTER — Ambulatory Visit (INDEPENDENT_AMBULATORY_CARE_PROVIDER_SITE_OTHER): Payer: BC Managed Care – PPO | Admitting: Ophthalmology

## 2020-11-01 ENCOUNTER — Encounter (INDEPENDENT_AMBULATORY_CARE_PROVIDER_SITE_OTHER): Payer: Self-pay | Admitting: Ophthalmology

## 2020-11-01 ENCOUNTER — Other Ambulatory Visit: Payer: Self-pay

## 2020-11-01 DIAGNOSIS — H25813 Combined forms of age-related cataract, bilateral: Secondary | ICD-10-CM

## 2020-11-01 DIAGNOSIS — H35033 Hypertensive retinopathy, bilateral: Secondary | ICD-10-CM | POA: Diagnosis not present

## 2020-11-01 DIAGNOSIS — H3581 Retinal edema: Secondary | ICD-10-CM | POA: Diagnosis not present

## 2020-11-01 DIAGNOSIS — H3321 Serous retinal detachment, right eye: Secondary | ICD-10-CM

## 2020-11-01 DIAGNOSIS — I10 Essential (primary) hypertension: Secondary | ICD-10-CM | POA: Diagnosis not present

## 2020-11-02 ENCOUNTER — Encounter (INDEPENDENT_AMBULATORY_CARE_PROVIDER_SITE_OTHER): Payer: Self-pay | Admitting: Ophthalmology

## 2020-11-11 ENCOUNTER — Encounter: Payer: Self-pay | Admitting: Internal Medicine

## 2020-11-11 NOTE — Progress Notes (Signed)
Future Appointments  Date Time Provider [place]  11/12/2020 10:30 AM Unk Pinto, MD [facility] None  11/29/2020  2:30 PM Bernarda Caffey, MD [facility] None  05/05/2021    -   CPE  2:00 PM Magda Bernheim, NP [facility] None    History of Present Illness:       This very nice 65 y.o. DBM presents for 6  month follow up with HTN, HLD, Pre-Diabetes and Vitamin D Deficiency. Patient also has hx/o Gout controlled on his meds. In July , patient underwent Scleral Buckling procedure for a Rt  Retinal Detachment. Patient has GERD controlled on his meds.        Patient is treated for HTN  (2004) & BP has been controlled at home. Today's BP is at goal - 138/80. Patient has had no complaints of any cardiac type chest pain, palpitations, dyspnea / orthopnea / PND, dizziness, claudication, or dependent edema.       Hyperlipidemia is controlled with diet & Pravastatin. Patient denies myalgias or other med SE's. Last Lipids were at goal except elevated Trig's:  Lab Results  Component Value Date   CHOL 159 05/02/2020   HDL 35 (L) 05/02/2020   LDLCALC 95 05/02/2020   TRIG 194 (H) 05/02/2020   CHOLHDL 4.5 05/02/2020     Also, the patient has history of PreDiabetes (A1c 6.4% /2009 & A1c 5.9% /2016) and has had no symptoms of reactive hypoglycemia, diabetic polys, paresthesias or visual blurring.  Last A1c was normal & at goal:  Lab Results  Component Value Date   HGBA1C 5.6 05/02/2020        Further, the patient also has history of Vitamin D Deficiency ("21" /2008) and supplements vitamin D without any suspected side-effects. Last vitamin D was sl low  (goal 70-100):  Lab Results  Component Value Date   VD25OH 51 05/02/2020     Current Outpatient Medications on File Prior to Visit  Medication Sig   acyclovir (ZOVIRAX) 800 MG tablet Take 1 tablet  Daily  to Prevent Fever Blisters & Cold Sores  /  Patient knows to take by mouth   allopurinol (ZYLOPRIM) 300 MG tablet Take 1 tablet  Daily to Prevent Gout   alum & mag hydroxide-simeth (MAALOX/MYLANTA) 200-200-20 MG/5ML suspension Take 30 mLs by mouth 2 (two) times daily.   aspirin EC 81 MG tablet Take 81 mg by mouth daily.   atenolol (TENORMIN) 100 MG tablet Take 1 tablet  Daily for BP /  Patient knows to take by mouth   B Complex-C (SUPER B COMPLEX PO) Take 1 tablet by mouth daily.   bacitracin-polymyxin b (POLYSPORIN) ophthalmic ointment Place into the right eye at bedtime. Place a 1/2 inch ribbon of ointment into the lower eyelid.   Cholecalciferol (VITAMIN D) 2000 UNITS CAPS Take 4,000 Units by mouth daily.   cyclobenzaprine (FLEXERIL) 10 MG tablet Take 10 mg by mouth at bedtime as needed.   famotidine (PEPCID) 40 MG tablet Take 1 tablet (40 mg total) by mouth every evening.   Flaxseed, Linseed, (FLAX SEED OIL) 1000 MG CAPS Take 1,000 mg by mouth daily.   fluticasone (FLONASE) 50 MCG/ACT nasal spray Place 2 sprays into both nostrils daily.   gabapentin (NEURONTIN) 300 MG capsule Take 1 capsule (300 mg total) by mouth 2 (two) times daily as needed.   hydrochlorothiazide (HYDRODIURIL) 25 MG tablet Take  1 tablet  Daily  for BP & Fluid Retention /Ankle Swelling - Patient knows to take by  mouth   loratadine (EQ LORATADINE) 10 MG tablet TAKE 1 TABLET BY MOUTH DAILY FOR ALLERGIES   Omega-3 Fatty Acids (FISH OIL) 1000 MG CAPS Take 1,000 mg by mouth daily.   pravastatin (PRAVACHOL) 20 MG tablet Take     1 tablet       at Bedtime        for Cholesterol   prednisoLONE acetate (PRED FORTE) 1 % ophthalmic suspension Place 1 drop into the right eye 4 (four) times daily.   sildenafil (VIAGRA) 100 MG tablet Take 1/2 to 1 tablet /day  ONLY if needed for XXXX   No current facility-administered medications on file prior to visit.     Allergies  Allergen Reactions   Ace Inhibitors     REACTION: cough   Trilipix [Choline Fenofibrate] Other (See Comments)    Heart burn     PMHx:   Past Medical History:  Diagnosis Date   GERD  (gastroesophageal reflux disease)    Gout    HSV-1 (herpes simplex virus 1) infection    HSV-2 (herpes simplex virus 2) infection    Hyperlipidemia    Hypertension    Hypogonadism male    Obese    Pneumonia    walking -many years ago   Pre-diabetes      Immunization History  Administered Date(s) Administered   PPD Test 02/13/2014   Pneumococcal Polysaccharide-23 02/23/2002   Tdap 05/15/2009, 05/02/2020     Past Surgical History:  Procedure Laterality Date   BACK SURGERY  2012   on back-hernia   GAS INSERTION Right 08/30/2020   Procedure: INSERTION OF GAS - C3F8;  Surgeon: Bernarda Caffey, MD;  Location: [facility];  Service: Ophthalmology;  Laterality: Right;   GAS/FLUID EXCHANGE Right 08/30/2020   Procedure: GAS/FLUID EXCHANGE;  Surgeon: Bernarda Caffey, MD;  Location: [facility];  Service: Ophthalmology;  Laterality: Right;   HERNIA REPAIR     INSERTION OF MESH N/A 08/15/2013   Procedure: INSERTION OF MESH;  Surgeon: Odis Hollingshead, MD;  [facility];  Service: General;  Laterality: N/A;   LAPAROSCOPIC INGUINAL HERNIA WITH UMBILICAL HERNIA Right 10/24/5174   Procedure: LAPAROSCOPIC RIGH  INGUINAL HERNIA WITH OPEN UMBILICAL HERNIA;  Surgeon: Odis Hollingshead, MD;  [facility];  Service: General;  Laterality: Right;  umbilical repair with mesh   PERFLUORONE INJECTION Right 08/30/2020   Procedure: PERFLUORONE INJECTION;  Surgeon: Bernarda Caffey, MD;  Location: [place];  Service: Ophthalmology;  Laterality: Right;   PHOTOCOAGULATION WITH LASER Right 08/30/2020   Procedure: PHOTOCOAGULATION WITH LASER;  Surgeon: Bernarda Caffey, MD;  Location: [facility];  Service: Ophthalmology;  Laterality: Right;   VITRECTOMY 25 GAUGE WITH SCLERAL BUCKLE Right 08/30/2020   Procedure: SCLERAL BUCKLE WITH 25 GAUGE PARS PLANA VITRECTOMY;  Surgeon: Bernarda Caffey, MD;  Location: [place];  Service: Ophthalmology;  Laterality: Right;    FHx:    Reviewed / unchanged  SHx:    Reviewed / unchanged   Systems  Review:  Constitutional: Denies fever, chills, wt changes, headaches, insomnia, fatigue, night sweats, change in appetite. Eyes: Denies redness, blurred vision, diplopia, discharge, itchy, watery eyes.  ENT: Denies discharge, congestion, post nasal drip, epistaxis, sore throat, earache, hearing loss, dental pain, tinnitus, vertigo, sinus pain, snoring.  CV: Denies chest pain, palpitations, irregular heartbeat, syncope, dyspnea, diaphoresis, orthopnea, PND, claudication or edema. Respiratory: denies cough, dyspnea, DOE, pleurisy, hoarseness, laryngitis, wheezing.  Gastrointestinal: Denies dysphagia, odynophagia, heartburn, reflux, water brash, abdominal pain or cramps, nausea, vomiting, bloating, diarrhea,  constipation, hematemesis, melena, hematochezia  or hemorrhoids. Genitourinary: Denies dysuria, frequency, urgency, nocturia, hesitancy, discharge, hematuria or flank pain. Musculoskeletal: Denies arthralgias, myalgias, stiffness, jt. swelling, pain, limping or strain/sprain.  Skin: Denies pruritus, rash, hives, warts, acne, eczema or change in skin lesion(s). Neuro: No weakness, tremor, incoordination, spasms, paresthesia or pain. Psychiatric: Denies confusion, memory loss or sensory loss. Endo: Denies change in weight, skin or hair change.  Heme/Lymph: No excessive bleeding, bruising or enlarged lymph nodes.  Physical Exam  BP 138/80   Pulse 69   Temp 97.6 F (36.4 C)   Resp 17   Ht 5\' 8"  (1.727 m)   Wt 184 lb 9.6 oz (83.7 kg)   SpO2 97%   BMI 28.07 kg/m   Appears  well nourished, well groomed  and in no distress.  Eyes: PERRLA, EOMs, conjunctiva no swelling or erythema. Sinuses: No frontal/maxillary tenderness ENT/Mouth: EAC's clear, TM's nl w/o erythema, bulging. Nares clear w/o erythema, swelling, exudates. Oropharynx clear without erythema or exudates. Oral hygiene is good. Tongue normal, non obstructing. Hearing intact.  Neck: Supple. Thyroid not palpable. Car 2+/2+ without  bruits, nodes or JVD. Chest: Respirations nl with BS clear & equal w/o rales, rhonchi, wheezing or stridor.  Cor: Heart sounds normal w/ regular rate and rhythm without sig. murmurs, gallops, clicks or rubs. Peripheral pulses normal and equal  without edema.  Abdomen: Soft & bowel sounds normal. Non-tender w/o guarding, rebound, hernias, masses or organomegaly.  Lymphatics: Unremarkable.  Musculoskeletal: Full ROM all peripheral extremities, joint stability, 5/5 strength and normal gait.  Skin: Warm, dry without exposed rashes, lesions or ecchymosis apparent.  Neuro: Cranial nerves intact, reflexes equal bilaterally. Sensory-motor testing grossly intact. Tendon reflexes grossly intact.  Pysch: Alert & oriented x 3.  Insight and judgement nl & appropriate. No ideations.  Assessment and Plan:                                          1. Essential hypertension  - Continue medication, monitor blood pressure at home.  - Continue DASH diet.  Reminder to go to the ER if any CP,  SOB, nausea, dizziness, severe HA, changes vision/speech.      - CBC with Differential/Platelet - COMPLETE METABOLIC PANEL WITH GFR - Magnesium - TSH  2. Hyperlipidemia, mixed  - Continue diet/meds, exercise,& lifestyle modifications.  - Continue monitor periodic cholesterol/liver & renal functions     - Lipid panel - TSH  3. Abnormal glucose  - Continue diet, exercise  - Lifestyle modifications.  - Monitor appropriate labs   - Hemoglobin A1c - Insulin, random  4. Vitamin D deficiency  - Continue supplementation.    - VITAMIN D 25 Hydroxy   5. Gastroesophageal reflux disease  - CBC with Differential/Platelet  6. Idiopathic gout  - Uric acid  7. Medication management   - CBC with Differential/Platelet - COMPLETE METABOLIC PANEL WITH GFR - Magnesium - Lipid panel - TSH - Hemoglobin A1c - Insulin, random - VITAMIN D 25 Hydroxy  - Uric acid        Discussed  regular exercise, BP  monitoring, weight control to achieve/maintain BMI less than 25 and discussed med and SE's. Recommended labs to assess and monitor clinical status with further disposition pending results of labs.  I discussed the assessment and treatment plan with the patient. The patient was provided an opportunity to ask questions  and all were answered. The patient agreed with the plan and demonstrated an understanding of the instructions.  I provided over 30 minutes of exam, counseling, chart review and  complex critical decision making.        The patient was advised to call back or seek an in-person evaluation if the symptoms worsen or if the condition fails to improve as anticipated.   Kirtland Bouchard, MD

## 2020-11-11 NOTE — Patient Instructions (Signed)

## 2020-11-12 ENCOUNTER — Other Ambulatory Visit: Payer: Self-pay

## 2020-11-12 ENCOUNTER — Ambulatory Visit (INDEPENDENT_AMBULATORY_CARE_PROVIDER_SITE_OTHER): Payer: BC Managed Care – PPO | Admitting: Internal Medicine

## 2020-11-12 VITALS — BP 138/80 | HR 69 | Temp 97.6°F | Resp 17 | Ht 68.0 in | Wt 184.6 lb

## 2020-11-12 DIAGNOSIS — E559 Vitamin D deficiency, unspecified: Secondary | ICD-10-CM

## 2020-11-12 DIAGNOSIS — Z79899 Other long term (current) drug therapy: Secondary | ICD-10-CM

## 2020-11-12 DIAGNOSIS — M1000 Idiopathic gout, unspecified site: Secondary | ICD-10-CM | POA: Diagnosis not present

## 2020-11-12 DIAGNOSIS — E782 Mixed hyperlipidemia: Secondary | ICD-10-CM | POA: Diagnosis not present

## 2020-11-12 DIAGNOSIS — I10 Essential (primary) hypertension: Secondary | ICD-10-CM | POA: Diagnosis not present

## 2020-11-12 DIAGNOSIS — R7309 Other abnormal glucose: Secondary | ICD-10-CM | POA: Diagnosis not present

## 2020-11-12 DIAGNOSIS — K219 Gastro-esophageal reflux disease without esophagitis: Secondary | ICD-10-CM

## 2020-11-13 LAB — COMPLETE METABOLIC PANEL WITH GFR
AG Ratio: 2 (calc) (ref 1.0–2.5)
ALT: 12 U/L (ref 9–46)
AST: 19 U/L (ref 10–35)
Albumin: 4.5 g/dL (ref 3.6–5.1)
Alkaline phosphatase (APISO): 81 U/L (ref 35–144)
BUN: 16 mg/dL (ref 7–25)
CO2: 28 mmol/L (ref 20–32)
Calcium: 9.9 mg/dL (ref 8.6–10.3)
Chloride: 104 mmol/L (ref 98–110)
Creat: 0.92 mg/dL (ref 0.70–1.35)
Globulin: 2.2 g/dL (calc) (ref 1.9–3.7)
Glucose, Bld: 106 mg/dL — ABNORMAL HIGH (ref 65–99)
Potassium: 3.5 mmol/L (ref 3.5–5.3)
Sodium: 141 mmol/L (ref 135–146)
Total Bilirubin: 0.5 mg/dL (ref 0.2–1.2)
Total Protein: 6.7 g/dL (ref 6.1–8.1)
eGFR: 93 mL/min/{1.73_m2} (ref >=60)

## 2020-11-13 LAB — TSH: TSH: 1.03 mIU/L (ref 0.40–4.50)

## 2020-11-13 LAB — CBC WITH DIFFERENTIAL/PLATELET
Absolute Monocytes: 509 cells/uL (ref 200–950)
Basophils Absolute: 38 cells/uL (ref 0–200)
Basophils Relative: 0.8 %
Eosinophils Absolute: 182 cells/uL (ref 15–500)
Eosinophils Relative: 3.8 %
HCT: 44.9 % (ref 38.5–50.0)
Hemoglobin: 14.9 g/dL (ref 13.2–17.1)
Lymphs Abs: 2035 cells/uL (ref 850–3900)
MCH: 31.2 pg (ref 27.0–33.0)
MCHC: 33.2 g/dL (ref 32.0–36.0)
MCV: 93.9 fL (ref 80.0–100.0)
MPV: 11.4 fL (ref 7.5–12.5)
Monocytes Relative: 10.6 %
Neutro Abs: 2035 cells/uL (ref 1500–7800)
Neutrophils Relative %: 42.4 %
Platelets: 145 10*3/uL (ref 140–400)
RBC: 4.78 10*6/uL (ref 4.20–5.80)
RDW: 13.5 % (ref 11.0–15.0)
Total Lymphocyte: 42.4 %
WBC: 4.8 10*3/uL (ref 3.8–10.8)

## 2020-11-13 LAB — LIPID PANEL
Cholesterol: 164 mg/dL (ref <200)
HDL: 43 mg/dL (ref >=40)
LDL Cholesterol (Calc): 99 mg/dL (calc)
Non-HDL Cholesterol (Calc): 121 mg/dL (calc) (ref <130)
Total CHOL/HDL Ratio: 3.8 (calc) (ref <5.0)
Triglycerides: 122 mg/dL (ref <150)

## 2020-11-13 LAB — HEMOGLOBIN A1C
Hgb A1c MFr Bld: 5.5 % of total Hgb (ref <5.7)
Mean Plasma Glucose: 111 mg/dL
eAG (mmol/L): 6.2 mmol/L

## 2020-11-13 LAB — VITAMIN D 25 HYDROXY (VIT D DEFICIENCY, FRACTURES): Vit D, 25-Hydroxy: 42 ng/mL (ref 30–100)

## 2020-11-13 LAB — INSULIN, RANDOM: Insulin: 40.3 u[IU]/mL — ABNORMAL HIGH

## 2020-11-13 LAB — MAGNESIUM: Magnesium: 2.2 mg/dL (ref 1.5–2.5)

## 2020-11-13 LAB — URIC ACID: Uric Acid, Serum: 5.3 mg/dL (ref 4.0–8.0)

## 2020-11-13 NOTE — Progress Notes (Signed)
============================================================ -   Test results slightly outside the reference range are not unusual. If there is anything important, I will review this with you,  otherwise it is considered normal test values.  If you have further questions,  please do not hesitate to contact me at the office or via My Chart.  ============================================================ ============================================================  -  Total Chol = 164     &   LDL Chol = 99 - Both Great ! ============================================================ ============================================================  -  A1c - Normal -   No Diabetes - Great ! ============================================================ ============================================================  -  Vitamin D = 42 - Low --- Ideal is between 70 to 100, So Recc increase Vit D to   - Vit D 5,000 unit  capsules & take 2 capsules  = 10,000 units  /day  ============================================================ ============================================================  -  All Else - CBC - Kidneys - Electrolytes - Liver - Magnesium & Thyroid    - all  Normal / OK ============================================================ ============================================================

## 2020-11-19 DIAGNOSIS — H25811 Combined forms of age-related cataract, right eye: Secondary | ICD-10-CM | POA: Diagnosis not present

## 2020-11-19 DIAGNOSIS — H2512 Age-related nuclear cataract, left eye: Secondary | ICD-10-CM | POA: Diagnosis not present

## 2020-11-19 DIAGNOSIS — H04123 Dry eye syndrome of bilateral lacrimal glands: Secondary | ICD-10-CM | POA: Diagnosis not present

## 2020-11-19 DIAGNOSIS — H35033 Hypertensive retinopathy, bilateral: Secondary | ICD-10-CM | POA: Diagnosis not present

## 2020-11-19 DIAGNOSIS — H33021 Retinal detachment with multiple breaks, right eye: Secondary | ICD-10-CM | POA: Diagnosis not present

## 2020-11-28 NOTE — Progress Notes (Signed)
[facility] Note  11/29/2020     CHIEF COMPLAINT Patient presents for Retina Follow Up   HISTORY OF PRESENT ILLNESS: Caleb Johns is a 65 y.o. male who presents to the clinic today for:   HPI     Retina Follow Up   Patient presents with  Retinal Break/Detachment.  In right eye.  This started weeks ago.  Severity is moderate.  Duration of weeks.  Since onset it is stable.  I, the attending physician,  performed the HPI with the patient and updated documentation appropriately.        Comments   Pt states right eye is still very blurry--patient states "I need to get this cataract off".  Pt denies eye pain or discomfort and denies any new or worsening floaters or fol OU.      Last edited by Bernarda Caffey, MD on 12/01/2020 12:34 PM.    Patient states he has cataract sx scheduled right before Christmas with Dr. Katy Fitch, he feels like his vision has decreased since his last visit here  Referring physician: Shirleen Schirmer PA [address] [place], [place] [address]  HISTORICAL INFORMATION:   Selected notes from the [facility] Referred by Shirleen Schirmer, PA for RD OD LEE: 08/29/2020 Ocular Hx- RD OD BCVA OD CF OS 20/25 PMH- gout, HTN   CURRENT MEDICATIONS: Current Outpatient Medications (Ophthalmic Drugs)  Medication Sig   bacitracin-polymyxin b (POLYSPORIN) ophthalmic ointment Place into the right eye at bedtime. Place a 1/2 inch ribbon of ointment into the lower eyelid. (Patient not taking: Reported on 11/12/2020)   prednisoLONE acetate (PRED FORTE) 1 % ophthalmic suspension Place 1 drop into the right eye 4 (four) times daily.   No current facility-administered medications for this visit. (Ophthalmic Drugs)   Current Outpatient Medications (Other)  Medication Sig   acyclovir (ZOVIRAX) 800 MG tablet Take 1 tablet  Daily  to Prevent Fever Blisters & Cold Sores  /  Patient knows to take by mouth (Patient not taking: Reported on  11/12/2020)   allopurinol (ZYLOPRIM) 300 MG tablet Take 1 tablet Daily to Prevent Gout   alum & mag hydroxide-simeth (MAALOX/MYLANTA) 200-200-20 MG/5ML suspension Take 30 mLs by mouth 2 (two) times daily.   aspirin EC 81 MG tablet Take 81 mg by mouth daily.   atenolol (TENORMIN) 100 MG tablet Take 1 tablet  Daily for BP /  Patient knows to take by mouth   B Complex-C (SUPER B COMPLEX PO) Take 1 tablet by mouth daily.   Cholecalciferol (VITAMIN D) 2000 UNITS CAPS Take 4,000 Units by mouth daily.   cyclobenzaprine (FLEXERIL) 10 MG tablet Take 10 mg by mouth at bedtime as needed.   famotidine (PEPCID) 40 MG tablet Take 1 tablet (40 mg total) by mouth every evening.   Flaxseed, Linseed, (FLAX SEED OIL) 1000 MG CAPS Take 1,000 mg by mouth daily.   fluticasone (FLONASE) 50 MCG/ACT nasal spray Place 2 sprays into both nostrils daily.   gabapentin (NEURONTIN) 300 MG capsule Take 1 capsule (300 mg total) by mouth 2 (two) times daily as needed.   hydrochlorothiazide (HYDRODIURIL) 25 MG tablet Take  1 tablet  Daily  for BP & Fluid Retention /Ankle Swelling - Patient knows to take by mouth   loratadine (EQ LORATADINE) 10 MG tablet TAKE 1 TABLET BY MOUTH DAILY FOR ALLERGIES   Omega-3 Fatty Acids (FISH OIL) 1000 MG CAPS Take 1,000 mg by mouth daily.   pravastatin (  PRAVACHOL) 20 MG tablet Take     1 tablet       at Bedtime        for Cholesterol   sildenafil (VIAGRA) 100 MG tablet Take 1/2 to 1 tablet /day  ONLY if needed for XXXX   No current facility-administered medications for this visit. (Other)   REVIEW OF SYSTEMS: ROS   Positive for: Eyes, Respiratory Negative for: Constitutional, Gastrointestinal, Neurological, Skin, Genitourinary, Musculoskeletal, HENT, Endocrine, Cardiovascular, Psychiatric, Allergic/Imm, Heme/Lymph Last edited by Doneen Poisson on 11/29/2020  2:43 PM.     ALLERGIES Allergies  Allergen Reactions   Ace Inhibitors     REACTION: cough   Trilipix [Choline Fenofibrate] Other  (See Comments)    Heart burn   PAST MEDICAL HISTORY Past Medical History:  Diagnosis Date   GERD (gastroesophageal reflux disease)    Gout    HSV-1 (herpes simplex virus 1) infection    HSV-2 (herpes simplex virus 2) infection    Hyperlipidemia    Hypertension    Hypogonadism male    Obese    Pneumonia    walking -many years ago   Pre-diabetes    Past Surgical History:  Procedure Laterality Date   BACK SURGERY  2012   on back-hernia   GAS INSERTION Right 08/30/2020   Procedure: INSERTION OF GAS - C3F8;  Surgeon: Bernarda Caffey, MD;  Location: [facility];  Service: Ophthalmology;  Laterality: Right;   GAS/FLUID EXCHANGE Right 08/30/2020   Procedure: GAS/FLUID EXCHANGE;  Surgeon: Bernarda Caffey, MD;  Location: [facility];  Service: Ophthalmology;  Laterality: Right;   HERNIA REPAIR     INSERTION OF MESH N/A 08/15/2013   Procedure: INSERTION OF MESH;  Surgeon: Odis Hollingshead, MD;  Location: [facility];  Service: General;  Laterality: N/A;   LAPAROSCOPIC INGUINAL HERNIA WITH UMBILICAL HERNIA Right 0/62/6948   Procedure: LAPAROSCOPIC RIGH  INGUINAL HERNIA WITH OPEN UMBILICAL HERNIA;  Surgeon: Odis Hollingshead, MD;  Location: [facility];  Service: General;  Laterality: Right;  umbilical repair with mesh   PERFLUORONE INJECTION Right 08/30/2020   Procedure: PERFLUORONE INJECTION;  Surgeon: Bernarda Caffey, MD;  Location: [facility];  Service: Ophthalmology;  Laterality: Right;   PHOTOCOAGULATION WITH LASER Right 08/30/2020   Procedure: PHOTOCOAGULATION WITH LASER;  Surgeon: Bernarda Caffey, MD;  Location: [facility];  Service: Ophthalmology;  Laterality: Right;   VITRECTOMY 25 GAUGE WITH SCLERAL BUCKLE Right 08/30/2020   Procedure: SCLERAL BUCKLE WITH 25 GAUGE PARS PLANA VITRECTOMY;  Surgeon: Bernarda Caffey, MD;  Location: [facility];  Service: Ophthalmology;  Laterality: Right;    FAMILY HISTORY Family History  Problem Relation Age of Onset   Diabetes Mother    Hypertension Mother    Hypertension Brother    Hypertension  Brother    Hypertension Brother    Colon cancer Brother 80   Colon polyps Neg Hx    Esophageal cancer Neg Hx    Rectal cancer Neg Hx    Stomach cancer Neg Hx     SOCIAL HISTORY Social History   Tobacco Use   Smoking status: Former    Types: Cigarettes    Quit date: 02/23/2002    Years since quitting: 18.7   Smokeless tobacco: Never  Vaping Use   Vaping Use: Never used  Substance Use Topics   Alcohol use: No   Drug use: No       OPHTHALMIC EXAM: Base Eye Exam     Visual Acuity (Snellen - Linear)  Right Left   Dist [place] CF @ 5' 20/20 -2   Dist ph [place] 20/70 -2          Tonometry (Tonopen, 2:42 PM)       Right Left   Pressure 16 14         Pupils       Dark Light Shape React APD   Right 4 3 Round Brisk 0   Left 3 2 Round Brisk 0         Visual Fields       Left Right    Full Full         Extraocular Movement       Right Left    Full Full         Neuro/Psych     Oriented x3: Yes   Mood/Affect: Normal         Dilation     Both eyes: 1.0% Mydriacyl, 2.5% Phenylephrine @ 2:42 PM           Slit Lamp and Fundus Exam     Slit Lamp Exam       Right Left   Lids/Lashes Dermatochalasis - upper lid Dermatochalasis - upper lid   Conjunctiva/Sclera melanosis, sutures dissolved melanosis   Cornea arcus, , 1+ fine Punctate epithelial erosions, fine endo pigment inferiorly arcus, 2+ fine PEE   Anterior Chamber deep and clear; narrow angles deep and clear   Iris round and dilated, no NVI round and dilated, no NVI   Lens 3+ NS, 3+CS; trace PSC 2+ NS, 2+CS   Vitreous post vitrectomy; gas bubble gone syneresis, +pigment         Fundus Exam       Right Left   Disc pink and sharp pink and sharp   C/D Ratio 0.1 0.2   Macula Flat/re-attached, good foveal reflex, trace ERM, mild RPE mottling and clumping, no edema, [place], focal IRH along ST arcades flat, good foveal reflex, mild RPE mottling, no heme or edema   Vessels attenuated, mild  av crossing changes, mild Copper wiring, mild tortuousity attenuated   Periphery retina attached over buckle; good buckle height; good laser over buckle; ORIGINALLY: Bullous detachment from 1030 to 0130, multiple retinal holes and tears at 1100, 1200, and 0115 attached, no heme, no RT/RD on 360 exam           IMAGING AND PROCEDURES  Imaging and Procedures for 11/29/2020  OCT, Retina - OU - Both Eyes       Right Eye Quality was good. Central Foveal Thickness: 268. Progression has been stable. Findings include no IRF, no SRF, normal foveal contour, outer retinal atrophy, myopic contour (Retina re-attached, patchy ellipsoid signal).   Left Eye Quality was good. Central Foveal Thickness: 259. Progression has been stable. Findings include normal foveal contour, no IRF, no SRF.   Notes *Images captured and stored on drive  Diagnosis / Impression:  OD: Retina re-attached, patchy ellipsoid signal improving OS: NFP, no IRF/SRF  Clinical management:  See below  Abbreviations: NFP - Normal foveal profile. CME - cystoid macular edema. PED - pigment epithelial detachment. IRF - intraretinal fluid. SRF - subretinal fluid. EZ - ellipsoid zone. ERM - epiretinal membrane. ORA - outer retinal atrophy. ORT - outer retinal tubulation. SRHM - subretinal hyper-reflective material. IRHM - intraretinal hyper-reflective material            ASSESSMENT/PLAN:    ICD-10-CM   1. Right retinal detachment  H33.21  2. Essential hypertension  I10     3. Hypertensive retinopathy of both eyes  H35.033     4. Combined forms of age-related cataract of both eyes  H25.813     5. Retinal edema  H35.81 OCT, Retina - OU - Both Eyes    1. Rhegmatogenous retinal detachment with multiple holes/tears OD - bullous superior mac off detachment, onset of foveal involvement, 08/29/20 by history - detached from 1030 to 0130, with multiple retinal holes/tears at 1100,1200, and 0115--posterior extension of SRF  through fovea/macula - s/p #41 SBP + 25g PPV/PFO/EL/C3F8 OD, 07.08.22 - doing well, BCVA 20/70-2 today (08.26.22) -- cataract progression - retina attached and in good position -- good buckle height and laser around breaks             - IOP 16 - f/u 4 months, DFE, OCT  2,3. Hypertensive retinopathy OU - discussed importance of tight BP control - monitor   4. Age related cataracts OU - The symptoms of cataract, surgical options, and treatments and risks were discussed with patient. - surgery scheduled  on 12.23.22 OD with Dr. Katy Fitch  Ophthalmic Meds Ordered this visit:  No orders of the defined types were placed in this encounter.     Return in about 4 months (around 04/01/2021) for f/u RD OD, DFE, OCT.  There are no Patient Instructions on file for this visit.  This document serves as a record of services personally performed by Gardiner Sleeper, MD, PhD. It was created on their behalf by Leonie Douglas, an ophthalmic technician. The creation of this record is the provider's dictation and/or activities during the visit.    Electronically signed by: Leonie Douglas COA, 12/01/20  4:49 PM  This document serves as a record of services personally performed by Gardiner Sleeper, MD, PhD. It was created on their behalf by San Jetty. Owens Shark, OA an ophthalmic technician. The creation of this record is the provider's dictation and/or activities during the visit.    Electronically signed by: San Jetty. Marguerita Merles 10.07.2022 4:49 PM   Gardiner Sleeper, M.D., Ph.D. Diseases & Surgery of the Retina and [place] 11/29/2020  I have reviewed the above documentation for accuracy and completeness, and I agree with the above. Gardiner Sleeper, M.D., Ph.D. 12/01/20 4:49 PM   Abbreviations: M myopia (nearsighted); A astigmatism; H hyperopia (farsighted); P presbyopia; Mrx spectacle prescription;  CTL contact lenses; OD right eye; OS left eye; OU both eyes  XT exotropia; ET  esotropia; PEK punctate epithelial keratitis; PEE punctate epithelial erosions; DES dry eye syndrome; MGD meibomian gland dysfunction; ATs artificial tears; PFAT's preservative free artificial tears; [place] nuclear sclerotic cataract; PSC posterior subcapsular cataract; ERM epi-retinal membrane; PVD posterior vitreous detachment; RD retinal detachment; DM diabetes mellitus; DR diabetic retinopathy; NPDR non-proliferative diabetic retinopathy; PDR proliferative diabetic retinopathy; CSME clinically significant macular edema; DME diabetic macular edema; dbh dot blot hemorrhages; CWS cotton wool spot; POAG primary open angle glaucoma; C/D cup-to-disc ratio; HVF humphrey visual field; GVF goldmann visual field; OCT optical coherence tomography; IOP intraocular pressure; BRVO Branch retinal vein occlusion; CRVO central retinal vein occlusion; CRAO central retinal artery occlusion; BRAO branch retinal artery occlusion; RT retinal tear; SB scleral buckle; PPV pars plana vitrectomy; VH Vitreous hemorrhage; PRP panretinal laser photocoagulation; IVK intravitreal kenalog; VMT vitreomacular traction; MH Macular hole;  NVD neovascularization of the disc; NVE neovascularization elsewhere; AREDS age related eye disease study; ARMD age related macular degeneration; POAG primary open  angle glaucoma; EBMD epithelial/anterior basement membrane dystrophy; ACIOL anterior chamber intraocular lens; IOL intraocular lens; PCIOL posterior chamber intraocular lens; Phaco/IOL phacoemulsification with intraocular lens placement; [place] photorefractive keratectomy; LASIK laser assisted in situ keratomileusis; HTN hypertension; DM diabetes mellitus; COPD chronic obstructive pulmonary disease

## 2020-11-29 ENCOUNTER — Ambulatory Visit (INDEPENDENT_AMBULATORY_CARE_PROVIDER_SITE_OTHER): Payer: BC Managed Care – PPO | Admitting: Ophthalmology

## 2020-11-29 ENCOUNTER — Other Ambulatory Visit: Payer: Self-pay

## 2020-11-29 DIAGNOSIS — H35033 Hypertensive retinopathy, bilateral: Secondary | ICD-10-CM | POA: Diagnosis not present

## 2020-11-29 DIAGNOSIS — I10 Essential (primary) hypertension: Secondary | ICD-10-CM

## 2020-11-29 DIAGNOSIS — H3321 Serous retinal detachment, right eye: Secondary | ICD-10-CM

## 2020-11-29 DIAGNOSIS — H25813 Combined forms of age-related cataract, bilateral: Secondary | ICD-10-CM

## 2020-11-29 DIAGNOSIS — H3581 Retinal edema: Secondary | ICD-10-CM

## 2020-12-01 ENCOUNTER — Encounter (INDEPENDENT_AMBULATORY_CARE_PROVIDER_SITE_OTHER): Payer: Self-pay | Admitting: Ophthalmology

## 2021-02-14 DIAGNOSIS — H25811 Combined forms of age-related cataract, right eye: Secondary | ICD-10-CM | POA: Diagnosis not present

## 2021-03-06 ENCOUNTER — Other Ambulatory Visit: Payer: Self-pay

## 2021-03-06 ENCOUNTER — Encounter: Payer: Self-pay | Admitting: Nurse Practitioner

## 2021-03-06 ENCOUNTER — Ambulatory Visit: Payer: BC Managed Care – PPO | Admitting: Nurse Practitioner

## 2021-03-06 VITALS — BP 140/88 | HR 56 | Temp 97.5°F | Wt 202.2 lb

## 2021-03-06 DIAGNOSIS — R7309 Other abnormal glucose: Secondary | ICD-10-CM | POA: Diagnosis not present

## 2021-03-06 DIAGNOSIS — M1000 Idiopathic gout, unspecified site: Secondary | ICD-10-CM

## 2021-03-06 DIAGNOSIS — E782 Mixed hyperlipidemia: Secondary | ICD-10-CM

## 2021-03-06 DIAGNOSIS — M1A00X0 Idiopathic chronic gout, unspecified site, without tophus (tophi): Secondary | ICD-10-CM

## 2021-03-06 DIAGNOSIS — N401 Enlarged prostate with lower urinary tract symptoms: Secondary | ICD-10-CM | POA: Diagnosis not present

## 2021-03-06 DIAGNOSIS — E559 Vitamin D deficiency, unspecified: Secondary | ICD-10-CM

## 2021-03-06 DIAGNOSIS — K219 Gastro-esophageal reflux disease without esophagitis: Secondary | ICD-10-CM

## 2021-03-06 DIAGNOSIS — R079 Chest pain, unspecified: Secondary | ICD-10-CM | POA: Diagnosis not present

## 2021-03-06 DIAGNOSIS — I10 Essential (primary) hypertension: Secondary | ICD-10-CM | POA: Diagnosis not present

## 2021-03-06 DIAGNOSIS — E663 Overweight: Secondary | ICD-10-CM

## 2021-03-06 MED ORDER — TAMSULOSIN HCL 0.4 MG PO CAPS: 0.4000 mg | ORAL_CAPSULE | Freq: Every day | ORAL | 3 refills | Status: DC

## 2021-03-06 MED ORDER — LOSARTAN POTASSIUM 50 MG PO TABS: 50.0000 mg | ORAL_TABLET | Freq: Every day | ORAL | 11 refills | Status: DC

## 2021-03-06 MED ORDER — ALLOPURINOL 300 MG PO TABS: ORAL_TABLET | ORAL | 1 refills | Status: DC

## 2021-03-06 NOTE — Patient Instructions (Addendum)
Start taking Losartan with 1/2 tab of Atenolol, check BP daily call office if consistently greater  than 130/80. Return to office in 2 weeks for BP check  Go to the ER if any chest pain, shortness of breath, nausea, dizziness, severe HA, changes vision/speech   Losartan Tablets What is this medication? LOSARTAN (loe SAR tan) treats high blood pressure. It may also be used to prevent a stroke in people with heart disease and high blood pressure. It can be used to prevent kidney damage in people with diabetes. It works by relaxing the blood vessels, which helps decrease the amount of work your heart has to do. It belongs to a group of medications called ARBs. This medicine may be used for other purposes; ask your health care provider or pharmacist if you have questions. COMMON BRAND NAME(S): Cozaar What should I tell my care team before I take this medication? They need to know if you have any of these conditions: Heart failure Kidney disease Liver disease An unusual or allergic reaction to losartan, other medications, foods, dyes, or preservatives Pregnant or trying to get pregnant Breast-feeding How should I use this medication? Take this medication by mouth. Take it as directed on the prescription label at the same time every day. You can take it with or without food. If it upsets your stomach, take it with food. Keep taking it unless your care team tells you to stop. Talk to your care team about the use of this medication in children. While it may be prescribed for children as young as 6 for selected conditions, precautions do apply. Overdosage: If you think you have taken too much of this medicine contact a poison control center or emergency room at once. NOTE: This medicine is only for you. Do not share this medicine with others. What if I miss a dose? If you miss a dose, take it as soon as you can. If it is almost time for your next dose, take only that dose. Do not take double or extra  doses. What may interact with this medication? [place] ACE inhibitors, like enalapril or lisinopril Diuretics, especially amiloride, eplerenone, spironolactone, or triamterene Lithium NSAIDs, medications for pain and inflammation, like ibuprofen or naproxen Potassium salts or potassium supplements This list may not describe all possible interactions. Give your health care provider a list of all the medicines, herbs, non-prescription drugs, or dietary supplements you use. Also tell them if you smoke, drink alcohol, or use illegal drugs. Some items may interact with your medicine. What should I watch for while using this medication? Visit your care team for regular check ups. Check your blood pressure as directed. Ask your care team what your blood pressure should be. Also, find out when you should contact them. Do not treat yourself for coughs, colds, or pain while you are using this medication without asking your care team for advice. Some medications may increase your blood pressure. Women should inform their care team if they wish to become pregnant or think they might be pregnant. There is a potential for serious side effects to an unborn child. Talk to your care team for more information. You may get drowsy or dizzy. Do not drive, use machinery, or do anything that needs mental alertness until you know how this medication affects you. Do not stand or sit up quickly, especially if you are an older patient. This reduces the risk of dizzy or fainting spells. Alcohol can make you more drowsy and dizzy. Avoid alcoholic drinks.  Avoid salt substitutes unless you are told otherwise by your care team. What side effects may I notice from receiving this medication? Side effects that you should report to your care team as soon as possible: Allergic reactions--skin rash, itching, hives, swelling of the face, lips, tongue, or throat High potassium level--muscle weakness, fast or irregular heartbeat Kidney  injury--decrease in the amount of urine, swelling of the ankles, hands, or feet Low blood pressure--dizziness, feeling faint or lightheaded, blurry vision Side effects that usually do not require medical attention (report to your care team if they continue or are bothersome): Dizziness Headache Runny or stuffy nose This list may not describe all possible side effects. Call your doctor for medical advice about side effects. You may report side effects to FDA at 1-800-FDA-1088. Where should I keep my medication? Keep out of the reach of children and pets. Store at room temperature between 20 and 25 degrees C (68 and 77 degrees F). Protect from light. Keep the container tightly closed. Get rid of any unused medication after the expiration date. To get rid of medications that are no longer needed or have expired: Take the medication to a medication take-back program. Check with your pharmacy or law enforcement to find a location. If you cannot return the medication, check the label or package insert to see if the medication should be thrown out in the garbage or flushed down the toilet. If you are not sure, ask your care team. If it is safe to put in the trash, empty the medication out of the container. Mix the medication with cat litter, dirt, coffee grounds, or other unwanted substance. Seal the mixture in a bag or container. Put it in the trash. NOTE: This sheet is a summary. It may not cover all possible information. If you have questions about this medicine, talk to your doctor, pharmacist, or health care provider.  2022 Elsevier/Gold Standard (2020-10-29 00:00:00)

## 2021-03-06 NOTE — Addendum Note (Signed)
Addended by: Magda Bernheim on: 03/06/2021 04:09 PM   Modules accepted: Orders

## 2021-03-06 NOTE — Progress Notes (Signed)
FOLLOW UP 3 MONTH  Assessment and Plan:   Abiel was seen today for follow-up.  Diagnoses and all orders for this visit:  Essential hypertension Continue current medications: Atenolol 100 mg 1/2 tab QD, HCTZ 25mg  QD Add Losartan 50 mg QD Monitor blood pressure at home; call if consistently over 130/80 Continue DASH diet.   Follow up in 2 weeks Reminder to go to the ER if any CP, SOB, nausea, dizziness, severe HA, changes vision/speech, left arm numbness and tingling and jaw pain. -     COMPLETE METABOLIC PANEL WITH GFR -     CBC with Differential/Platelet  Hyperlipidemia Continue medications: Pravastatin 20 mg QD Discussed dietary and exercise modifications Low fat diet -     Lipid panel  Other abnormal glucose (hx of prediabetes) Discussed dietary and exercise modifications  Idiopathic chronic gout without tophus, unspecified site Continue allopurinol 300mg  daily No recent flares Discussed dietary modifications Continue to monitor  Vitamin D deficiency Continue supplementation to maintain goal of 60-100 Taking Vitamin D 4,000 IU daily  Gastroesophageal reflux disease, unspecified whether esophagitis present Doing well at this time Continue with benefit Diet discussed Monitor for triggers Avoid food with high acid content Avoid excessive cafeine Increase water intake -     famotidine (PEPCID) 40 MG tablet; Take 1 tablet (40 mg total) by mouth every evening.  Benign prostatic hyperplasia with lower urinary tract symptoms PSA- previously elevated at work if continues to be elevated needs referral to urologist Tamsulosin 0.4 mg QD  Overweight (BMI 25.0-29.9) Discussed dietary and exercise modifications  Medication management Continued  Chest Pain EKG normal Continue to monitor, take Famotidine, possibly due to anxiety as he is very concerned about his blood pressure Go to the ER if any worsening of chest pain or pain does not resolve, shortness of breath,  nausea, dizziness, severe HA, changes vision/speech   Discussed med's effects and SE's. Screening labs and tests as requested with regular follow-up as recommended. Over 30 minutes of face to face exam, counseling, chart review and critical decision making was performed Future Appointments  Date Time Provider [place]  04/04/2021  2:30 PM Bernarda Caffey, MD [facility] None  05/05/2021  2:00 PM Magda Bernheim, NP [facility] None     HPI 66 yo male patient presents for 3 month follow up for HTN, HLD, GERD, gout, Vitamin D deficiency, and weight.  Yearly he has labs and exam at work.  On exam yesterday his BP was elevated to 152/100- heart rate 48-49. He has been noticing a pain in left side of chest which he describes as a fullness.  Started today, nothing makes it worse or better.  PSA was tested at his work 12/17/20 and value was 9.8.  He can start stream but does not feel like he empties his bladder.  He gets up 4-5 times a night to urinate   His blood pressure has been controlled at home, today their BP is BP: 140/88  BP Readings from Last 3 Encounters:  03/06/21 140/88  11/12/20 138/80  08/30/20 (!) 142/86     He is taking Famotidine for GERD.  Reports this is working well for him.   He denies chest pain, shortness of breath, dizziness.  He is on cholesterol medication, pravachol 40mg  half tablet daily and denies myalgias. His cholesterol is not at goal. The cholesterol last visit was:   Lab Results  Component Value Date   CHOL 164 11/12/2020   HDL 43 11/12/2020   LDLCALC  99 11/12/2020   TRIG 122 11/12/2020   CHOLHDL 3.8 11/12/2020   He has not been working on diet and exercise for prediabetes, he is on bASA, he is not on ACE/ARB and denies hyperglycemia, hypoglycemia , nausea, polydipsia and polyuria. Last A1C in the office was:  Lab Results  Component Value Date   HGBA1C 5.5 11/12/2020   Last GFR:  Lab Results  Component Value Date   GFRAA 108 05/02/2020    Patient is on Vitamin D supplement.   Lab Results  Component Value Date   VD25OH 42 11/12/2020     Last PSA was: Lab Results  Component Value Date   PSA 1.70 05/02/2020    Current Medications:  Current Outpatient Medications on File Prior to Visit  Medication Sig Dispense Refill   allopurinol (ZYLOPRIM) 300 MG tablet Take 1 tablet Daily to Prevent Gout 90 tablet 1   alum & mag hydroxide-simeth (MAALOX/MYLANTA) 200-200-20 MG/5ML suspension Take 30 mLs by mouth 2 (two) times daily.     Ascorbic Acid (VITAMIN C) 1000 MG tablet Take 1,000 mg by mouth daily.     aspirin EC 81 MG tablet Take 81 mg by mouth daily.     atenolol (TENORMIN) 100 MG tablet Take 1 tablet  Daily for BP /  Patient knows to take by mouth 90 tablet 3   B Complex-C (SUPER B COMPLEX PO) Take 1 tablet by mouth daily.     Cholecalciferol (VITAMIN D) 2000 UNITS CAPS Take 4,000 Units by mouth daily.     famotidine (PEPCID) 40 MG tablet Take 1 tablet (40 mg total) by mouth every evening. 90 tablet 2   Flaxseed, Linseed, (FLAX SEED OIL) 1000 MG CAPS Take 1,000 mg by mouth daily.     fluticasone (FLONASE) 50 MCG/ACT nasal spray Place 2 sprays into both nostrils daily. 48 g 3   gabapentin (NEURONTIN) 300 MG capsule Take 1 capsule (300 mg total) by mouth 2 (two) times daily as needed. 180 capsule 2   hydrochlorothiazide (HYDRODIURIL) 25 MG tablet Take  1 tablet  Daily  for BP & Fluid Retention /Ankle Swelling - Patient knows to take by mouth 90 tablet 3   loratadine (EQ LORATADINE) 10 MG tablet TAKE 1 TABLET BY MOUTH DAILY FOR ALLERGIES 90 tablet 0   Omega-3 Fatty Acids (FISH OIL) 1000 MG CAPS Take 1,000 mg by mouth daily.     pravastatin (PRAVACHOL) 20 MG tablet Take     1 tablet       at Bedtime        for Cholesterol 90 tablet 0   prednisoLONE acetate (PRED FORTE) 1 % ophthalmic suspension Place 1 drop into the right eye 4 (four) times daily. 10 mL 0   sildenafil (VIAGRA) 100 MG tablet Take 1/2 to 1 tablet /day  ONLY if  needed for XXXX 30 tablet 0   acyclovir (ZOVIRAX) 800 MG tablet Take 1 tablet  Daily  to Prevent Fever Blisters & Cold Sores  /  Patient knows to take by mouth (Patient not taking: Reported on 11/12/2020) 90 tablet 0   bacitracin-polymyxin b (POLYSPORIN) ophthalmic ointment Place into the right eye at bedtime. Place a 1/2 inch ribbon of ointment into the lower eyelid. (Patient not taking: Reported on 03/06/2021) 3.5 g 0   cyclobenzaprine (FLEXERIL) 10 MG tablet Take 10 mg by mouth at bedtime as needed. (Patient not taking: Reported on 03/06/2021)     No current facility-administered medications on file prior to visit.  Allergies:  Allergies  Allergen Reactions   Ace Inhibitors     REACTION: cough   Trilipix [Choline Fenofibrate] Other (See Comments)    Heart burn   Health Maintenance:  Immunization History  Administered Date(s) Administered   PPD Test 02/13/2014   Pneumococcal Polysaccharide-23 02/23/2002   Tdap 05/15/2009, 05/02/2020   Tetanus: 2011 will wait until after COVID vaccine Pneumovax: 2004 N/A Prevnar 13: N/A Flu vaccine: 2020, at work Zostavax: Due dicussed   HIV : screening 2018 non-reactive  Colonoscopy: 2018, Due 2021 Polyps EGD: 2008 Eye Exam: 2019, Due for 2020 Dentist: 2019, Due for 2020  Patient Care Team: Unk Pinto, MD as PCP - General (Internal Medicine) Armbruster, Carlota Raspberry, MD as Consulting Physician (Gastroenterology)  Medical History:  has Hyperlipidemia; Essential hypertension; GERD; Vitamin D deficiency; Other abnormal glucose (hx of prediabetes); Medication management; Overweight (BMI 25.0-29.9); and Gout on their problem list. Surgical History:  He  has a past surgical history that includes Back surgery (2012); Laparoscopic inguinal hernia with umbilical hernia (Right, 08/15/2013); Insertion of mesh (N/A, 08/15/2013); Hernia repair; Vitrectomy 25 gauge with scleral buckle (Right, 08/30/2020); Photocoagulation with laser (Right, 08/30/2020); Gas  insertion (Right, 08/30/2020); Gas/fluid exchange (Right, 08/30/2020); and Perfluorone injection (Right, 08/30/2020). Family History:  His family history includes Colon cancer (age of onset: 40) in his brother; Diabetes in his mother; Hypertension in his brother, brother, brother, and mother. Social History:   reports that he quit smoking about 19 years ago. His smoking use included cigarettes. He has never used smokeless tobacco. He reports that he does not drink alcohol and does not use drugs. Review of Systems:  Review of Systems  Constitutional: Negative.  Negative for chills, fever and weight loss.  HENT: Negative.  Negative for congestion and hearing loss.   Eyes: Negative.  Negative for blurred vision and double vision.  Respiratory: Negative.  Negative for cough and shortness of breath.   Cardiovascular:  Positive for chest pain (fullness in left lower chest). Negative for palpitations, orthopnea and leg swelling.  Gastrointestinal: Negative.  Negative for abdominal pain, constipation, diarrhea, heartburn, nausea and vomiting.  Genitourinary: Negative.   Musculoskeletal: Negative.  Negative for falls, joint pain and myalgias.  Skin: Negative.  Negative for rash.  Neurological:  Negative for dizziness, tingling, tremors, loss of consciousness and headaches.  Psychiatric/Behavioral:  Negative for depression, memory loss and suicidal ideas.    Physical Exam: Estimated body mass index is 30.74 kg/m as calculated from the following:   Height as of 11/12/20: 5\' 8"  (1.727 m).   Weight as of this encounter: 202 lb 3.2 oz (91.7 kg). BP 140/88    Pulse (!) 56    Temp (!) 97.5 F (36.4 C)    Wt 202 lb 3.2 oz (91.7 kg)    SpO2 94%    BMI 30.74 kg/m  General Appearance: Well nourished, in no apparent distress.  Eyes: PERRLA, EOMs, conjunctiva no swelling or erythema, normal fundi and vessels.  Sinuses: No Frontal/maxillary tenderness  ENT/Mouth: Ext aud canals clear, normal light reflex with TMs  without erythema, bulging. Good dentition. No erythema, swelling, or exudate on post pharynx. Tonsils not swollen or erythematous. Hearing normal.  Neck: Supple, thyroid normal. No bruits  Respiratory: Respiratory effort normal, BS equal bilaterally without rales, rhonchi, wheezing or stridor.  Cardio: RRR without murmurs, rubs or gallops. Brisk peripheral pulses without edema.  Chest: symmetric, with normal excursions and percussion.  Abdomen: Soft, nontender, no guarding, rebound, hernias, masses, or organomegaly.  Lymphatics:  Non tender without lymphadenopathy.  Musculoskeletal: Full ROM all peripheral extremities,5/5 strength, and normal gait.  Skin: Warm, dry without rashes, lesions, ecchymosis. Neuro: Cranial nerves intact, reflexes equal bilaterally. Normal muscle tone, no cerebellar symptoms. Sensation intact.  Psych: Awake and oriented X 3, normal affect, Insight and Judgment appropriate.  EKG: Sinus bradycardia, no ST changes  Keiaira Donlan W Kazuto Sevey 5:45 PM [facility]

## 2021-03-07 LAB — COMPLETE METABOLIC PANEL WITH GFR
AG Ratio: 1.6 (calc) (ref 1.0–2.5)
ALT: 9 U/L (ref 9–46)
AST: 20 U/L (ref 10–35)
Albumin: 4.2 g/dL (ref 3.6–5.1)
Alkaline phosphatase (APISO): 95 U/L (ref 35–144)
BUN: 14 mg/dL (ref 7–25)
CO2: 32 mmol/L (ref 20–32)
Calcium: 9.6 mg/dL (ref 8.6–10.3)
Chloride: 102 mmol/L (ref 98–110)
Creat: 0.97 mg/dL (ref 0.70–1.35)
Globulin: 2.6 g/dL (calc) (ref 1.9–3.7)
Glucose, Bld: 94 mg/dL (ref 65–99)
Potassium: 3.5 mmol/L (ref 3.5–5.3)
Sodium: 139 mmol/L (ref 135–146)
Total Bilirubin: 0.6 mg/dL (ref 0.2–1.2)
Total Protein: 6.8 g/dL (ref 6.1–8.1)
eGFR: 87 mL/min/{1.73_m2} (ref >=60)

## 2021-03-07 LAB — CBC WITH DIFFERENTIAL/PLATELET
Absolute Monocytes: 602 cells/uL (ref 200–950)
Basophils Absolute: 42 cells/uL (ref 0–200)
Basophils Relative: 0.9 %
Eosinophils Absolute: 249 cells/uL (ref 15–500)
Eosinophils Relative: 5.3 %
HCT: 43.5 % (ref 38.5–50.0)
Hemoglobin: 14.9 g/dL (ref 13.2–17.1)
Lymphs Abs: 2012 cells/uL (ref 850–3900)
MCH: 31.4 pg (ref 27.0–33.0)
MCHC: 34.3 g/dL (ref 32.0–36.0)
MCV: 91.6 fL (ref 80.0–100.0)
MPV: 11.4 fL (ref 7.5–12.5)
Monocytes Relative: 12.8 %
Neutro Abs: 1795 cells/uL (ref 1500–7800)
Neutrophils Relative %: 38.2 %
Platelets: 157 10*3/uL (ref 140–400)
RBC: 4.75 10*6/uL (ref 4.20–5.80)
RDW: 12.8 % (ref 11.0–15.0)
Total Lymphocyte: 42.8 %
WBC: 4.7 10*3/uL (ref 3.8–10.8)

## 2021-03-07 LAB — PSA: PSA: 1.93 ng/mL (ref <=4.00)

## 2021-03-07 LAB — HEMOGLOBIN A1C
Hgb A1c MFr Bld: 5.7 % of total Hgb — ABNORMAL HIGH (ref <5.7)
Mean Plasma Glucose: 117 mg/dL
eAG (mmol/L): 6.5 mmol/L

## 2021-03-07 LAB — TSH: TSH: 0.77 mIU/L (ref 0.40–4.50)

## 2021-03-07 LAB — LIPID PANEL
Cholesterol: 180 mg/dL (ref <200)
HDL: 37 mg/dL — ABNORMAL LOW (ref >=40)
LDL Cholesterol (Calc): 112 mg/dL (calc) — ABNORMAL HIGH
Non-HDL Cholesterol (Calc): 143 mg/dL (calc) — ABNORMAL HIGH (ref <130)
Total CHOL/HDL Ratio: 4.9 (calc) (ref <5.0)
Triglycerides: 195 mg/dL — ABNORMAL HIGH (ref <150)

## 2021-03-24 NOTE — Progress Notes (Signed)
Assessment and Plan:  Caleb Johns was seen today for follow-up.  Diagnoses and all orders for this visit:  Hyperlipidemia -     rosuvastatin (CRESTOR) 20 MG tablet; Take 1 tablet (20 mg total) by mouth daily. Stop Pravastatin and begin Rosuvastatin Continue to limit red meat, dairy, eggs and fried foods  Essential hypertension Decrease Losartan to 1/2 tab daily Continue Atenolol 100 mg 1/2 tab daily, HCTZ 25 mg daily - continue DASH diet, exercise and monitor at home. Call if greater than 130/80.   Go to the ER if any chest pain, shortness of breath, nausea, dizziness, severe HA, changes vision/speech   Urinary frequency If culture is negative will refer to urology for evaluation -     Urine Culture       Further disposition pending results of labs. Discussed med's effects and SE's.   Over 30 minutes of exam, counseling, chart review, and critical decision making was performed.   Future Appointments  Date Time Provider [place]  04/04/2021  2:30 PM Bernarda Caffey, MD [facility] None  05/05/2021  2:00 PM Magda Bernheim, NP [facility] None    ------------------------------------------------------------------------------------------------------------------   HPI BP 102/68    Pulse (!) 56    Temp (!) 97.5 F (36.4 C)    Wt 202 lb 6.4 oz (91.8 kg)    SpO2 97%    BMI 30.77 kg/m  66 y.o.male presents for hypertension and mixed hyperlipidemia  BMI is Body mass index is 30.77 kg/m., he has been working on diet and exercise. Wt Readings from Last 3 Encounters:  03/26/21 202 lb 6.4 oz (91.8 kg)  03/06/21 202 lb 3.2 oz (91.7 kg)  11/12/20 184 lb 9.6 oz (83.7 kg)    Tamsulosin has not helped urinary frequency, he is still getting up 5-10 times a night to go to the bathroom  Cholesterol was elevated at last visit- discuss changing to rosuvastatin. He is not eating red meat.  Eats very little dairy and only a few eggs.  Lab Results  Component Value Date   CHOL 180 03/06/2021    HDL 37 (L) 03/06/2021   LDLCALC 112 (H) 03/06/2021   TRIG 195 (H) 03/06/2021   CHOLHDL 4.9 03/06/2021    His BP is low since starting Losartan.  He had only been taking 1 whole tab will cut Losartan in half BP Readings from Last 3 Encounters:  03/26/21 102/68  03/06/21 140/88  11/12/20 138/80     Past Medical History:  Diagnosis Date   GERD (gastroesophageal reflux disease)    Gout    HSV-1 (herpes simplex virus 1) infection    HSV-2 (herpes simplex virus 2) infection    Hyperlipidemia    Hypertension    Hypogonadism male    Obese    Pneumonia    walking -many years ago   Pre-diabetes      Allergies  Allergen Reactions   Ace Inhibitors     REACTION: cough   Trilipix [Choline Fenofibrate] Other (See Comments)    Heart burn    Current Outpatient Medications on File Prior to Visit  Medication Sig   allopurinol (ZYLOPRIM) 300 MG tablet Take 1 tablet Daily to Prevent Gout   alum & mag hydroxide-simeth (MAALOX/MYLANTA) 200-200-20 MG/5ML suspension Take 30 mLs by mouth 2 (two) times daily.   Ascorbic Acid (VITAMIN C) 1000 MG tablet Take 1,000 mg by mouth daily.   aspirin EC 81 MG tablet Take 81 mg by mouth daily.   atenolol (TENORMIN) 100 MG  tablet Take 1 tablet  Daily for BP /  Patient knows to take by mouth   B Complex-C (SUPER B COMPLEX PO) Take 1 tablet by mouth daily.   Cholecalciferol (VITAMIN D) 2000 UNITS CAPS Take 4,000 Units by mouth daily.   famotidine (PEPCID) 40 MG tablet Take 1 tablet (40 mg total) by mouth every evening.   Flaxseed, Linseed, (FLAX SEED OIL) 1000 MG CAPS Take 1,000 mg by mouth daily.   fluticasone (FLONASE) 50 MCG/ACT nasal spray Place 2 sprays into both nostrils daily.   gabapentin (NEURONTIN) 300 MG capsule Take 1 capsule (300 mg total) by mouth 2 (two) times daily as needed.   hydrochlorothiazide (HYDRODIURIL) 25 MG tablet Take  1 tablet  Daily  for BP & Fluid Retention /Ankle Swelling - Patient knows to take by mouth   loratadine (EQ  LORATADINE) 10 MG tablet TAKE 1 TABLET BY MOUTH DAILY FOR ALLERGIES   losartan (COZAAR) 50 MG tablet Take 1 tablet (50 mg total) by mouth daily.   Omega-3 Fatty Acids (FISH OIL) 1000 MG CAPS Take 1,000 mg by mouth daily.   pravastatin (PRAVACHOL) 20 MG tablet Take     1 tablet       at Bedtime        for Cholesterol   sildenafil (VIAGRA) 100 MG tablet Take 1/2 to 1 tablet /day  ONLY if needed for XXXX   tamsulosin (FLOMAX) 0.4 MG CAPS capsule Take 1 capsule (0.4 mg total) by mouth daily.   prednisoLONE acetate (PRED FORTE) 1 % ophthalmic suspension Place 1 drop into the right eye 4 (four) times daily.   No current facility-administered medications on file prior to visit.    ROS: all negative except above.   Physical Exam:  BP 102/68    Pulse (!) 56    Temp (!) 97.5 F (36.4 C)    Wt 202 lb 6.4 oz (91.8 kg)    SpO2 97%    BMI 30.77 kg/m   General Appearance: Well nourished, in no apparent distress. Eyes: PERRLA, EOMs, conjunctiva no swelling or erythema Sinuses: No Frontal/maxillary tenderness ENT/Mouth: Ext aud canals clear, TMs without erythema, bulging. No erythema, swelling, or exudate on post pharynx.  Tonsils not swollen or erythematous. Hearing normal.  Neck: Supple, thyroid normal.  Respiratory: Respiratory effort normal, BS equal bilaterally without rales, rhonchi, wheezing or stridor.  Cardio: RRR with no MRGs. Brisk peripheral pulses without edema.  Abdomen: Soft, + BS.  Non tender, no guarding, rebound, hernias, masses. Lymphatics: Non tender without lymphadenopathy.  Musculoskeletal: Full ROM, 5/5 strength, normal gait.  Skin: Warm, dry without rashes, lesions, ecchymosis.  Neuro: Cranial nerves intact. Normal muscle tone, no cerebellar symptoms. Sensation intact.  Psych: Awake and oriented X 3, normal affect, Insight and Judgment appropriate.     Magda Bernheim, NP 3:41 PM [facility]

## 2021-03-26 ENCOUNTER — Other Ambulatory Visit: Payer: Self-pay

## 2021-03-26 ENCOUNTER — Encounter: Payer: Self-pay | Admitting: Nurse Practitioner

## 2021-03-26 ENCOUNTER — Ambulatory Visit: Payer: BC Managed Care – PPO | Admitting: Nurse Practitioner

## 2021-03-26 VITALS — BP 102/68 | HR 56 | Temp 97.5°F | Wt 202.4 lb

## 2021-03-26 DIAGNOSIS — I10 Essential (primary) hypertension: Secondary | ICD-10-CM | POA: Diagnosis not present

## 2021-03-26 DIAGNOSIS — E782 Mixed hyperlipidemia: Secondary | ICD-10-CM

## 2021-03-26 DIAGNOSIS — R350 Frequency of micturition: Secondary | ICD-10-CM

## 2021-03-26 MED ORDER — LOSARTAN POTASSIUM 50 MG PO TABS: 50.0000 mg | ORAL_TABLET | Freq: Every day | ORAL | 11 refills | Status: DC

## 2021-03-26 MED ORDER — ROSUVASTATIN CALCIUM 20 MG PO TABS: 20.0000 mg | ORAL_TABLET | Freq: Every day | ORAL | 11 refills | Status: DC

## 2021-03-26 NOTE — Patient Instructions (Signed)
Stop Pravastatin Start Rosuvastatin 1 tab daily for cholesterol  Take 1/2 tab of Losartan daily instead of 1 tab  Stop tamsulosin for urination.  If continues to have more urinary frequency recommend referral to urologist.

## 2021-03-27 LAB — URINE CULTURE
MICRO NUMBER:: 12950061
Result:: NO GROWTH
SPECIMEN QUALITY:: ADEQUATE

## 2021-04-02 NOTE — Progress Notes (Signed)
[facility] Note  04/04/2021     CHIEF COMPLAINT Patient presents for Retina Follow Up    HISTORY OF PRESENT ILLNESS: Caleb Johns is a 66 y.o. male who presents to the clinic today for:   HPI     Retina Follow Up   Patient presents with  Retinal Break/Detachment.  In right eye.  This started 4 months ago.  I, the attending physician,  performed the HPI with the patient and updated documentation appropriately.        Comments   Patient here for 4 months retina follow up for RD OD. Patient states vision doing pretty good. Went to other eye doctor. Doctor said doing good. Had cataract surgery OD February 14, 2021. No eye pain. Finishing up drops from cat surgery.      Last edited by Bernarda Caffey, MD on 04/05/2021  1:55 AM.      Referring physician: Shirleen Schirmer PA [address] [place], [place] [address]  HISTORICAL INFORMATION:   Selected notes from the MEDICAL RECORD NUMBER Referred by Shirleen Schirmer, PA for RD OD LEE: 08/29/2020 Ocular Hx- RD OD BCVA OD CF OS 20/25 PMH- gout, HTN   CURRENT MEDICATIONS: Current Outpatient Medications (Ophthalmic Drugs)  Medication Sig   prednisoLONE acetate (PRED FORTE) 1 % ophthalmic suspension Place 1 drop into the right eye 4 (four) times daily.   No current facility-administered medications for this visit. (Ophthalmic Drugs)   Current Outpatient Medications (Other)  Medication Sig   allopurinol (ZYLOPRIM) 300 MG tablet Take 1 tablet Daily to Prevent Gout   alum & mag hydroxide-simeth (MAALOX/MYLANTA) 200-200-20 MG/5ML suspension Take 30 mLs by mouth 2 (two) times daily.   Ascorbic Acid (VITAMIN C) 1000 MG tablet Take 1,000 mg by mouth daily.   aspirin EC 81 MG tablet Take 81 mg by mouth daily.   atenolol (TENORMIN) 100 MG tablet Take 1 tablet  Daily for BP /  Patient knows to take by mouth   B Complex-C (SUPER B COMPLEX PO) Take 1 tablet by mouth daily.   Cholecalciferol (VITAMIN D)  2000 UNITS CAPS Take 4,000 Units by mouth daily.   famotidine (PEPCID) 40 MG tablet Take 1 tablet (40 mg total) by mouth every evening.   Flaxseed, Linseed, (FLAX SEED OIL) 1000 MG CAPS Take 1,000 mg by mouth daily.   fluticasone (FLONASE) 50 MCG/ACT nasal spray Place 2 sprays into both nostrils daily.   gabapentin (NEURONTIN) 300 MG capsule Take 1 capsule (300 mg total) by mouth 2 (two) times daily as needed.   hydrochlorothiazide (HYDRODIURIL) 25 MG tablet Take  1 tablet  Daily  for BP & Fluid Retention /Ankle Swelling - Patient knows to take by mouth   loratadine (EQ LORATADINE) 10 MG tablet TAKE 1 TABLET BY MOUTH DAILY FOR ALLERGIES   losartan (COZAAR) 50 MG tablet Take 1 tablet (50 mg total) by mouth daily.   Omega-3 Fatty Acids (FISH OIL) 1000 MG CAPS Take 1,000 mg by mouth daily.   rosuvastatin (CRESTOR) 20 MG tablet Take 1 tablet (20 mg total) by mouth daily.   sildenafil (VIAGRA) 100 MG tablet Take 1/2 to 1 tablet /day  ONLY if needed for XXXX   tamsulosin (FLOMAX) 0.4 MG CAPS capsule Take 1 capsule (0.4 mg total) by mouth daily.   No current facility-administered medications for this visit. (Other)   REVIEW OF SYSTEMS: ROS   Positive for: Eyes, Respiratory Negative for: Constitutional, Gastrointestinal, Neurological, Skin,  Genitourinary, Musculoskeletal, HENT, Endocrine, Cardiovascular, Psychiatric, Allergic/Imm, Heme/Lymph Last edited by Theodore Demark, COA on 04/04/2021  2:30 PM.      ALLERGIES Allergies  Allergen Reactions   Ace Inhibitors     REACTION: cough   Trilipix [Choline Fenofibrate] Other (See Comments)    Heart burn   PAST MEDICAL HISTORY Past Medical History:  Diagnosis Date   GERD (gastroesophageal reflux disease)    Gout    HSV-1 (herpes simplex virus 1) infection    HSV-2 (herpes simplex virus 2) infection    Hyperlipidemia    Hypertension    Hypogonadism male    Obese    Pneumonia    walking -many years ago   Pre-diabetes    Past Surgical  History:  Procedure Laterality Date   BACK SURGERY  2012   on back-hernia   GAS INSERTION Right 08/30/2020   Procedure: INSERTION OF GAS - C3F8;  Surgeon: Bernarda Caffey, MD;  Location: [facility];  Service: Ophthalmology;  Laterality: Right;   GAS/FLUID EXCHANGE Right 08/30/2020   Procedure: GAS/FLUID EXCHANGE;  Surgeon: Bernarda Caffey, MD;  Location: [facility];  Service: Ophthalmology;  Laterality: Right;   HERNIA REPAIR     INSERTION OF MESH N/A 08/15/2013   Procedure: INSERTION OF MESH;  Surgeon: Odis Hollingshead, MD;  Location: [facility];  Service: General;  Laterality: N/A;   LAPAROSCOPIC INGUINAL HERNIA WITH UMBILICAL HERNIA Right 07/03/209   Procedure: LAPAROSCOPIC RIGH  INGUINAL HERNIA WITH OPEN UMBILICAL HERNIA;  Surgeon: Odis Hollingshead, MD;  Location: [facility];  Service: General;  Laterality: Right;  umbilical repair with mesh   PERFLUORONE INJECTION Right 08/30/2020   Procedure: PERFLUORONE INJECTION;  Surgeon: Bernarda Caffey, MD;  Location: [facility];  Service: Ophthalmology;  Laterality: Right;   PHOTOCOAGULATION WITH LASER Right 08/30/2020   Procedure: PHOTOCOAGULATION WITH LASER;  Surgeon: Bernarda Caffey, MD;  Location: [facility];  Service: Ophthalmology;  Laterality: Right;   VITRECTOMY 25 GAUGE WITH SCLERAL BUCKLE Right 08/30/2020   Procedure: SCLERAL BUCKLE WITH 25 GAUGE PARS PLANA VITRECTOMY;  Surgeon: Bernarda Caffey, MD;  Location: [facility];  Service: Ophthalmology;  Laterality: Right;    FAMILY HISTORY Family History  Problem Relation Age of Onset   Diabetes Mother    Hypertension Mother    Hypertension Brother    Hypertension Brother    Hypertension Brother    Colon cancer Brother 16   Colon polyps Neg Hx    Esophageal cancer Neg Hx    Rectal cancer Neg Hx    Stomach cancer Neg Hx     SOCIAL HISTORY Social History   Tobacco Use   Smoking status: Former    Types: Cigarettes    Quit date: 02/23/2002    Years since quitting: 19.1   Smokeless tobacco: Never  Vaping Use   Vaping Use: Never  used  Substance Use Topics   Alcohol use: No   Drug use: No       OPHTHALMIC EXAM: Base Eye Exam     Visual Acuity (Snellen - Linear)       Right Left   Dist [place] 20/20 -1 20/20 -1         Tonometry (Tonopen, 2:26 PM)       Right Left   Pressure 13 15         Pupils       Dark Light Shape React APD   Right 4 3 Round Brisk None   Left 3 2 Round Brisk None  Visual Fields (Counting fingers)       Left Right    Full Full         Extraocular Movement       Right Left    Full, Ortho Full, Ortho         Neuro/Psych     Oriented x3: Yes   Mood/Affect: Normal         Dilation     Both eyes: 1.0% Mydriacyl, 2.5% Phenylephrine @ 2:26 PM           Slit Lamp and Fundus Exam     Slit Lamp Exam       Right Left   Lids/Lashes Dermatochalasis - upper lid Dermatochalasis - upper lid   Conjunctiva/Sclera White and quiet, melanosis melanosis   Cornea arcus, fine endo pigment inferiorly, Well healed temporal cataract wound arcus, 2+ fine PEE   Anterior Chamber deep and clear; narrow angles deep and clear   Iris no NVI, Round and moderately dilated round and dilated, no NVI   Lens PC IOL in good position, mild pigment 2+ NS, 2+CS   Anterior Vitreous post vitrectomy, anterior hyaloid with mild pigment syneresis, +pigment         Fundus Exam       Right Left   Disc pink and sharp pink and sharp   C/D Ratio 0.1 0.2   Macula Flat/re-attached, good foveal reflex, trace ERM, mild RPE mottling and clumping, no edema, [place], focal IRH along ST arcades flat, good foveal reflex, mild RPE mottling, focal dot heme focal temp to fovea, no edema   Vessels attenuated, mild av crossing changes, mild Copper wiring, mild tortuousity attenuated, Tortuous, Copper wiring   Periphery retina attached over buckle; good buckle height; good laser over buckle; focal fibrosis overlaying buckle at 1200, ORIGINALLY: Bullous detachment from 1030 to 0130, multiple retinal  holes and tears at 1100, 1200, and 0115 attached, no heme, no RT/RD on 360 exam           IMAGING AND PROCEDURES  Imaging and Procedures for 04/04/2021  OCT, Retina - OU - Both Eyes       Right Eye Quality was good. Central Foveal Thickness: 290. Progression has been stable. Findings include no IRF, no SRF, normal foveal contour, outer retinal atrophy, myopic contour (Retina re-attached, interval improvement in ellipsoid signal).   Left Eye Quality was good. Central Foveal Thickness: 260. Progression has been stable. Findings include normal foveal contour, no IRF, no SRF.   Notes *Images captured and stored on drive  Diagnosis / Impression:  OD: Retina re-attached, interval improvement ellipsoid signal OS: NFP, no IRF/SRF  Clinical management:  See below  Abbreviations: NFP - Normal foveal profile. CME - cystoid macular edema. PED - pigment epithelial detachment. IRF - intraretinal fluid. SRF - subretinal fluid. EZ - ellipsoid zone. ERM - epiretinal membrane. ORA - outer retinal atrophy. ORT - outer retinal tubulation. SRHM - subretinal hyper-reflective material. IRHM - intraretinal hyper-reflective material            ASSESSMENT/PLAN:    ICD-10-CM   1. Right retinal detachment  H33.21 OCT, Retina - OU - Both Eyes    2. Essential hypertension  I10     3. Hypertensive retinopathy of both eyes  H35.033 OCT, Retina - OU - Both Eyes    4. Combined forms of age-related cataract of left eye  H25.812     5. Pseudophakia  Z96.1      1. Rhegmatogenous retinal detachment  with multiple holes/tears OD - bullous superior mac off detachment, onset of foveal involvement, 08/29/20 by history - detached from 1030 to 0130, with multiple retinal holes/tears at 1100,1200, and 0115--posterior extension of SRF through fovea/macula - s/p #41 SBP + 25g PPV/PFO/EL/C3F8 OD, 07.08.22 - doing well, BCVA 20/20 (post cataract sx) - retina attached and in good position -- good buckle height  and laser around breaks             - IOP 16 - f/u 9-12 months, DFE, OCT  2,3. Hypertensive retinopathy OU - discussed importance of tight BP control - monitor   4. Age related cataracts OS - The symptoms of cataract, surgical options, and treatments and risks were discussed with patient - under the expert management of Dr. Katy Fitch  5. Pseudophakia OD  - s/p CE/IOL OD, 12.23.22 with Dr. Katy Fitch  - IOL in good position, doing well  - monitor   Ophthalmic Meds Ordered this visit:  No orders of the defined types were placed in this encounter.    Return 9-12 months, for DFE, OCT.  There are no Patient Instructions on file for this visit.  This document serves as a record of services personally performed by Gardiner Sleeper, MD, PhD. It was created on their behalf by Leonie Douglas, an ophthalmic technician. The creation of this record is the provider's dictation and/or activities during the visit.    Electronically signed by: Leonie Douglas COA, 04/05/21  1:56 AM  This document serves as a record of services personally performed by Gardiner Sleeper, MD, PhD. It was created on their behalf by Leonie Douglas, an ophthalmic technician. The creation of this record is the provider's dictation and/or activities during the visit.    Electronically signed by: Leonie Douglas COA, 04/05/21  1:56 AM   Gardiner Sleeper, M.D., Ph.D. Diseases & Surgery of the Retina and Vitreous [facility]  I have reviewed the above documentation for accuracy and completeness, and I agree with the above. Gardiner Sleeper, M.D., Ph.D. 04/05/21 1:58 AM   Abbreviations: M myopia (nearsighted); A astigmatism; H hyperopia (farsighted); P presbyopia; Mrx spectacle prescription;  CTL contact lenses; OD right eye; OS left eye; OU both eyes  XT exotropia; ET esotropia; PEK punctate epithelial keratitis; PEE punctate epithelial erosions; DES dry eye syndrome; MGD meibomian gland dysfunction; ATs artificial  tears; PFAT's preservative free artificial tears; [place] nuclear sclerotic cataract; PSC posterior subcapsular cataract; ERM epi-retinal membrane; PVD posterior vitreous detachment; RD retinal detachment; DM diabetes mellitus; DR diabetic retinopathy; NPDR non-proliferative diabetic retinopathy; PDR proliferative diabetic retinopathy; CSME clinically significant macular edema; DME diabetic macular edema; dbh dot blot hemorrhages; CWS cotton wool spot; POAG primary open angle glaucoma; C/D cup-to-disc ratio; HVF humphrey visual field; GVF goldmann visual field; OCT optical coherence tomography; IOP intraocular pressure; BRVO Branch retinal vein occlusion; CRVO central retinal vein occlusion; CRAO central retinal artery occlusion; BRAO branch retinal artery occlusion; RT retinal tear; SB scleral buckle; PPV pars plana vitrectomy; VH Vitreous hemorrhage; PRP panretinal laser photocoagulation; IVK intravitreal kenalog; VMT vitreomacular traction; MH Macular hole;  NVD neovascularization of the disc; NVE neovascularization elsewhere; AREDS age related eye disease study; ARMD age related macular degeneration; POAG primary open angle glaucoma; EBMD epithelial/anterior basement membrane dystrophy; ACIOL anterior chamber intraocular lens; IOL intraocular lens; PCIOL posterior chamber intraocular lens; Phaco/IOL phacoemulsification with intraocular lens placement; [place] photorefractive keratectomy; LASIK laser assisted in situ keratomileusis; HTN hypertension; DM diabetes mellitus; COPD chronic obstructive pulmonary disease

## 2021-04-04 ENCOUNTER — Encounter (INDEPENDENT_AMBULATORY_CARE_PROVIDER_SITE_OTHER): Payer: Self-pay | Admitting: Ophthalmology

## 2021-04-04 ENCOUNTER — Ambulatory Visit (INDEPENDENT_AMBULATORY_CARE_PROVIDER_SITE_OTHER): Payer: BC Managed Care – PPO | Admitting: Ophthalmology

## 2021-04-04 ENCOUNTER — Other Ambulatory Visit: Payer: Self-pay

## 2021-04-04 DIAGNOSIS — Z961 Presence of intraocular lens: Secondary | ICD-10-CM

## 2021-04-04 DIAGNOSIS — H35033 Hypertensive retinopathy, bilateral: Secondary | ICD-10-CM

## 2021-04-04 DIAGNOSIS — I10 Essential (primary) hypertension: Secondary | ICD-10-CM

## 2021-04-04 DIAGNOSIS — H3321 Serous retinal detachment, right eye: Secondary | ICD-10-CM | POA: Diagnosis not present

## 2021-04-04 DIAGNOSIS — H25812 Combined forms of age-related cataract, left eye: Secondary | ICD-10-CM

## 2021-04-04 DIAGNOSIS — H25813 Combined forms of age-related cataract, bilateral: Secondary | ICD-10-CM

## 2021-04-05 ENCOUNTER — Encounter (INDEPENDENT_AMBULATORY_CARE_PROVIDER_SITE_OTHER): Payer: Self-pay | Admitting: Ophthalmology

## 2021-04-15 ENCOUNTER — Other Ambulatory Visit: Payer: Self-pay | Admitting: Internal Medicine

## 2021-04-15 DIAGNOSIS — B009 Herpesviral infection, unspecified: Secondary | ICD-10-CM

## 2021-04-30 NOTE — Progress Notes (Signed)
Complete Physical  Assessment and Plan:  Encounter for general adult medical examination with abnormal findings Yearly Tdap received today  Essential hypertension -     CBC with Differential/Platelet -     COMPLETE METABOLIC PANEL WITH GFR -     TSH -     Urinalysis, Routine w reflex microscopic -     Microalbumin / creatinine urine ratio -     EKG 12-Lead - continue medications, DASH diet, exercise and monitor at home. Call if greater than 130/80.   Hyperlipidemia -     Lipid panel Continue  check lipids decrease fatty foods increase activity.   Other abnormal glucose (hx of prediabetes) -     Hemoglobin A1c Discussed disease progression and risks Discussed diet/exercise, weight management and risk modification  Idiopathic chronic gout without tophus, unspecified site - recheck Uric acid as needed, Diet discussed, continue medications.  Medication management -     Magnesium  Vitamin D deficiency Continue Vit D supplementation to maintain value in therapeutic level of 60-100  -     VITAMIN D 25 Hydroxy (Vit-D Deficiency, Fractures)  Gastroesophageal reflux disease, unspecified whether esophagitis present Focus on dietary and behavior modifications -     famotidine (PEPCID) 40 MG tablet; Take 1 tablet (40 mg total) by mouth every evening.  Obesity Long discussion about weight loss, diet, and exercise Recommended diet heavy in fruits and veggies and low in animal meats, cheeses, and dairy products, appropriate calorie intake Follow up at next visit   Screening PSA (prostate specific antigen) -     PSA  Screening, ischemic heart disease -     EKG 12-Lead  Screening for blood or protein in urine -     Urinalysis w microscopic + reflex cultur  Microalbumin/creatinine urine ratio  Need for pneumococcal vaccine -Prevnar 20 IM  Infected sebaceous cyst -Incised and drained small amount thick white , area indurated Keflex '500mg'$  1 tab PO QID x 10 Monitor-if  develops redness or pain or develops a fever notify the office    Discussed med's effects and SE's. Screening labs and tests as requested with regular follow-up as recommended. Over 40 minutes of exam, counseling, chart review and critical decision making was performed  HPI Patient presents for a complete physical.   has Hyperlipidemia; Essential hypertension; GERD; Vitamin D deficiency; Other abnormal glucose (hx of prediabetes); Medication management; Overweight (BMI 25.0-29.9); and Gout on their problem list.   His blood pressure has been controlled at home, today their BP is BP: (!) 144/82  BP Readings from Last 3 Encounters:  05/05/21 (!) 144/82  03/26/21 102/68  03/06/21 140/88  He does not exercise.  Denies headaches, chest pain, shortness of breath, burred vision and dizziness.   BMI is Body mass index is 30.87 kg/m., he has not been working on diet and exercise. Wt Readings from Last 3 Encounters:  05/05/21 203 lb (92.1 kg)  03/26/21 202 lb 6.4 oz (91.8 kg)  03/06/21 202 lb 3.2 oz (91.7 kg)   He has been having heartburn symptoms more frequently and uses Famotidine as needed. He is also using baking soda as needed.   He has noticed a swollen tender area on his upper back which has been present for several weeks.   He is on cholesterol medication and denies myalgias. His cholesterol is not at goal. The cholesterol last visit was:   Lab Results  Component Value Date   CHOL 180 03/06/2021   HDL 37 (L) 03/06/2021  [place] 112 (H) 03/06/2021   TRIG 195 (H) 03/06/2021   CHOLHDL 4.9 03/06/2021   He has not been working on diet and exercise for prediabetes, he is on bASA, he is not on ACE/ARB and denies hyperglycemia, hypoglycemia , nausea, polydipsia and polyuria. Last A1C in the office was:  Lab Results  Component Value Date   HGBA1C 5.7 (H) 03/06/2021   Last GFR:  Lab Results  Component Value Date   GFRAA 108 05/02/2020   Patient is on Vitamin D supplement.    Lab Results  Component Value Date   VD25OH 42 11/12/2020     Last PSA was: Lab Results  Component Value Date   PSA 1.93 03/06/2021    Current Medications:  Current Outpatient Medications on File Prior to Visit  Medication Sig Dispense Refill   acyclovir (ZOVIRAX) 800 MG tablet TAKE 1 TABLET BY MOUTH DAILY TO PREVENT FEVER BLISTERS AND COLD SORES. 90 tablet 0   allopurinol (ZYLOPRIM) 300 MG tablet Take 1 tablet Daily to Prevent Gout 90 tablet 1   alum & mag hydroxide-simeth (MAALOX/MYLANTA) 200-200-20 MG/5ML suspension Take 30 mLs by mouth 2 (two) times daily.     Ascorbic Acid (VITAMIN C) 1000 MG tablet Take 1,000 mg by mouth daily.     aspirin EC 81 MG tablet Take 81 mg by mouth daily.     atenolol (TENORMIN) 100 MG tablet Take 1 tablet  Daily for BP /  Patient knows to take by mouth 90 tablet 3   B Complex-C (SUPER B COMPLEX PO) Take 1 tablet by mouth daily.     Cholecalciferol (VITAMIN D) 2000 UNITS CAPS Take 4,000 Units by mouth daily.     famotidine (PEPCID) 40 MG tablet Take 1 tablet (40 mg total) by mouth every evening. 90 tablet 2   Flaxseed, Linseed, (FLAX SEED OIL) 1000 MG CAPS Take 1,000 mg by mouth daily.     fluticasone (FLONASE) 50 MCG/ACT nasal spray Place 2 sprays into both nostrils daily. 48 g 3   gabapentin (NEURONTIN) 300 MG capsule Take 1 capsule (300 mg total) by mouth 2 (two) times daily as needed. 180 capsule 2   hydrochlorothiazide (HYDRODIURIL) 25 MG tablet Take  1 tablet  Daily  for BP & Fluid Retention /Ankle Swelling - Patient knows to take by mouth 90 tablet 3   loratadine (EQ LORATADINE) 10 MG tablet TAKE 1 TABLET BY MOUTH DAILY FOR ALLERGIES 90 tablet 0   losartan (COZAAR) 50 MG tablet Take 1 tablet (50 mg total) by mouth daily. 30 tablet 11   Omega-3 Fatty Acids (FISH OIL) 1000 MG CAPS Take 1,000 mg by mouth daily.     prednisoLONE acetate (PRED FORTE) 1 % ophthalmic suspension Place 1 drop into the right eye 4 (four) times daily. 10 mL 0   rosuvastatin  (CRESTOR) 20 MG tablet Take 1 tablet (20 mg total) by mouth daily. 30 tablet 11   sildenafil (VIAGRA) 100 MG tablet Take 1/2 to 1 tablet /day  ONLY if needed for XXXX 30 tablet 0   tamsulosin (FLOMAX) 0.4 MG CAPS capsule Take 1 capsule (0.4 mg total) by mouth daily. 30 capsule 3   No current facility-administered medications on file prior to visit.   Allergies:  Allergies  Allergen Reactions   Ace Inhibitors     REACTION: cough   Trilipix [Choline Fenofibrate] Other (See Comments)    Heart burn   Health Maintenance:  Immunization History  Administered Date(s) Administered   PPD Test  02/13/2014   Pneumococcal Polysaccharide-23 02/23/2002   Tdap 05/15/2009, 05/02/2020   Tetanus:  04/2020 Pneumovax: 2004  Prevnar 20: 05/05/21 Flu vaccine: 2020, at work Zostavax: Due dicussed  [place] 3/21, 4/21 Booster 02/26/20 HIV : screening 2018 non-reactive  Colonoscopy:08/2019, Q5 years Pathology: adenomatous polyp  EGD: 2008 Eye Exam: 03/26/21 Dentist: 2019, due- needs root canal  Patient Care Team: Unk Pinto, MD as PCP - General (Internal Medicine) Armbruster, Carlota Raspberry, MD as Consulting Physician (Gastroenterology)  Medical History:  has Hyperlipidemia; Essential hypertension; GERD; Vitamin D deficiency; Other abnormal glucose (hx of prediabetes); Medication management; Overweight (BMI 25.0-29.9); and Gout on their problem list. Surgical History:  He  has a past surgical history that includes Back surgery (2012); Laparoscopic inguinal hernia with umbilical hernia (Right, 08/15/2013); Insertion of mesh (N/A, 08/15/2013); Hernia repair; Vitrectomy 25 gauge with scleral buckle (Right, 08/30/2020); Photocoagulation with laser (Right, 08/30/2020); Gas insertion (Right, 08/30/2020); Gas/fluid exchange (Right, 08/30/2020); and Perfluorone injection (Right, 08/30/2020). Family History:  His family history includes Colon cancer (age of onset: 63) in his brother; Diabetes in his mother;  Hypertension in his brother, brother, brother, and mother. Social History:   reports that he quit smoking about 19 years ago. His smoking use included cigarettes. He has never used smokeless tobacco. He reports that he does not drink alcohol and does not use drugs.   Review of Systems:  Review of Systems  Constitutional: Negative.  Negative for chills and fever.  HENT: Negative.  Negative for congestion, hearing loss, sinus pain, sore throat and tinnitus.   Eyes: Negative.  Negative for blurred vision and double vision.  Respiratory: Negative.  Negative for cough, hemoptysis, sputum production, shortness of breath and wheezing.   Cardiovascular: Negative.  Negative for chest pain, palpitations and leg swelling.  Gastrointestinal:  Positive for heartburn. Negative for abdominal pain, constipation, diarrhea, nausea and vomiting.  Genitourinary: Negative.  Negative for dysuria and urgency.  Musculoskeletal: Negative.  Negative for back pain, falls, joint pain, myalgias and neck pain.  Skin: Negative.  Negative for rash.       Swollen tender area on back  Neurological:  Negative for dizziness, tingling, tremors, weakness and headaches.  Endo/Heme/Allergies:  Does not bruise/bleed easily.  Psychiatric/Behavioral:  Negative for depression and suicidal ideas. The patient is not nervous/anxious and does not have insomnia.    Physical Exam: Estimated body mass index is 30.87 kg/m as calculated from the following:   Height as of this encounter: '5\' 8"'$  (1.727 m).   Weight as of this encounter: 203 lb (92.1 kg). BP (!) 144/82    Pulse (!) 55    Temp 97.9 F (36.6 C)    Ht '5\' 8"'$  (1.727 m)    Wt 203 lb (92.1 kg)    SpO2 97%    BMI 30.87 kg/m    General Appearance: Well nourished, in no apparent distress.  Eyes: PERRLA, EOMs, conjunctiva no swelling or erythema, normal fundi and vessels.  Sinuses: No Frontal/maxillary tenderness  ENT/Mouth: Ext aud canals clear, normal light reflex with TMs without  erythema, bulging. Good dentition. No erythema, swelling, or exudate on post pharynx. Tonsils not swollen or erythematous. Hearing normal.  Neck: Supple, thyroid normal. No bruits  Respiratory: Respiratory effort normal, BS equal bilaterally without rales, rhonchi, wheezing or stridor.  Cardio: RRR without murmurs, rubs or gallops. Brisk peripheral pulses without edema.  Chest: symmetric, with normal excursions and percussion.  Abdomen: Soft, nontender, no guarding, rebound, hernias, masses, or organomegaly.  Lymphatics: Non  tender without lymphadenopathy.  Genitourinary: deferred Musculoskeletal: Full ROM all peripheral extremities,5/5 strength, and normal gait.  Skin: Warm, dry . 2-3 cm epidermal cyst on upper back Neuro: Cranial nerves intact, reflexes equal bilaterally. Normal muscle tone, no cerebellar symptoms. Sensation intact.  Psych: Awake and oriented X 3, normal affect, Insight and Judgment appropriate.   Diagnosis: epidermal inclusion cyst - Location: back Procedure: Incision & drainage Informed consent:  Discussed risks (permanent loss of nail, permanent irregular growth of nail, infection, pain, bleeding, bruising, numbness, and recurrence of the condition) and benefits of the procedure, as well as the alternatives.  Informed consent was obtained. Type: partial The area was prepared and draped in a standard fashion. The lesion drained white, chalky, cyst material. The patient tolerated the procedure well. The patient was instructed on post-op care.   EKG:  Sinus bradycardia, No ST changes   Magdala Brahmbhatt Kathyrn Drown, NP 2:08 PM [facility]

## 2021-05-05 ENCOUNTER — Encounter: Payer: Self-pay | Admitting: Nurse Practitioner

## 2021-05-05 ENCOUNTER — Other Ambulatory Visit: Payer: Self-pay

## 2021-05-05 ENCOUNTER — Ambulatory Visit: Payer: BC Managed Care – PPO | Admitting: Nurse Practitioner

## 2021-05-05 VITALS — BP 144/82 | HR 55 | Temp 97.9°F | Ht 68.0 in | Wt 203.0 lb

## 2021-05-05 DIAGNOSIS — M1000 Idiopathic gout, unspecified site: Secondary | ICD-10-CM

## 2021-05-05 DIAGNOSIS — L089 Local infection of the skin and subcutaneous tissue, unspecified: Secondary | ICD-10-CM

## 2021-05-05 DIAGNOSIS — Z6830 Body mass index (BMI) 30.0-30.9, adult: Secondary | ICD-10-CM

## 2021-05-05 DIAGNOSIS — E6609 Other obesity due to excess calories: Secondary | ICD-10-CM

## 2021-05-05 DIAGNOSIS — E559 Vitamin D deficiency, unspecified: Secondary | ICD-10-CM | POA: Diagnosis not present

## 2021-05-05 DIAGNOSIS — N401 Enlarged prostate with lower urinary tract symptoms: Secondary | ICD-10-CM | POA: Diagnosis not present

## 2021-05-05 DIAGNOSIS — Z1389 Encounter for screening for other disorder: Secondary | ICD-10-CM

## 2021-05-05 DIAGNOSIS — Z125 Encounter for screening for malignant neoplasm of prostate: Secondary | ICD-10-CM

## 2021-05-05 DIAGNOSIS — Z131 Encounter for screening for diabetes mellitus: Secondary | ICD-10-CM | POA: Diagnosis not present

## 2021-05-05 DIAGNOSIS — Z13220 Encounter for screening for lipoid disorders: Secondary | ICD-10-CM | POA: Diagnosis not present

## 2021-05-05 DIAGNOSIS — L723 Sebaceous cyst: Secondary | ICD-10-CM

## 2021-05-05 DIAGNOSIS — Z79899 Other long term (current) drug therapy: Secondary | ICD-10-CM

## 2021-05-05 DIAGNOSIS — Z0000 Encounter for general adult medical examination without abnormal findings: Secondary | ICD-10-CM | POA: Diagnosis not present

## 2021-05-05 DIAGNOSIS — Z23 Encounter for immunization: Secondary | ICD-10-CM | POA: Diagnosis not present

## 2021-05-05 DIAGNOSIS — Z136 Encounter for screening for cardiovascular disorders: Secondary | ICD-10-CM

## 2021-05-05 DIAGNOSIS — E663 Overweight: Secondary | ICD-10-CM

## 2021-05-05 DIAGNOSIS — K219 Gastro-esophageal reflux disease without esophagitis: Secondary | ICD-10-CM

## 2021-05-05 DIAGNOSIS — R350 Frequency of micturition: Secondary | ICD-10-CM | POA: Diagnosis not present

## 2021-05-05 DIAGNOSIS — R7309 Other abnormal glucose: Secondary | ICD-10-CM

## 2021-05-05 DIAGNOSIS — E782 Mixed hyperlipidemia: Secondary | ICD-10-CM

## 2021-05-05 DIAGNOSIS — I10 Essential (primary) hypertension: Secondary | ICD-10-CM

## 2021-05-05 DIAGNOSIS — Z0001 Encounter for general adult medical examination with abnormal findings: Secondary | ICD-10-CM

## 2021-05-05 MED ORDER — CEPHALEXIN 500 MG PO CAPS: 500.0000 mg | ORAL_CAPSULE | Freq: Four times a day (QID) | ORAL | 0 refills | Status: DC

## 2021-05-05 NOTE — Patient Instructions (Signed)
Epidermoid Cyst ?An epidermoid cyst, also known as epidermal cyst, is a sac made of skin tissue. The sac contains a substance called keratin. Keratin is a protein that is normally secreted through the hair follicles. When keratin becomes trapped in the top layer of skin (epidermis), it can form an epidermoid cyst. ?Epidermoid cysts can be found anywhere on your body. These cysts are usually harmless (benign), and they may not cause symptoms unless they become inflamed or infected. ?What are the causes? ?This condition may be caused by: ?A blocked hair follicle. ?A hair that curls and re-enters the skin instead of growing straight out of the skin (ingrown hair). ?A blocked pore. ?Irritated skin. ?An injury to the skin. ?Certain conditions that are passed along from parent to child (inherited). ?Human papillomavirus (HPV). This happens rarely when cysts occur on the bottom of the feet. ?Long-term (chronic) sun damage to the skin. ?What increases the risk? ?The following factors may make you more likely to develop an epidermoid cyst: ?Having acne. ?Being male. ?Having an injury to the skin. ?Being past puberty. ?Having certain rare genetic disorders. ?What are the signs or symptoms? ?The only symptom of this condition may be a small, painless lump underneath the skin. When an epidermal cyst ruptures, it may become inflamed. True infection in cysts is rare. Symptoms may include: ?Redness. ?Inflammation. ?Tenderness. ?Warmth. ?Keratin draining from the cyst. Keratin is grayish-white, bad-smelling substance. ?Pus draining from the cyst. ?How is this diagnosed? ?This condition is diagnosed with a physical exam. ?In some cases, you may have a sample of tissue (biopsy) taken from your cyst to be examined under a microscope or tested for bacteria. ?You may be referred to a health care provider who specializes in skin care (dermatologist). ?How is this treated? ?If a cyst becomes inflamed, treatment may include: ?Opening and  draining the cyst, done by a health care provider. After draining, minor surgery to remove the rest of the cyst may be done. ?Taking antibiotic medicine. ?Having injections of medicines (steroids) that help to reduce inflammation. ?Having surgery to remove the cyst. Surgery may be done if the cyst: ?Becomes large. ?Bothers you. ?Has a chance of turning into cancer. ?Do not try to open a cyst yourself. ?Follow these instructions at home: ?Medicines ?If you were prescribed an antibiotic medicine, take it it as told by your health care provider. Do not stop using the antibiotic even if you start to feel better. ?Take over-the-counter and prescription medicines only as told by your health care provider. ?General instructions ?Keep the area around your cyst clean and dry. ?Wear loose, dry clothing. ?Avoid touching your cyst. ?Check your cyst every day for signs of infection. Check for: ?Redness, swelling, or pain. ?Fluid or blood. ?Warmth. ?Pus or a bad smell. ?Keep all follow-up visits. This is important. ?How is this prevented? ?Wear clean, dry, clothing. ?Avoid wearing tight clothing. ?Keep your skin clean and dry. Take showers or baths every day. ?Contact a health care provider if: ?Your cyst develops symptoms of infection. ?Your condition is not improving or is getting worse. ?You develop a cyst that looks different from other cysts you have had. ?You have a fever. ?Get help right away if: ?Redness spreads from the cyst into the surrounding area. ?Summary ?An epidermoid cyst is a sac made of skin tissue. These cysts are usually harmless (benign), and they may not cause symptoms unless they become inflamed. ?If a cyst becomes inflamed, treatment may include surgery to open and drain the cyst,  or to remove it. Treatment may also include medicines by mouth or through an injection. ?Take over-the-counter and prescription medicines only as told by your health care provider. If you were prescribed an antibiotic medicine,  take it as told by your health care provider. Do not stop using the antibiotic even if you start to feel better. ?Contact a health care provider if your condition is not improving or is getting worse. ?Keep all follow-up visits as told by your health care provider. This is important. ?This information is not intended to replace advice given to you by your health care provider. Make sure you discuss any questions you have with your health care provider. ?Document Revised: 05/17/2019 Document Reviewed: 05/17/2019 ?Elsevier Patient Education ? [employer]. ? ?

## 2021-05-06 LAB — COMPLETE METABOLIC PANEL WITH GFR
AG Ratio: 1.7 (calc) (ref 1.0–2.5)
ALT: 12 U/L (ref 9–46)
AST: 19 U/L (ref 10–35)
Albumin: 4.3 g/dL (ref 3.6–5.1)
Alkaline phosphatase (APISO): 62 U/L (ref 35–144)
BUN: 14 mg/dL (ref 7–25)
CO2: 26 mmol/L (ref 20–32)
Calcium: 9.6 mg/dL (ref 8.6–10.3)
Chloride: 104 mmol/L (ref 98–110)
Creat: 0.92 mg/dL (ref 0.70–1.35)
Globulin: 2.5 g/dL (calc) (ref 1.9–3.7)
Glucose, Bld: 113 mg/dL — ABNORMAL HIGH (ref 65–99)
Potassium: 3.5 mmol/L (ref 3.5–5.3)
Sodium: 141 mmol/L (ref 135–146)
Total Bilirubin: 0.7 mg/dL (ref 0.2–1.2)
Total Protein: 6.8 g/dL (ref 6.1–8.1)
eGFR: 92 mL/min/{1.73_m2} (ref >=60)

## 2021-05-06 LAB — LIPID PANEL
Cholesterol: 98 mg/dL (ref <200)
HDL: 38 mg/dL — ABNORMAL LOW (ref >=40)
LDL Cholesterol (Calc): 33 mg/dL (calc)
Non-HDL Cholesterol (Calc): 60 mg/dL (calc) (ref <130)
Total CHOL/HDL Ratio: 2.6 (calc) (ref <5.0)
Triglycerides: 208 mg/dL — ABNORMAL HIGH (ref <150)

## 2021-05-06 LAB — CBC WITH DIFFERENTIAL/PLATELET
Absolute Monocytes: 595 cells/uL (ref 200–950)
Basophils Absolute: 50 cells/uL (ref 0–200)
Basophils Relative: 0.8 %
Eosinophils Absolute: 291 cells/uL (ref 15–500)
Eosinophils Relative: 4.7 %
HCT: 42.8 % (ref 38.5–50.0)
Hemoglobin: 14.6 g/dL (ref 13.2–17.1)
Lymphs Abs: 2573 cells/uL (ref 850–3900)
MCH: 31.1 pg (ref 27.0–33.0)
MCHC: 34.1 g/dL (ref 32.0–36.0)
MCV: 91.3 fL (ref 80.0–100.0)
MPV: 11.4 fL (ref 7.5–12.5)
Monocytes Relative: 9.6 %
Neutro Abs: 2691 cells/uL (ref 1500–7800)
Neutrophils Relative %: 43.4 %
Platelets: 151 10*3/uL (ref 140–400)
RBC: 4.69 10*6/uL (ref 4.20–5.80)
RDW: 13.3 % (ref 11.0–15.0)
Total Lymphocyte: 41.5 %
WBC: 6.2 10*3/uL (ref 3.8–10.8)

## 2021-05-06 LAB — MAGNESIUM: Magnesium: 2 mg/dL (ref 1.5–2.5)

## 2021-05-06 LAB — URINALYSIS, ROUTINE W REFLEX MICROSCOPIC
Bilirubin Urine: NEGATIVE
Glucose, UA: NEGATIVE
Hgb urine dipstick: NEGATIVE
Ketones, ur: NEGATIVE
Leukocytes,Ua: NEGATIVE
Nitrite: NEGATIVE
Protein, ur: NEGATIVE
Specific Gravity, Urine: 1.005 (ref 1.001–1.035)
pH: 6.5 (ref 5.0–8.0)

## 2021-05-06 LAB — TSH: TSH: 1.41 mIU/L (ref 0.40–4.50)

## 2021-05-06 LAB — MICROALBUMIN / CREATININE URINE RATIO
Creatinine, Urine: 29 mg/dL (ref 20–320)
Microalb, Ur: <0.2 mg/dL

## 2021-05-06 LAB — VITAMIN D 25 HYDROXY (VIT D DEFICIENCY, FRACTURES): Vit D, 25-Hydroxy: 78 ng/mL (ref 30–100)

## 2021-05-06 LAB — PSA: PSA: 1.81 ng/mL (ref <=4.00)

## 2021-05-06 LAB — HEMOGLOBIN A1C
Hgb A1c MFr Bld: 5.7 % of total Hgb — ABNORMAL HIGH (ref <5.7)
Mean Plasma Glucose: 117 mg/dL
eAG (mmol/L): 6.5 mmol/L

## 2021-06-07 ENCOUNTER — Ambulatory Visit (HOSPITAL_COMMUNITY)
Admission: EM | Admit: 2021-06-07 | Discharge: 2021-06-07 | Disposition: A | Discharge status: Home / Self Care | Payer: BC Managed Care – PPO | Attending: Emergency Medicine | Admitting: Emergency Medicine

## 2021-06-07 ENCOUNTER — Encounter (HOSPITAL_COMMUNITY): Payer: Self-pay | Admitting: *Deleted

## 2021-06-07 ENCOUNTER — Other Ambulatory Visit: Payer: Self-pay

## 2021-06-07 DIAGNOSIS — M5441 Lumbago with sciatica, right side: Secondary | ICD-10-CM

## 2021-06-07 DIAGNOSIS — M62838 Other muscle spasm: Secondary | ICD-10-CM

## 2021-06-07 MED ORDER — IBUPROFEN 800 MG PO TABS: 800.0000 mg | ORAL_TABLET | Freq: Three times a day (TID) | ORAL | 0 refills | Status: DC

## 2021-06-07 MED ORDER — CYCLOBENZAPRINE HCL 10 MG PO TABS: 10.0000 mg | ORAL_TABLET | Freq: Two times a day (BID) | ORAL | 0 refills | Status: DC | PRN
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
## 2021-06-07 NOTE — Discharge Instructions (Signed)
Use muscle relaxant and anti-inflammatories as discussed add Biofreeze or IcyHot to sore muscles.  Follow-up with your primary care provider for physical therapy if not improving over the next week.  Return for any new or worsening symptoms for reevaluation. ?

## 2021-09-15 ENCOUNTER — Encounter: Payer: Self-pay | Admitting: Nurse Practitioner

## 2021-09-15 ENCOUNTER — Ambulatory Visit: Payer: BC Managed Care – PPO | Admitting: Nurse Practitioner

## 2021-09-15 ENCOUNTER — Ambulatory Visit
Admission: RE | Admit: 2021-09-15 | Discharge: 2021-09-15 | Disposition: A | Discharge status: Home / Self Care | Payer: BC Managed Care – PPO | Source: Ambulatory Visit | Attending: Nurse Practitioner | Admitting: Nurse Practitioner

## 2021-09-15 ENCOUNTER — Other Ambulatory Visit: Payer: Self-pay | Admitting: Nurse Practitioner

## 2021-09-15 VITALS — BP 138/78 | HR 60 | Temp 97.5°F | Ht 68.0 in | Wt 200.0 lb

## 2021-09-15 DIAGNOSIS — R0781 Pleurodynia: Secondary | ICD-10-CM

## 2021-09-15 DIAGNOSIS — S299XXA Unspecified injury of thorax, initial encounter: Secondary | ICD-10-CM | POA: Diagnosis not present

## 2021-09-15 DIAGNOSIS — J984 Other disorders of lung: Secondary | ICD-10-CM | POA: Diagnosis not present

## 2021-09-15 DIAGNOSIS — I10 Essential (primary) hypertension: Secondary | ICD-10-CM

## 2021-09-15 MED ORDER — CELECOXIB 100 MG PO CAPS: 100.0000 mg | ORAL_CAPSULE | Freq: Every day | ORAL | 0 refills | Status: DC

## 2021-09-15 NOTE — Progress Notes (Signed)
Assessment and Plan:  Caleb Johns was seen today for acute visit.  Diagnoses and all orders for this visit:  Rib pain on right side Use ice or heat Celebrex daily Flexeril as needed  F/U in 1 week If develops chest pain, worsening pain, shortness of breath notify the office -     celecoxib (CELEBREX) 100 MG capsule; Take 1 capsule (100 mg total) by mouth daily. -     DG Ribs Unilateral Left; Future   Essential Hypertension - continue medications, DASH diet, exercise and monitor at home. Call if greater than 130/80.      Further disposition pending results of labs. Discussed med's effects and SE's.   Over 30 minutes of exam, counseling, chart review, and critical decision making was performed.   Future Appointments  Date Time Provider [place]  11/05/2021  4:00 PM Alycia Rossetti, NP [facility] None  04/03/2022  2:30 PM Bernarda Caffey, MD [facility] None  05/06/2022  2:00 PM Alycia Rossetti, NP [facility] None    ------------------------------------------------------------------------------------------------------------------   HPI BP 138/78   Pulse 60   Temp (!) 97.5 F (36.4 C)   Ht '5\' 8"'$  (1.727 m)   Wt 200 lb (90.7 kg)   SpO2 95%   BMI 30.41 kg/m   66 y.o.male presents for 3 days ago flipped a zero turn mower and it fell on top of him.  May have potentially passed out,  He did wake up and was able to push the mower off of him. Now has pain in left side on ribs. Denies shortness of breath.   BP is currently well controlled with   HCTZ 25 mg  and Atenolol 100 mg QD  Denies headaches, chest pain, shortness of breath and dizziness.  BP Readings from Last 3 Encounters:  09/15/21 138/78  06/07/21 (!) 138/94  05/05/21 (!) 144/82    BMI is Body mass index is 30.41 kg/m., he has been working on diet and exercise. Wt Readings from Last 3 Encounters:  09/15/21 200 lb (90.7 kg)  05/05/21 203 lb (92.1 kg)  03/26/21 202 lb 6.4 oz (91.8 kg)     Past Medical History:   Diagnosis Date   GERD (gastroesophageal reflux disease)    Gout    HSV-1 (herpes simplex virus 1) infection    HSV-2 (herpes simplex virus 2) infection    Hyperlipidemia    Hypertension    Hypogonadism male    Obese    Pneumonia    walking -many years ago   Pre-diabetes      Allergies  Allergen Reactions   Ace Inhibitors     REACTION: cough   Trilipix [Choline Fenofibrate] Other (See Comments)    Heart burn    Current Outpatient Medications on File Prior to Visit  Medication Sig   acyclovir (ZOVIRAX) 800 MG tablet TAKE 1 TABLET BY MOUTH DAILY TO PREVENT FEVER BLISTERS AND COLD SORES.   allopurinol (ZYLOPRIM) 300 MG tablet Take 1 tablet Daily to Prevent Gout   alum & mag hydroxide-simeth (MAALOX/MYLANTA) 200-200-20 MG/5ML suspension Take 30 mLs by mouth 2 (two) times daily.   Ascorbic Acid (VITAMIN C) 1000 MG tablet Take 1,000 mg by mouth daily.   aspirin EC 81 MG tablet Take 81 mg by mouth daily.   atenolol (TENORMIN) 100 MG tablet Take 1 tablet  Daily for BP /  Patient knows to take by mouth   B Complex-C (SUPER B COMPLEX PO) Take 1 tablet by mouth daily.   cephALEXin (KEFLEX)  500 MG capsule Take 1 capsule (500 mg total) by mouth 4 (four) times daily. For 10 days.  Take with food.   Cholecalciferol (VITAMIN D) 2000 UNITS CAPS Take 4,000 Units by mouth daily.   cyclobenzaprine (FLEXERIL) 10 MG tablet Take 1 tablet (10 mg total) by mouth 2 (two) times daily as needed for muscle spasms.   famotidine (PEPCID) 40 MG tablet Take 1 tablet (40 mg total) by mouth every evening.   Flaxseed, Linseed, (FLAX SEED OIL) 1000 MG CAPS Take 1,000 mg by mouth daily.   fluticasone (FLONASE) 50 MCG/ACT nasal spray Place 2 sprays into both nostrils daily.   gabapentin (NEURONTIN) 300 MG capsule Take 1 capsule (300 mg total) by mouth 2 (two) times daily as needed.   hydrochlorothiazide (HYDRODIURIL) 25 MG tablet Take  1 tablet  Daily  for BP & Fluid Retention /Ankle Swelling - Patient knows to take  by mouth   ibuprofen (ADVIL) 800 MG tablet Take 1 tablet (800 mg total) by mouth 3 (three) times daily.   loratadine (EQ LORATADINE) 10 MG tablet TAKE 1 TABLET BY MOUTH DAILY FOR ALLERGIES   losartan (COZAAR) 50 MG tablet Take 1 tablet (50 mg total) by mouth daily.   Omega-3 Fatty Acids (FISH OIL) 1000 MG CAPS Take 1,000 mg by mouth daily.   rosuvastatin (CRESTOR) 20 MG tablet Take 1 tablet (20 mg total) by mouth daily.   sildenafil (VIAGRA) 100 MG tablet Take 1/2 to 1 tablet /day  ONLY if needed for XXXX   tamsulosin (FLOMAX) 0.4 MG CAPS capsule Take 1 capsule (0.4 mg total) by mouth daily.   No current facility-administered medications on file prior to visit.    ROS: all negative except above.   Physical Exam:  BP 138/78   Pulse 60   Temp (!) 97.5 F (36.4 C)   Ht '5\' 8"'$  (1.727 m)   Wt 200 lb (90.7 kg)   SpO2 95%   BMI 30.41 kg/m   General Appearance: Well nourished, in no apparent distress. Eyes: PERRLA, EOMs, conjunctiva no swelling or erythema Sinuses: No Frontal/maxillary tenderness ENT/Mouth: Ext aud canals clear, TMs without erythema, bulging. No erythema, swelling, or exudate on post pharynx.  Tonsils not swollen or erythematous. Hearing normal.  Neck: Supple, thyroid normal.  Respiratory: Respiratory effort normal, BS equal bilaterally without rales, rhonchi, wheezing or stridor.  Cardio: RRR with no MRGs. Brisk peripheral pulses without edema.  Abdomen: Soft, + BS.  Non tender, no guarding, rebound, hernias, masses. Lymphatics: Non tender without lymphadenopathy.  Musculoskeletal: Full ROM, 5/5 strength, antalgic gait. Pain with pressure on left flank over ribs Skin: Warm, dry without rashes, lesions, ecchymosis.  Neuro: Cranial nerves intact. Normal muscle tone, no cerebellar symptoms. Sensation intact.  Psych: Awake and oriented X 3, normal affect, Insight and Judgment appropriate.     Alycia Rossetti, NP 1:59 PM [facility]

## 2021-09-15 NOTE — Patient Instructions (Signed)
[facility] [place]  Walk In M-F 8:30-3:45 Celebrex daily, avoid Ibuprofen, can take tylenol as needed  Rib Fracture  A rib fracture is a break or crack in one of the bones of the ribs. The ribs are long, curved bones that wrap around your chest and attach to your spine and your breastbone. The ribs protect your heart, lungs, and other organs in the chest. A broken or cracked rib is often painful but is not usually serious. Most rib fractures heal on their own over time. However, rib fractures can be more serious if multiple ribs are broken or if broken ribs move out of place and push against other structures or organs. What are the causes? This condition is caused by: Repetitive movements with high force, such as pitching a baseball or having very bad coughing spells. A direct hit the chest, such as a sports injury, a car crash, or a fall. Cancer that has spread to the bones, which can weaken bones and cause them to break. What are the signs or symptoms? Symptoms of this condition include: Pain when you breathe in or cough. Pain when someone presses on the injured area. Feeling short of breath. How is this diagnosed? This condition is diagnosed with a physical exam and medical history. You may also have imaging tests, such as: Chest X-ray. CT scan. MRI. Bone scan. Chest ultrasound. How is this treated? Treatment for this condition depends on the severity of the fracture. Most rib fractures usually heal on their own in 1-3 months. Healing may take longer if you have a cough or if you do activities that make the injury worse. While you heal, you may be given medicines to control the pain. You will also be taught deep breathing exercises. Severe injuries may require hospitalization or surgery. Follow these instructions at home: Managing pain, stiffness, and swelling If directed, put ice on the injured area. To do this: Put ice in a plastic  bag. Place a towel between your skin and the bag. Leave the ice on for 20 minutes, 2-3 times a day. Remove the ice if your skin turns bright red. This is very important. If you cannot feel pain, heat, or cold, you have a greater risk of damage to the area. Take over-the-counter and prescription medicines only as told by your health care provider. Activity Avoid doing activities or movements that cause pain. Be careful during activities and avoid bumping the injured rib. Slowly increase your activity as told by your health care provider. General instructions Do deep breathing exercises as told by your health care provider. This helps prevent pneumonia, which is a common complication of a broken rib. Your health care provider may instruct you to: Take deep breaths several times a day. Try to cough several times a day, holding a pillow against the injured area. Use a device called incentive spirometer to practice deep breathing several times a day. Drink enough fluid to keep your urine pale yellow. Do not wear a rib belt or binder. These restrict breathing, which can lead to pneumonia. Keep all follow-up visits. This is important. Contact a health care provider if: You have a fever. Get help right away if: You have difficulty breathing or you are short of breath. You develop a cough that does not stop, or you cough up thick or bloody sputum. You have nausea, vomiting, or pain in your abdomen. Your pain gets worse and medicine does not help. These symptoms may represent a  serious problem that is an emergency. Do not wait to see if the symptoms will go away. Get medical help right away. Call your local emergency services (911 in the [place] Do not drive yourself to the hospital. Summary A rib fracture is a break or crack in one of the bones of the ribs. A broken or cracked rib is often painful but is not usually serious. Most rib fractures heal on their own over time. Treatment for this  condition depends on the severity of the fracture. Avoid doing any activities or movements that cause pain. This information is not intended to replace advice given to you by your health care provider. Make sure you discuss any questions you have with your health care provider. Document Revised: 06/02/2019 Document Reviewed: 06/02/2019 Elsevier Patient Education  [place].

## 2021-09-16 ENCOUNTER — Telehealth: Payer: Self-pay | Admitting: Nurse Practitioner

## 2021-09-16 NOTE — Telephone Encounter (Signed)
Pt says the celebrix isn't working for him, doesn't feel any different than him taking an aspirin. Wanting to know if something else can be sent in

## 2021-09-16 NOTE — Telephone Encounter (Signed)
You can do Ibuprofen 600 mg every 6 hours and flexeril .  Continue ice and rest. No fractures

## 2021-09-18 ENCOUNTER — Telehealth: Payer: Self-pay | Admitting: Nurse Practitioner

## 2021-09-18 ENCOUNTER — Other Ambulatory Visit: Payer: Self-pay | Admitting: Nurse Practitioner

## 2021-09-18 DIAGNOSIS — R0781 Pleurodynia: Secondary | ICD-10-CM

## 2021-09-18 MED ORDER — TRAMADOL HCL 50 MG PO TABS: 50.0000 mg | ORAL_TABLET | Freq: Four times a day (QID) | ORAL | 0 refills | Status: DC | PRN

## 2021-09-18 NOTE — Telephone Encounter (Signed)
Pt says he has tried to take Tylenol with the Flexeril but he says it doesn't help the pain much. Wanting to know what his other options are

## 2021-09-18 NOTE — Telephone Encounter (Signed)
I will give you a very short course of Tramadol.  You can take 1 every 6 hours as needed.  If this is not helping pain I would like to refer you to orthopedics

## 2021-09-20 NOTE — Progress Notes (Unsigned)
Assessment and Plan:  There are no diagnoses linked to this encounter.    Further disposition pending results of labs. Discussed med's effects and SE's.   Over 30 minutes of exam, counseling, chart review, and critical decision making was performed.   Future Appointments  Date Time Provider [place]  09/22/2021 10:15 AM Alycia Rossetti, NP [facility] None  11/05/2021  4:00 PM Alycia Rossetti, NP [facility] None  04/03/2022  2:30 PM Bernarda Caffey, MD [facility] None  05/06/2022  2:00 PM Alycia Rossetti, NP [facility] None    ------------------------------------------------------------------------------------------------------------------   HPI There were no vitals taken for this visit. 66 y.o.male presents for  Past Medical History:  Diagnosis Date   GERD (gastroesophageal reflux disease)    Gout    HSV-1 (herpes simplex virus 1) infection    HSV-2 (herpes simplex virus 2) infection    Hyperlipidemia    Hypertension    Hypogonadism male    Obese    Pneumonia    walking -many years ago   Pre-diabetes      Allergies  Allergen Reactions   Ace Inhibitors     REACTION: cough   Trilipix [Choline Fenofibrate] Other (See Comments)    Heart burn    Current Outpatient Medications on File Prior to Visit  Medication Sig   acyclovir (ZOVIRAX) 800 MG tablet TAKE 1 TABLET BY MOUTH DAILY TO PREVENT FEVER BLISTERS AND COLD SORES.   allopurinol (ZYLOPRIM) 300 MG tablet Take 1 tablet Daily to Prevent Gout   alum & mag hydroxide-simeth (MAALOX/MYLANTA) 200-200-20 MG/5ML suspension Take 30 mLs by mouth 2 (two) times daily.   Ascorbic Acid (VITAMIN C) 1000 MG tablet Take 1,000 mg by mouth daily.   aspirin EC 81 MG tablet Take 81 mg by mouth daily.   atenolol (TENORMIN) 100 MG tablet Take 1 tablet  Daily for BP /  Patient knows to take by mouth   B Complex-C (SUPER B COMPLEX PO) Take 1 tablet by mouth daily.   celecoxib (CELEBREX) 100 MG capsule Take 1 capsule (100 mg total) by  mouth daily.   Cholecalciferol (VITAMIN D) 2000 UNITS CAPS Take 4,000 Units by mouth daily.   cyclobenzaprine (FLEXERIL) 10 MG tablet Take 1 tablet (10 mg total) by mouth 2 (two) times daily as needed for muscle spasms.   famotidine (PEPCID) 40 MG tablet Take 1 tablet (40 mg total) by mouth every evening.   Flaxseed, Linseed, (FLAX SEED OIL) 1000 MG CAPS Take 1,000 mg by mouth daily.   fluticasone (FLONASE) 50 MCG/ACT nasal spray Place 2 sprays into both nostrils daily.   gabapentin (NEURONTIN) 300 MG capsule Take 1 capsule (300 mg total) by mouth 2 (two) times daily as needed.   hydrochlorothiazide (HYDRODIURIL) 25 MG tablet Take  1 tablet  Daily  for BP & Fluid Retention /Ankle Swelling - Patient knows to take by mouth   loratadine (EQ LORATADINE) 10 MG tablet TAKE 1 TABLET BY MOUTH DAILY FOR ALLERGIES   Omega-3 Fatty Acids (FISH OIL) 1000 MG CAPS Take 1,000 mg by mouth daily.   rosuvastatin (CRESTOR) 20 MG tablet Take 1 tablet (20 mg total) by mouth daily.   sildenafil (VIAGRA) 100 MG tablet Take 1/2 to 1 tablet /day  ONLY if needed for XXXX (Patient not taking: Reported on 09/15/2021)   tamsulosin (FLOMAX) 0.4 MG CAPS capsule Take 1 capsule (0.4 mg total) by mouth daily. (Patient not taking: Reported on 09/15/2021)   traMADol (ULTRAM) 50 MG tablet Take 1 tablet (  50 mg total) by mouth every 6 (six) hours as needed.   No current facility-administered medications on file prior to visit.    ROS: all negative except above.   Physical Exam:  There were no vitals taken for this visit.  General Appearance: Well nourished, in no apparent distress. Eyes: PERRLA, EOMs, conjunctiva no swelling or erythema Sinuses: No Frontal/maxillary tenderness ENT/Mouth: Ext aud canals clear, TMs without erythema, bulging. No erythema, swelling, or exudate on post pharynx.  Tonsils not swollen or erythematous. Hearing normal.  Neck: Supple, thyroid normal.  Respiratory: Respiratory effort normal, BS equal  bilaterally without rales, rhonchi, wheezing or stridor.  Cardio: RRR with no MRGs. Brisk peripheral pulses without edema.  Abdomen: Soft, + BS.  Non tender, no guarding, rebound, hernias, masses. Lymphatics: Non tender without lymphadenopathy.  Musculoskeletal: Full ROM, 5/5 strength, normal gait.  Skin: Warm, dry without rashes, lesions, ecchymosis.  Neuro: Cranial nerves intact. Normal muscle tone, no cerebellar symptoms. Sensation intact.  Psych: Awake and oriented X 3, normal affect, Insight and Judgment appropriate.     Alycia Rossetti, NP 12:04 PM [facility] [place] [facility]

## 2021-09-22 ENCOUNTER — Ambulatory Visit: Payer: BC Managed Care – PPO | Admitting: Nurse Practitioner

## 2021-09-22 ENCOUNTER — Encounter: Payer: Self-pay | Admitting: Nurse Practitioner

## 2021-09-22 VITALS — BP 142/84 | HR 51 | Temp 97.3°F | Ht 68.0 in | Wt 193.0 lb

## 2021-09-22 DIAGNOSIS — R0781 Pleurodynia: Secondary | ICD-10-CM

## 2021-09-22 DIAGNOSIS — I10 Essential (primary) hypertension: Secondary | ICD-10-CM | POA: Diagnosis not present

## 2021-09-24 DIAGNOSIS — H0288A Meibomian gland dysfunction right eye, upper and lower eyelids: Secondary | ICD-10-CM | POA: Diagnosis not present

## 2021-09-24 DIAGNOSIS — H33021 Retinal detachment with multiple breaks, right eye: Secondary | ICD-10-CM | POA: Diagnosis not present

## 2021-09-24 DIAGNOSIS — E119 Type 2 diabetes mellitus without complications: Secondary | ICD-10-CM | POA: Diagnosis not present

## 2021-09-24 DIAGNOSIS — H04123 Dry eye syndrome of bilateral lacrimal glands: Secondary | ICD-10-CM | POA: Diagnosis not present

## 2021-10-09 ENCOUNTER — Other Ambulatory Visit: Payer: Self-pay | Admitting: Nurse Practitioner

## 2021-10-09 DIAGNOSIS — R0781 Pleurodynia: Secondary | ICD-10-CM

## 2021-10-24 ENCOUNTER — Other Ambulatory Visit: Payer: Self-pay | Admitting: Nurse Practitioner

## 2021-10-24 DIAGNOSIS — B009 Herpesviral infection, unspecified: Secondary | ICD-10-CM

## 2021-11-04 NOTE — Progress Notes (Deleted)
 FOLLOW UP 3 MONTH  Assessment and Plan:   Vane was seen today for follow-up.  Diagnoses and all orders for this visit:  Essential hypertension Continue current medications: Atenolol 100 mg 1/2 tab QD, HCTZ '25mg'$  QD Add Losartan 50 mg QD Monitor blood pressure at home; call if consistently over 130/80 Continue DASH diet.   Follow up in 2 weeks Reminder to go to the ER if any CP, SOB, nausea, dizziness, severe HA, changes vision/speech, left arm numbness and tingling and jaw pain. -     COMPLETE METABOLIC PANEL WITH GFR -     CBC with Differential/Platelet  Hyperlipidemia Continue medications: Pravastatin 20 mg QD Discussed dietary and exercise modifications Low fat diet -     Lipid panel  Other abnormal glucose (hx of prediabetes) Discussed dietary and exercise modifications  Idiopathic chronic gout without tophus, unspecified site Continue allopurinol '300mg'$  daily No recent flares Discussed dietary modifications Continue to monitor  Vitamin D deficiency Continue supplementation to maintain goal of 60-100 Taking Vitamin D 4,000 IU daily  Gastroesophageal reflux disease, unspecified whether esophagitis present Doing well at this time Continue with benefit Diet discussed Monitor for triggers Avoid food with high acid content Avoid excessive cafeine Increase water intake -     famotidine (PEPCID) 40 MG tablet; Take 1 tablet (40 mg total) by mouth every evening.  Benign prostatic hyperplasia with lower urinary tract symptoms PSA- previously elevated at work if continues to be elevated needs referral to urologist Tamsulosin 0.4 mg QD  Overweight (BMI 25.0-29.9) Discussed dietary and exercise modifications  Medication management Continued    Discussed med's effects and SE's. Screening labs and tests as requested with regular follow-up as recommended. Over 30 minutes of face to face exam, counseling, chart review and critical decision making was performed Future  Appointments  Date Time Provider [place]  11/05/2021  4:00 PM Alycia Rossetti, NP [facility] None  04/03/2022  2:30 PM Bernarda Caffey, MD [facility] None  05/06/2022  2:00 PM Alycia Rossetti, NP [facility] None     HPI 66 yo male patient presents for 3 month follow up for HTN, HLD, GERD, gout, Vitamin D deficiency, and weight.  Yearly he has labs and exam at work.  On exam yesterday his BP was elevated to 152/100- heart rate 48-49. He has been noticing a pain in left side of chest which he describes as a fullness.  Started today, nothing makes it worse or better.  PSA was tested at his work 12/17/20 and value was 9.8.  He can start stream but does not feel like he empties his bladder.  He gets up 4-5 times a night to urinate   His blood pressure has been controlled at home, today their BP is    BP Readings from Last 3 Encounters:  09/22/21 (!) 142/84  09/15/21 138/78  06/07/21 (!) 138/94     He is taking Famotidine for GERD.  Reports this is working well for him.   He denies chest pain, shortness of breath, dizziness.  He is on cholesterol medication, pravachol '40mg'$  half tablet daily and denies myalgias. His cholesterol is not at goal. The cholesterol last visit was:   Lab Results  Component Value Date   CHOL 98 05/05/2021   HDL 38 (L) 05/05/2021   LDLCALC 33 05/05/2021   TRIG 208 (H) 05/05/2021   CHOLHDL 2.6 05/05/2021   He has not been working on diet and exercise for prediabetes, he is on bASA, he is not  on ACE/ARB and denies hyperglycemia, hypoglycemia , nausea, polydipsia and polyuria. Last A1C in the office was:  Lab Results  Component Value Date   HGBA1C 5.7 (H) 05/05/2021   Last GFR:  Lab Results  Component Value Date   GFRAA 108 05/02/2020   Patient is on Vitamin D supplement.   Lab Results  Component Value Date   VD25OH 78 05/05/2021     Last PSA was: Lab Results  Component Value Date   PSA 1.81 05/05/2021    Current Medications:  Current  Outpatient Medications on File Prior to Visit  Medication Sig Dispense Refill   acyclovir (ZOVIRAX) 800 MG tablet TAKE 1 TABLET BY MOUTH ONCE DAILY TO  PREVENT  FEVER  BLISTERS  AND  COLD  SORES 90 tablet 0   allopurinol (ZYLOPRIM) 300 MG tablet Take 1 tablet Daily to Prevent Gout 90 tablet 1   alum & mag hydroxide-simeth (MAALOX/MYLANTA) 200-200-20 MG/5ML suspension Take 30 mLs by mouth 2 (two) times daily.     Ascorbic Acid (VITAMIN C) 1000 MG tablet Take 1,000 mg by mouth daily.     aspirin EC 81 MG tablet Take 81 mg by mouth daily.     atenolol (TENORMIN) 100 MG tablet Take 1 tablet  Daily for BP /  Patient knows to take by mouth 90 tablet 3   B Complex-C (SUPER B COMPLEX PO) Take 1 tablet by mouth daily.     celecoxib (CELEBREX) 100 MG capsule Take 1 capsule by mouth once daily 30 capsule 0   Cholecalciferol (VITAMIN D) 2000 UNITS CAPS Take 4,000 Units by mouth daily.     cyclobenzaprine (FLEXERIL) 10 MG tablet Take 1 tablet (10 mg total) by mouth 2 (two) times daily as needed for muscle spasms. 20 tablet 0   famotidine (PEPCID) 40 MG tablet Take 1 tablet (40 mg total) by mouth every evening. 90 tablet 2   Flaxseed, Linseed, (FLAX SEED OIL) 1000 MG CAPS Take 1,000 mg by mouth daily.     fluticasone (FLONASE) 50 MCG/ACT nasal spray Place 2 sprays into both nostrils daily. 48 g 3   gabapentin (NEURONTIN) 300 MG capsule Take 1 capsule (300 mg total) by mouth 2 (two) times daily as needed. 180 capsule 2   hydrochlorothiazide (HYDRODIURIL) 25 MG tablet Take  1 tablet  Daily  for BP & Fluid Retention /Ankle Swelling - Patient knows to take by mouth 90 tablet 3   loratadine (EQ LORATADINE) 10 MG tablet TAKE 1 TABLET BY MOUTH DAILY FOR ALLERGIES 90 tablet 0   Omega-3 Fatty Acids (FISH OIL) 1000 MG CAPS Take 1,000 mg by mouth daily.     rosuvastatin (CRESTOR) 20 MG tablet Take 1 tablet (20 mg total) by mouth daily. 30 tablet 11   sildenafil (VIAGRA) 100 MG tablet Take 1/2 to 1 tablet /day  ONLY if  needed for XXXX 30 tablet 0   tamsulosin (FLOMAX) 0.4 MG CAPS capsule Take 1 capsule (0.4 mg total) by mouth daily. 30 capsule 3   traMADol (ULTRAM) 50 MG tablet Take 1 tablet (50 mg total) by mouth every 6 (six) hours as needed. 10 tablet 0   No current facility-administered medications on file prior to visit.   Allergies:  Allergies  Allergen Reactions   Ace Inhibitors     REACTION: cough   Trilipix [Choline Fenofibrate] Other (See Comments)    Heart burn   Health Maintenance:  Immunization History  Administered Date(s) Administered   PNEUMOCOCCAL CONJUGATE-20 05/05/2021  PPD Test 02/13/2014   Pneumococcal Polysaccharide-23 02/23/2002   Tdap 05/15/2009, 05/02/2020   Tetanus: 2011 will wait until after COVID vaccine Pneumovax: 2004 N/A Prevnar 13: N/A Flu vaccine: 2020, at work Zostavax: Due dicussed   HIV : screening 2018 non-reactive  Colonoscopy: 2018, Due 2021 Polyps EGD: 2008 Eye Exam: 2019, Due for 2020 Dentist: 2019, Due for 2020  Patient Care Team: Unk Pinto, MD as PCP - General (Internal Medicine) Armbruster, Carlota Raspberry, MD as Consulting Physician (Gastroenterology)  Medical History:  has Hyperlipidemia; Essential hypertension; GERD; Vitamin D deficiency; Other abnormal glucose (hx of prediabetes); Medication management; Overweight (BMI 25.0-29.9); and Gout on their problem list. Surgical History:  He  has a past surgical history that includes Back surgery (2012); Laparoscopic inguinal hernia with umbilical hernia (Right, 08/15/2013); Insertion of mesh (N/A, 08/15/2013); Hernia repair; Vitrectomy 25 gauge with scleral buckle (Right, 08/30/2020); Photocoagulation with laser (Right, 08/30/2020); Gas insertion (Right, 08/30/2020); Gas/fluid exchange (Right, 08/30/2020); and Perfluorone injection (Right, 08/30/2020). Family History:  His family history includes Colon cancer (age of onset: 27) in his brother; Diabetes in his mother; Hypertension in his brother, brother,  brother, and mother. Social History:   reports that he quit smoking about 19 years ago. His smoking use included cigarettes. He has never used smokeless tobacco. He reports that he does not drink alcohol and does not use drugs. Review of Systems:  Review of Systems  Constitutional: Negative.  Negative for chills, fever and weight loss.  HENT: Negative.  Negative for congestion and hearing loss.   Eyes: Negative.  Negative for blurred vision and double vision.  Respiratory: Negative.  Negative for cough and shortness of breath.   Cardiovascular:  Positive for chest pain (fullness in left lower chest). Negative for palpitations, orthopnea and leg swelling.  Gastrointestinal: Negative.  Negative for abdominal pain, constipation, diarrhea, heartburn, nausea and vomiting.  Genitourinary: Negative.   Musculoskeletal: Negative.  Negative for falls, joint pain and myalgias.  Skin: Negative.  Negative for rash.  Neurological:  Negative for dizziness, tingling, tremors, loss of consciousness and headaches.  Psychiatric/Behavioral:  Negative for depression, memory loss and suicidal ideas.     Physical Exam: Estimated body mass index is 29.35 kg/m as calculated from the following:   Height as of 09/22/21: '5\' 8"'$  (1.727 m).   Weight as of 09/22/21: 193 lb (87.5 kg). There were no vitals taken for this visit. General Appearance: Well nourished, in no apparent distress.  Eyes: PERRLA, EOMs, conjunctiva no swelling or erythema, normal fundi and vessels.  Sinuses: No Frontal/maxillary tenderness  ENT/Mouth: Ext aud canals clear, normal light reflex with TMs without erythema, bulging. Good dentition. No erythema, swelling, or exudate on post pharynx. Tonsils not swollen or erythematous. Hearing normal.  Neck: Supple, thyroid normal. No bruits  Respiratory: Respiratory effort normal, BS equal bilaterally without rales, rhonchi, wheezing or stridor.  Cardio: RRR without murmurs, rubs or gallops. Brisk  peripheral pulses without edema.  Chest: symmetric, with normal excursions and percussion.  Abdomen: Soft, nontender, no guarding, rebound, hernias, masses, or organomegaly.  Lymphatics: Non tender without lymphadenopathy.  Musculoskeletal: Full ROM all peripheral extremities,5/5 strength, and normal gait.  Skin: Warm, dry without rashes, lesions, ecchymosis. Neuro: Cranial nerves intact, reflexes equal bilaterally. Normal muscle tone, no cerebellar symptoms. Sensation intact.  Psych: Awake and oriented X 3, normal affect, Insight and Judgment appropriate.  EKG: Sinus bradycardia, no ST changes  Naila Elizondo E  5:45 PM [facility]

## 2021-11-05 ENCOUNTER — Ambulatory Visit: Payer: BC Managed Care – PPO | Admitting: Nurse Practitioner

## 2021-11-05 DIAGNOSIS — E663 Overweight: Secondary | ICD-10-CM

## 2021-11-05 DIAGNOSIS — Z79899 Other long term (current) drug therapy: Secondary | ICD-10-CM

## 2021-11-05 DIAGNOSIS — I10 Essential (primary) hypertension: Secondary | ICD-10-CM

## 2021-11-05 DIAGNOSIS — R7309 Other abnormal glucose: Secondary | ICD-10-CM

## 2021-11-05 DIAGNOSIS — E782 Mixed hyperlipidemia: Secondary | ICD-10-CM

## 2021-11-05 DIAGNOSIS — M1000 Idiopathic gout, unspecified site: Secondary | ICD-10-CM

## 2021-11-05 DIAGNOSIS — K219 Gastro-esophageal reflux disease without esophagitis: Secondary | ICD-10-CM

## 2021-11-05 DIAGNOSIS — N401 Enlarged prostate with lower urinary tract symptoms: Secondary | ICD-10-CM

## 2021-11-05 DIAGNOSIS — E559 Vitamin D deficiency, unspecified: Secondary | ICD-10-CM

## 2021-11-12 NOTE — Progress Notes (Unsigned)
 FOLLOW UP 3 MONTH  Assessment and Plan:   Caleb Johns was seen today for follow-up.  Diagnoses and all orders for this visit:  Essential hypertension Continue current medications: Atenolol 100 mg 1/2 tab QD, HCTZ '25mg'$  QD Add Losartan 50 mg QD Monitor blood pressure at home; call if consistently over 130/80 Continue DASH diet.   Follow up in 2 weeks Reminder to go to the ER if any CP, SOB, nausea, dizziness, severe HA, changes vision/speech, left arm numbness and tingling and jaw pain. -     COMPLETE METABOLIC PANEL WITH GFR -     CBC with Differential/Platelet  Hyperlipidemia Continue medications: Pravastatin 20 mg QD Discussed dietary and exercise modifications Low fat diet -     Lipid panel  Other abnormal glucose (hx of prediabetes) Discussed dietary and exercise modifications -A1c  Idiopathic chronic gout without tophus, unspecified site Continue allopurinol '300mg'$  daily No recent flares Discussed dietary modifications Continue to monitor - uric acid  Vitamin D deficiency Continue supplementation to maintain goal of 60-100 Taking Vitamin D 4,000 IU daily  Gastroesophageal reflux disease, unspecified whether esophagitis present Doing well at this time Continue with benefit Diet discussed Monitor for triggers Avoid food with high acid content Avoid excessive cafeine Increase water intake -     famotidine (PEPCID) 40 MG tablet; Take 1 tablet (40 mg total) by mouth every evening.  Benign prostatic hyperplasia with lower urinary tract symptoms PSA- previously elevated at work if continues to be elevated needs referral to urologist Tamsulosin 0.4 mg QD  Overweight (BMI 25.0-29.9) Discussed dietary and exercise modifications  Medication management Continued    Discussed med's effects and SE's. Screening labs and tests as requested with regular follow-up as recommended. Over 30 minutes of face to face exam, counseling, chart review and critical decision making was  performed Future Appointments  Date Time Provider [place]  11/13/2021  4:15 PM Alycia Rossetti, NP [facility] None  04/03/2022  2:30 PM Bernarda Caffey, MD [facility] None  05/06/2022  2:00 PM Alycia Rossetti, NP [facility] None     HPI 66 yo male patient presents for 3 month follow up for HTN, HLD, GERD, gout, Vitamin D deficiency, and weight.  Yearly he has labs and exam at work.  On exam yesterday his BP was elevated to 152/100- heart rate 48-49. He has been noticing a pain in left side of chest which he describes as a fullness.  Started today, nothing makes it worse or better.  PSA was tested at his work 12/17/20 and value was 9.8.  He can start stream but does not feel like he empties his bladder.  He gets up 4-5 times a night to urinate   His blood pressure has been controlled at home, today their BP is    BP Readings from Last 3 Encounters:  09/22/21 (!) 142/84  09/15/21 138/78  06/07/21 (!) 138/94     He is taking Famotidine for GERD.  Reports this is working well for him.   He denies chest pain, shortness of breath, dizziness.  He is on cholesterol medication, pravachol '40mg'$  half tablet daily and denies myalgias. His cholesterol is not at goal. The cholesterol last visit was:   Lab Results  Component Value Date   CHOL 98 05/05/2021   HDL 38 (L) 05/05/2021   LDLCALC 33 05/05/2021   TRIG 208 (H) 05/05/2021   CHOLHDL 2.6 05/05/2021   He has not been working on diet and exercise for prediabetes, he is on  bASA, he is not on ACE/ARB and denies hyperglycemia, hypoglycemia , nausea, polydipsia and polyuria. Last A1C in the office was:  Lab Results  Component Value Date   HGBA1C 5.7 (H) 05/05/2021   Last GFR:  Lab Results  Component Value Date   GFRAA 108 05/02/2020   Patient is on Vitamin D supplement.   Lab Results  Component Value Date   VD25OH 78 05/05/2021     Last PSA was: Lab Results  Component Value Date   PSA 1.81 05/05/2021    Current  Medications:  Current Outpatient Medications on File Prior to Visit  Medication Sig Dispense Refill   acyclovir (ZOVIRAX) 800 MG tablet TAKE 1 TABLET BY MOUTH ONCE DAILY TO  PREVENT  FEVER  BLISTERS  AND  COLD  SORES 90 tablet 0   allopurinol (ZYLOPRIM) 300 MG tablet Take 1 tablet Daily to Prevent Gout 90 tablet 1   alum & mag hydroxide-simeth (MAALOX/MYLANTA) 200-200-20 MG/5ML suspension Take 30 mLs by mouth 2 (two) times daily.     Ascorbic Acid (VITAMIN C) 1000 MG tablet Take 1,000 mg by mouth daily.     aspirin EC 81 MG tablet Take 81 mg by mouth daily.     atenolol (TENORMIN) 100 MG tablet Take 1 tablet  Daily for BP /  Patient knows to take by mouth 90 tablet 3   B Complex-C (SUPER B COMPLEX PO) Take 1 tablet by mouth daily.     celecoxib (CELEBREX) 100 MG capsule Take 1 capsule by mouth once daily 30 capsule 0   Cholecalciferol (VITAMIN D) 2000 UNITS CAPS Take 4,000 Units by mouth daily.     cyclobenzaprine (FLEXERIL) 10 MG tablet Take 1 tablet (10 mg total) by mouth 2 (two) times daily as needed for muscle spasms. 20 tablet 0   famotidine (PEPCID) 40 MG tablet Take 1 tablet (40 mg total) by mouth every evening. 90 tablet 2   Flaxseed, Linseed, (FLAX SEED OIL) 1000 MG CAPS Take 1,000 mg by mouth daily.     fluticasone (FLONASE) 50 MCG/ACT nasal spray Place 2 sprays into both nostrils daily. 48 g 3   gabapentin (NEURONTIN) 300 MG capsule Take 1 capsule (300 mg total) by mouth 2 (two) times daily as needed. 180 capsule 2   hydrochlorothiazide (HYDRODIURIL) 25 MG tablet Take  1 tablet  Daily  for BP & Fluid Retention /Ankle Swelling - Patient knows to take by mouth 90 tablet 3   loratadine (EQ LORATADINE) 10 MG tablet TAKE 1 TABLET BY MOUTH DAILY FOR ALLERGIES 90 tablet 0   Omega-3 Fatty Acids (FISH OIL) 1000 MG CAPS Take 1,000 mg by mouth daily.     rosuvastatin (CRESTOR) 20 MG tablet Take 1 tablet (20 mg total) by mouth daily. 30 tablet 11   sildenafil (VIAGRA) 100 MG tablet Take 1/2 to 1  tablet /day  ONLY if needed for XXXX 30 tablet 0   tamsulosin (FLOMAX) 0.4 MG CAPS capsule Take 1 capsule (0.4 mg total) by mouth daily. 30 capsule 3   traMADol (ULTRAM) 50 MG tablet Take 1 tablet (50 mg total) by mouth every 6 (six) hours as needed. 10 tablet 0   No current facility-administered medications on file prior to visit.   Allergies:  Allergies  Allergen Reactions   Ace Inhibitors     REACTION: cough   Trilipix [Choline Fenofibrate] Other (See Comments)    Heart burn   Health Maintenance:  Immunization History  Administered Date(s) Administered   PNEUMOCOCCAL  CONJUGATE-20 05/05/2021   PPD Test 02/13/2014   Pneumococcal Polysaccharide-23 02/23/2002   Tdap 05/15/2009, 05/02/2020   Tetanus: 2011 will wait until after COVID vaccine Pneumovax: 2004 N/A Prevnar 13: N/A Flu vaccine: 2020, at work Zostavax: Due dicussed   HIV : screening 2018 non-reactive  Colonoscopy: 2018, Due 2021 Polyps EGD: 2008 Eye Exam: 2019, Due for 2020 Dentist: 2019, Due for 2020  Patient Care Team: Unk Pinto, MD as PCP - General (Internal Medicine) Armbruster, Carlota Raspberry, MD as Consulting Physician (Gastroenterology)  Medical History:  has Hyperlipidemia; Essential hypertension; GERD; Vitamin D deficiency; Other abnormal glucose (hx of prediabetes); Medication management; Overweight (BMI 25.0-29.9); and Gout on their problem list. Surgical History:  He  has a past surgical history that includes Back surgery (2012); Laparoscopic inguinal hernia with umbilical hernia (Right, 08/15/2013); Insertion of mesh (N/A, 08/15/2013); Hernia repair; Vitrectomy 25 gauge with scleral buckle (Right, 08/30/2020); Photocoagulation with laser (Right, 08/30/2020); Gas insertion (Right, 08/30/2020); Gas/fluid exchange (Right, 08/30/2020); and Perfluorone injection (Right, 08/30/2020). Family History:  His family history includes Colon cancer (age of onset: 26) in his brother; Diabetes in his mother; Hypertension in his  brother, brother, brother, and mother. Social History:   reports that he quit smoking about 19 years ago. His smoking use included cigarettes. He has never used smokeless tobacco. He reports that he does not drink alcohol and does not use drugs. Review of Systems:  Review of Systems  Constitutional: Negative.  Negative for chills, fever and weight loss.  HENT: Negative.  Negative for congestion and hearing loss.   Eyes: Negative.  Negative for blurred vision and double vision.  Respiratory: Negative.  Negative for cough and shortness of breath.   Cardiovascular:  Positive for chest pain (fullness in left lower chest). Negative for palpitations, orthopnea and leg swelling.  Gastrointestinal: Negative.  Negative for abdominal pain, constipation, diarrhea, heartburn, nausea and vomiting.  Genitourinary: Negative.   Musculoskeletal: Negative.  Negative for falls, joint pain and myalgias.  Skin: Negative.  Negative for rash.  Neurological:  Negative for dizziness, tingling, tremors, loss of consciousness and headaches.  Psychiatric/Behavioral:  Negative for depression, memory loss and suicidal ideas.     Physical Exam: Estimated body mass index is 29.35 kg/m as calculated from the following:   Height as of 09/22/21: '5\' 8"'$  (1.727 m).   Weight as of 09/22/21: 193 lb (87.5 kg). There were no vitals taken for this visit. General Appearance: Well nourished, in no apparent distress.  Eyes: PERRLA, EOMs, conjunctiva no swelling or erythema, normal fundi and vessels.  Sinuses: No Frontal/maxillary tenderness  ENT/Mouth: Ext aud canals clear, normal light reflex with TMs without erythema, bulging. Good dentition. No erythema, swelling, or exudate on post pharynx. Tonsils not swollen or erythematous. Hearing normal.  Neck: Supple, thyroid normal. No bruits  Respiratory: Respiratory effort normal, BS equal bilaterally without rales, rhonchi, wheezing or stridor.  Cardio: RRR without murmurs, rubs or  gallops. Brisk peripheral pulses without edema.  Chest: symmetric, with normal excursions and percussion.  Abdomen: Soft, nontender, no guarding, rebound, hernias, masses, or organomegaly.  Lymphatics: Non tender without lymphadenopathy.  Musculoskeletal: Full ROM all peripheral extremities,5/5 strength, and normal gait.  Skin: Warm, dry without rashes, lesions, ecchymosis. Neuro: Cranial nerves intact, reflexes equal bilaterally. Normal muscle tone, no cerebellar symptoms. Sensation intact.  Psych: Awake and oriented X 3, normal affect, Insight and Judgment appropriate.  EKG: Sinus bradycardia, no ST changes  Shaul Trautman E  5:45 PM [facility]

## 2021-11-13 ENCOUNTER — Ambulatory Visit: Payer: BC Managed Care – PPO | Admitting: Nurse Practitioner

## 2021-11-13 ENCOUNTER — Encounter: Payer: Self-pay | Admitting: Nurse Practitioner

## 2021-11-13 VITALS — BP 132/72 | HR 65 | Temp 97.2°F | Ht 68.0 in | Wt 199.4 lb

## 2021-11-13 DIAGNOSIS — Z79899 Other long term (current) drug therapy: Secondary | ICD-10-CM

## 2021-11-13 DIAGNOSIS — K219 Gastro-esophageal reflux disease without esophagitis: Secondary | ICD-10-CM

## 2021-11-13 DIAGNOSIS — I10 Essential (primary) hypertension: Secondary | ICD-10-CM | POA: Diagnosis not present

## 2021-11-13 DIAGNOSIS — E782 Mixed hyperlipidemia: Secondary | ICD-10-CM

## 2021-11-13 DIAGNOSIS — E663 Overweight: Secondary | ICD-10-CM

## 2021-11-13 DIAGNOSIS — Z23 Encounter for immunization: Secondary | ICD-10-CM | POA: Diagnosis not present

## 2021-11-13 DIAGNOSIS — N401 Enlarged prostate with lower urinary tract symptoms: Secondary | ICD-10-CM

## 2021-11-13 DIAGNOSIS — E559 Vitamin D deficiency, unspecified: Secondary | ICD-10-CM

## 2021-11-13 DIAGNOSIS — R7309 Other abnormal glucose: Secondary | ICD-10-CM | POA: Diagnosis not present

## 2021-11-13 DIAGNOSIS — M1000 Idiopathic gout, unspecified site: Secondary | ICD-10-CM

## 2021-11-14 ENCOUNTER — Other Ambulatory Visit: Payer: Self-pay | Admitting: Nurse Practitioner

## 2021-11-14 DIAGNOSIS — D696 Thrombocytopenia, unspecified: Secondary | ICD-10-CM

## 2021-11-14 LAB — CBC WITH DIFFERENTIAL/PLATELET
Absolute Monocytes: 620 cells/uL (ref 200–950)
Basophils Absolute: 42 cells/uL (ref 0–200)
Basophils Relative: 0.8 %
Eosinophils Absolute: 318 cells/uL (ref 15–500)
Eosinophils Relative: 6 %
HCT: 44.3 % (ref 38.5–50.0)
Hemoglobin: 14.8 g/dL (ref 13.2–17.1)
Lymphs Abs: 2332 cells/uL (ref 850–3900)
MCH: 31.3 pg (ref 27.0–33.0)
MCHC: 33.4 g/dL (ref 32.0–36.0)
MCV: 93.7 fL (ref 80.0–100.0)
MPV: 11.6 fL (ref 7.5–12.5)
Monocytes Relative: 11.7 %
Neutro Abs: 1988 cells/uL (ref 1500–7800)
Neutrophils Relative %: 37.5 %
Platelets: 103 10*3/uL — ABNORMAL LOW (ref 140–400)
RBC: 4.73 10*6/uL (ref 4.20–5.80)
RDW: 13.1 % (ref 11.0–15.0)
Total Lymphocyte: 44 %
WBC: 5.3 10*3/uL (ref 3.8–10.8)

## 2021-11-14 LAB — URIC ACID: Uric Acid, Serum: 6.1 mg/dL (ref 4.0–8.0)

## 2021-11-14 LAB — HEMOGLOBIN A1C
Hgb A1c MFr Bld: 5.4 % of total Hgb (ref <5.7)
Mean Plasma Glucose: 108 mg/dL
eAG (mmol/L): 6 mmol/L

## 2021-11-14 LAB — COMPLETE METABOLIC PANEL WITH GFR
AG Ratio: 1.8 (calc) (ref 1.0–2.5)
ALT: 11 U/L (ref 9–46)
AST: 19 U/L (ref 10–35)
Albumin: 4.5 g/dL (ref 3.6–5.1)
Alkaline phosphatase (APISO): 88 U/L (ref 35–144)
BUN: 16 mg/dL (ref 7–25)
CO2: 31 mmol/L (ref 20–32)
Calcium: 10.3 mg/dL (ref 8.6–10.3)
Chloride: 103 mmol/L (ref 98–110)
Creat: 1.1 mg/dL (ref 0.70–1.35)
Globulin: 2.5 g/dL (calc) (ref 1.9–3.7)
Glucose, Bld: 90 mg/dL (ref 65–99)
Potassium: 3.9 mmol/L (ref 3.5–5.3)
Sodium: 140 mmol/L (ref 135–146)
Total Bilirubin: 0.6 mg/dL (ref 0.2–1.2)
Total Protein: 7 g/dL (ref 6.1–8.1)
eGFR: 74 mL/min/{1.73_m2} (ref >=60)

## 2021-11-14 LAB — LIPID PANEL
Cholesterol: 167 mg/dL (ref <200)
HDL: 40 mg/dL (ref >=40)
LDL Cholesterol (Calc): 94 mg/dL (calc)
Non-HDL Cholesterol (Calc): 127 mg/dL (calc) (ref <130)
Total CHOL/HDL Ratio: 4.2 (calc) (ref <5.0)
Triglycerides: 252 mg/dL — ABNORMAL HIGH (ref <150)

## 2021-12-22 ENCOUNTER — Ambulatory Visit: Payer: BC Managed Care – PPO

## 2022-01-18 ENCOUNTER — Other Ambulatory Visit: Payer: Self-pay | Admitting: Nurse Practitioner

## 2022-01-18 ENCOUNTER — Other Ambulatory Visit: Payer: Self-pay | Admitting: Internal Medicine

## 2022-01-18 DIAGNOSIS — I10 Essential (primary) hypertension: Secondary | ICD-10-CM

## 2022-01-18 DIAGNOSIS — M1A00X0 Idiopathic chronic gout, unspecified site, without tophus (tophi): Secondary | ICD-10-CM

## 2022-01-29 ENCOUNTER — Telehealth: Payer: Self-pay | Admitting: Nurse Practitioner

## 2022-01-29 ENCOUNTER — Other Ambulatory Visit: Payer: Self-pay | Admitting: Nurse Practitioner

## 2022-01-29 DIAGNOSIS — N529 Male erectile dysfunction, unspecified: Secondary | ICD-10-CM

## 2022-01-29 MED ORDER — SILDENAFIL CITRATE 100 MG PO TABS: ORAL_TABLET | ORAL | 0 refills | Status: DC

## 2022-01-29 NOTE — Telephone Encounter (Signed)
Pt is requesting a refill on Viagra to go to [facility]

## 2022-01-29 NOTE — Telephone Encounter (Signed)
Done

## 2022-03-18 ENCOUNTER — Other Ambulatory Visit: Payer: Self-pay | Admitting: Nurse Practitioner

## 2022-03-18 DIAGNOSIS — N529 Male erectile dysfunction, unspecified: Secondary | ICD-10-CM

## 2022-04-03 ENCOUNTER — Encounter (INDEPENDENT_AMBULATORY_CARE_PROVIDER_SITE_OTHER): Payer: BC Managed Care – PPO | Admitting: Ophthalmology

## 2022-04-07 NOTE — Progress Notes (Signed)
[facility] Note  04/13/2022     CHIEF COMPLAINT Patient presents for Retina Evaluation    HISTORY OF PRESENT ILLNESS: Caleb Johns is a 67 y.o. male who presents to the clinic today for:   HPI     Retina Evaluation   In right eye.  This started 1 year ago.  Duration of 1 year.  I, the attending physician,  performed the HPI with the patient and updated documentation appropriately.        Comments   1 year retina eval RD OD pt is reporting no vision change noticed pt denies any flashes or floaters       Last edited by Bernarda Caffey, MD on 04/13/2022  7:46 PM.    Pt states he is not having any problems with his vision, he is borderline diabetic, not on any medication  Referring physician: Shirleen Schirmer PA [address] [place], [place] [address]  HISTORICAL INFORMATION:   Selected notes from the [place] Referred by Shirleen Schirmer, PA for RD OD LEE: 08/29/2020 Ocular Hx- RD OD BCVA OD CF OS 20/25 PMH- gout, HTN   CURRENT MEDICATIONS: No current outpatient medications on file. (Ophthalmic Drugs)   No current facility-administered medications for this visit. (Ophthalmic Drugs)   Current Outpatient Medications (Other)  Medication Sig   acyclovir (ZOVIRAX) 800 MG tablet TAKE 1 TABLET BY MOUTH ONCE DAILY TO  PREVENT  FEVER  BLISTERS  AND  COLD  SORES (Patient not taking: Reported on 11/13/2021)   allopurinol (ZYLOPRIM) 300 MG tablet Take  1 tablet   Daily  to Prevent Gout                                             /                                         TAKE                                              BY                                   MOUTH                                      ONCE DAILY   alum & mag hydroxide-simeth (MAALOX/MYLANTA) 200-200-20 MG/5ML suspension Take 30 mLs by mouth 2 (two) times daily.   Ascorbic Acid (VITAMIN C) 1000 MG tablet Take 1,000 mg by mouth daily.   aspirin EC 81 MG tablet Take 81 mg by  mouth daily.   atenolol (TENORMIN) 100 MG tablet Take  1 tablet  Daily for BP                                          /  Take                                    by                                        mouth                                  once daily   B Complex-C (SUPER B COMPLEX PO) Take 1 tablet by mouth daily.   celecoxib (CELEBREX) 100 MG capsule Take 1 capsule by mouth once daily   Cholecalciferol (VITAMIN D) 2000 UNITS CAPS Take 4,000 Units by mouth daily.   cyclobenzaprine (FLEXERIL) 10 MG tablet Take 1 tablet (10 mg total) by mouth 2 (two) times daily as needed for muscle spasms.   famotidine (PEPCID) 40 MG tablet Take 1 tablet (40 mg total) by mouth every evening.   Flaxseed, Linseed, (FLAX SEED OIL) 1000 MG CAPS Take 1,000 mg by mouth daily.   fluticasone (FLONASE) 50 MCG/ACT nasal spray Place 2 sprays into both nostrils daily.   gabapentin (NEURONTIN) 300 MG capsule Take 1 capsule (300 mg total) by mouth 2 (two) times daily as needed.   hydrochlorothiazide (HYDRODIURIL) 25 MG tablet Take  1 tablet  Daily  for BP & Fluid Retention /Ankle Swelling                                       /                                     TAKE                                    BY                                    MOUTH   loratadine (EQ LORATADINE) 10 MG tablet TAKE 1 TABLET BY MOUTH DAILY FOR ALLERGIES   Omega-3 Fatty Acids (FISH OIL) 1000 MG CAPS Take 1,000 mg by mouth daily.   rosuvastatin (CRESTOR) 20 MG tablet Take 1 tablet (20 mg total) by mouth daily.   sildenafil (VIAGRA) 100 MG tablet TAKE ONE-HALF TO ONE TABLET BY MOUTH DAILY ONLY IF NEEDED FOR XXXX   tamsulosin (FLOMAX) 0.4 MG CAPS capsule Take 1 capsule (0.4 mg total) by mouth daily.   No current facility-administered medications for this visit. (Other)   REVIEW OF SYSTEMS: ROS   Positive for: Eyes, Respiratory Negative for: Constitutional, Gastrointestinal, Neurological, Skin, Genitourinary,  Musculoskeletal, HENT, Endocrine, Cardiovascular, Psychiatric, Allergic/Imm, Heme/Lymph Last edited by Parthenia Ames, COT on 04/13/2022  1:11 PM.     ALLERGIES Allergies  Allergen Reactions   Ace Inhibitors     REACTION: cough   Trilipix [Choline Fenofibrate] Other (See Comments)    Heart burn   PAST MEDICAL HISTORY Past Medical History:  Diagnosis Date   GERD (gastroesophageal reflux disease)  Gout    HSV-1 (herpes simplex virus 1) infection    HSV-2 (herpes simplex virus 2) infection    Hyperlipidemia    Hypertension    Hypogonadism male    Obese    Pneumonia    walking -many years ago   Pre-diabetes    Past Surgical History:  Procedure Laterality Date   BACK SURGERY  2012   on back-hernia   GAS INSERTION Right 08/30/2020   Procedure: INSERTION OF GAS - C3F8;  Surgeon: Bernarda Caffey, MD;  Location: [facility];  Service: Ophthalmology;  Laterality: Right;   GAS/FLUID EXCHANGE Right 08/30/2020   Procedure: GAS/FLUID EXCHANGE;  Surgeon: Bernarda Caffey, MD;  Location: [facility];  Service: Ophthalmology;  Laterality: Right;   HERNIA REPAIR     INSERTION OF MESH N/A 08/15/2013   Procedure: INSERTION OF MESH;  Surgeon: Odis Hollingshead, MD;  Location: [facility];  Service: General;  Laterality: N/A;   LAPAROSCOPIC INGUINAL HERNIA WITH UMBILICAL HERNIA Right [id number]   Procedure: LAPAROSCOPIC RIGH  INGUINAL HERNIA WITH OPEN UMBILICAL HERNIA;  Surgeon: Odis Hollingshead, MD;  Location: [facility];  Service: General;  Laterality: Right;  umbilical repair with mesh   PERFLUORONE INJECTION Right 08/30/2020   Procedure: PERFLUORONE INJECTION;  Surgeon: Bernarda Caffey, MD;  Location: [facility];  Service: Ophthalmology;  Laterality: Right;   PHOTOCOAGULATION WITH LASER Right 08/30/2020   Procedure: PHOTOCOAGULATION WITH LASER;  Surgeon: Bernarda Caffey, MD;  Location: [facility];  Service: Ophthalmology;  Laterality: Right;   VITRECTOMY 25 GAUGE WITH SCLERAL BUCKLE Right 08/30/2020   Procedure: SCLERAL BUCKLE  WITH 25 GAUGE PARS PLANA VITRECTOMY;  Surgeon: Bernarda Caffey, MD;  Location: [facility];  Service: Ophthalmology;  Laterality: Right;   FAMILY HISTORY Family History  Problem Relation Age of Onset   Diabetes Mother    Hypertension Mother    Hypertension Brother    Hypertension Brother    Hypertension Brother    Colon cancer Brother 24   Colon polyps Neg Hx    Esophageal cancer Neg Hx    Rectal cancer Neg Hx    Stomach cancer Neg Hx    SOCIAL HISTORY Social History   Tobacco Use   Smoking status: Former    Types: Cigarettes    Quit date: 02/23/2002    Years since quitting: 20.1   Smokeless tobacco: Never  Vaping Use   Vaping Use: Never used  Substance Use Topics   Alcohol use: No   Drug use: No       OPHTHALMIC EXAM: Base Eye Exam     Visual Acuity (Snellen - Linear)       Right Left   Dist [place] 20/25 -2 20/25   Dist ph [place] NI NI         Tonometry (Tonopen, 1:14 PM)       Right Left   Pressure 12 12         Pupils       Pupils Dark Light Shape React APD   Right PERRL 4 3 Round Brisk None   Left PERRL 4 3 Round Brisk None         Visual Fields       Left Right    Full Full         Extraocular Movement       Right Left    Full, Ortho Full, Ortho         Neuro/Psych     Oriented x3: Yes   Mood/Affect: Normal  Dilation     Both eyes: 2.5% Phenylephrine @ 1:14 PM           Slit Lamp and Fundus Exam     Slit Lamp Exam       Right Left   Lids/Lashes Dermatochalasis - upper lid Dermatochalasis - upper lid   Conjunctiva/Sclera melanosis melanosis   Cornea arcus, fine endo pigment inferiorly, Well healed temporal cataract wound arcus, trace PEE   Anterior Chamber deep and clear; narrow angles deep and clear   Iris Round and moderately dilated, No NVI round and dilated, no NVI   Lens PC IOL in good position, mild pigment 2+ NS, 2+CS   Anterior Vitreous post vitrectomy, clear syneresis, +pigment         Fundus Exam        Right Left   Disc pink and sharp pink and sharp   C/D Ratio 0.1 0.2   Macula Flat/re-attached, good foveal reflex, trace ERM, mild RPE mottling and clumping, no edema, [place], focal IRH along ST arcades, rare MA flat, good foveal reflex, mild RPE mottling, no heme or edema   Vessels attenuated, mild av crossing changes, mild Co[place] wiring, mild tortuosity attenuated, mild tortuosity   Periphery retina attached over buckle; good buckle height; good laser over buckle; focal fibrosis overlaying buckle at 1200, ORIGINALLY: Bullous detachment from 1030 to 0130, multiple retinal holes and tears at 1100, 1200, and 0115 attached, no heme, no RT/RD on 360 exam           IMAGING AND PROCEDURES  Imaging and Procedures for 04/13/2022  OCT, Retina - OU - Both Eyes       Right Eye Quality was good. Central Foveal Thickness: 292. Progression has been stable. Findings include normal foveal contour, no IRF, no SRF, myopic contour, outer retinal atrophy (Retina re-attached, interval improvement in ellipsoid signal, focal cyst IT fovea).   Left Eye Quality was good. Central Foveal Thickness: 262. Progression has been stable. Findings include normal foveal contour, no IRF, no SRF.   Notes *Images captured and stored on drive  Diagnosis / Impression:  OD: Retina re-attached, interval improvement in ellipsoid signal, focal cystic IT fovea OS: NFP, no IRF/SRF  Clinical management:  See below  Abbreviations: NFP - Normal foveal profile. CME - cystoid macular edema. PED - pigment epithelial detachment. IRF - intraretinal fluid. SRF - subretinal fluid. EZ - ellipsoid zone. ERM - epiretinal membrane. ORA - outer retinal atrophy. ORT - outer retinal tubulation. SRHM - subretinal hyper-reflective material. IRHM - intraretinal hyper-reflective material            ASSESSMENT/PLAN:    ICD-10-CM   1. Right retinal detachment  H33.21 OCT, Retina - OU - Both Eyes    2. Essential hypertension  I10     3.  Hypertensive retinopathy of both eyes  H35.033 OCT, Retina - OU - Both Eyes    4. Combined forms of age-related cataract of left eye  H25.812     5. Pseudophakia  Z96.1     6. Combined forms of age-related cataract of both eyes  H25.813      1. Rhegmatogenous retinal detachment with multiple holes/tears OD - bullous superior mac off detachment, onset of foveal involvement, 08/29/20 by history - detached from 1030 to 0130, with multiple retinal holes/tears at 1100,1200, and 0115--posterior extension of SRF through fovea/macula - s/p #41 SBP + 25g PPV/PFO/EL/C3F8 OD, 07.08.22 - doing well, BCVA 20/20 (post cataract sx) - retina attached and in good  position -- good buckle height and laser around breaks             - IOP 12 - pt is cleared from a retina standpoint for release to Dr. Katy Fitch and resumption of primary eye care  2,3. Hypertensive retinopathy OU - discussed importance of tight BP control - monitor   4. Age related cataract OS - The symptoms of cataract, surgical options, and treatments and risks were discussed with patient - under the expert management of Dr. Katy Fitch  5. Pseudophakia OD  - s/p CE/IOL OD, 12.23.22 with Dr. Katy Fitch  - IOL in good position, doing well  - monitor   Ophthalmic Meds Ordered this visit:  No orders of the defined types were placed in this encounter.    Return if symptoms worsen or fail to improve.  There are no Patient Instructions on file for this visit.  This document serves as a record of services personally performed by Gardiner Sleeper, MD, PhD. It was created on their behalf by Orvan Falconer, an ophthalmic technician. The creation of this record is the provider's dictation and/or activities during the visit.    Electronically signed by: Orvan Falconer, OA, 04/13/22  7:47 PM  This document serves as a record of services personally performed by Gardiner Sleeper, MD, PhD. It was created on their behalf by San Jetty. Owens Shark, OA an ophthalmic  technician. The creation of this record is the provider's dictation and/or activities during the visit.    Electronically signed by: San Jetty. Owens Shark, [place] 02.19.2024 7:47 PM  Gardiner Sleeper, M.D., Ph.D. Diseases & Surgery of the Retina and Vitreous [facility]  I have reviewed the above documentation for accuracy and completeness, and I agree with the above. Gardiner Sleeper, M.D., Ph.D. 04/13/22 7:50 PM   Abbreviations: M myopia (nearsighted); A astigmatism; H hyperopia (farsighted); P presbyopia; Mrx spectacle prescription;  CTL contact lenses; OD right eye; OS left eye; OU both eyes  XT exotropia; ET esotropia; PEK punctate epithelial keratitis; PEE punctate epithelial erosions; DES dry eye syndrome; MGD meibomian gland dysfunction; ATs artificial tears; PFAT's preservative free artificial tears; [place] nuclear sclerotic cataract; PSC posterior subcapsular cataract; ERM epi-retinal membrane; PVD posterior vitreous detachment; RD retinal detachment; DM diabetes mellitus; DR diabetic retinopathy; NPDR non-proliferative diabetic retinopathy; PDR proliferative diabetic retinopathy; CSME clinically significant macular edema; DME diabetic macular edema; dbh dot blot hemorrhages; CWS cotton wool spot; POAG primary open angle glaucoma; C/D cup-to-disc ratio; HVF humphrey visual field; GVF goldmann visual field; OCT optical coherence tomography; IOP intraocular pressure; BRVO Branch retinal vein occlusion; CRVO central retinal vein occlusion; CRAO central retinal artery occlusion; BRAO branch retinal artery occlusion; RT retinal tear; SB scleral buckle; PPV pars plana vitrectomy; VH Vitreous hemorrhage; PRP panretinal laser photocoagulation; IVK intravitreal kenalog; VMT vitreomacular traction; MH Macular hole;  NVD neovascularization of the disc; NVE neovascularization elsewhere; AREDS age related eye disease study; ARMD age related macular degeneration; POAG primary open angle glaucoma;  EBMD epithelial/anterior basement membrane dystrophy; ACIOL anterior chamber intraocular lens; IOL intraocular lens; PCIOL posterior chamber intraocular lens; Phaco/IOL phacoemulsification with intraocular lens placement; [place] photorefractive keratectomy; LASIK laser assisted in situ keratomileusis; HTN hypertension; DM diabetes mellitus; COPD chronic obstructive pulmonary disease

## 2022-04-13 ENCOUNTER — Encounter (INDEPENDENT_AMBULATORY_CARE_PROVIDER_SITE_OTHER): Payer: Self-pay | Admitting: Ophthalmology

## 2022-04-13 ENCOUNTER — Ambulatory Visit (INDEPENDENT_AMBULATORY_CARE_PROVIDER_SITE_OTHER): Payer: PPO | Admitting: Ophthalmology

## 2022-04-13 DIAGNOSIS — I10 Essential (primary) hypertension: Secondary | ICD-10-CM | POA: Diagnosis not present

## 2022-04-13 DIAGNOSIS — H25813 Combined forms of age-related cataract, bilateral: Secondary | ICD-10-CM

## 2022-04-13 DIAGNOSIS — H35033 Hypertensive retinopathy, bilateral: Secondary | ICD-10-CM | POA: Diagnosis not present

## 2022-04-13 DIAGNOSIS — H25812 Combined forms of age-related cataract, left eye: Secondary | ICD-10-CM

## 2022-04-13 DIAGNOSIS — H3321 Serous retinal detachment, right eye: Secondary | ICD-10-CM | POA: Diagnosis not present

## 2022-04-13 DIAGNOSIS — Z961 Presence of intraocular lens: Secondary | ICD-10-CM

## 2022-04-15 ENCOUNTER — Telehealth: Payer: Self-pay | Admitting: Nurse Practitioner

## 2022-04-15 ENCOUNTER — Other Ambulatory Visit: Payer: Self-pay | Admitting: Nurse Practitioner

## 2022-04-15 DIAGNOSIS — N529 Male erectile dysfunction, unspecified: Secondary | ICD-10-CM

## 2022-04-15 NOTE — Telephone Encounter (Signed)
Patient is requesting a refill on Sildenafil to [facility] on [address]

## 2022-04-15 NOTE — Telephone Encounter (Signed)
Done

## 2022-05-04 ENCOUNTER — Other Ambulatory Visit: Payer: Self-pay | Admitting: Nurse Practitioner

## 2022-05-04 DIAGNOSIS — N529 Male erectile dysfunction, unspecified: Secondary | ICD-10-CM

## 2022-05-05 NOTE — Progress Notes (Unsigned)
Complete Physical  Assessment and Plan:  Encounter for general adult medical examination with abnormal findings Yearly Tdap received today  Essential hypertension -     CBC with Differential/Platelet -     COMPLETE METABOLIC PANEL WITH GFR -     TSH -     Urinalysis, Routine w reflex microscopic -     Microalbumin / creatinine urine ratio -     EKG 12-Lead - continue medications, DASH diet, exercise and monitor at home. Call if greater than 130/80.   Hyperlipidemia -     Lipid panel Continue  check lipids decrease fatty foods increase activity.   Other abnormal glucose (hx of prediabetes) -     Hemoglobin A1c Discussed disease progression and risks Discussed diet/exercise, weight management and risk modification  Idiopathic chronic gout without tophus, unspecified site - recheck Uric acid as needed, Diet discussed, continue medications.  Medication management -     Magnesium  Vitamin D deficiency Continue Vit D supplementation to maintain value in therapeutic level of 60-100  -     VITAMIN D 25 Hydroxy (Vit-D Deficiency, Fractures)  Gastroesophageal reflux disease, unspecified whether esophagitis present Focus on dietary and behavior modifications -     famotidine (PEPCID) 40 MG tablet; Take 1 tablet (40 mg total) by mouth every evening.  Obesity Long discussion about weight loss, diet, and exercise Recommended diet heavy in fruits and veggies and low in animal meats, cheeses, and dairy products, appropriate calorie intake Follow up at next visit   Screening PSA (prostate specific antigen) -     PSA  Screening, ischemic heart disease -     EKG 12-Lead  Screening for blood or protein in urine -     Urinalysis w microscopic + reflex cultur  Microalbumin/creatinine urine ratio      Discussed med's effects and SE's. Screening labs and tests as requested with regular follow-up as recommended. Over 40 minutes of exam, counseling, chart review and critical  decision making was performed  HPI Patient presents for a complete physical.   has Hyperlipidemia; Essential hypertension; GERD; Vitamin D deficiency; Other abnormal glucose (hx of prediabetes); Medication management; Overweight (BMI 25.0-29.9); and Gout on their problem list.   His blood pressure has been controlled at home, today their BP is    BP Readings from Last 3 Encounters:  11/13/21 132/72  09/22/21 (!) 142/84  09/15/21 138/78  He does not exercise.  Denies headaches, chest pain, shortness of breath, burred vision and dizziness.   BMI is There is no height or weight on file to calculate BMI., he has not been working on diet and exercise. Wt Readings from Last 3 Encounters:  11/13/21 199 lb 6.4 oz (90.4 kg)  09/22/21 193 lb (87.5 kg)  09/15/21 200 lb (90.7 kg)   He has been having heartburn symptoms more frequently and uses Famotidine as needed. He is also using baking soda as needed.   He has noticed a swollen tender area on his upper back which has been present for several weeks.   He is on cholesterol medication and denies myalgias. His cholesterol is not at goal. The cholesterol last visit was:   Lab Results  Component Value Date   CHOL 167 11/13/2021   HDL 40 11/13/2021   LDLCALC 94 11/13/2021   TRIG 252 (H) 11/13/2021   CHOLHDL 4.2 11/13/2021   He has not been working on diet and exercise for prediabetes, he is on bASA, he is not on ACE/ARB and denies  hyperglycemia, hypoglycemia , nausea, polydipsia and polyuria. Last A1C in the office was:  Lab Results  Component Value Date   HGBA1C 5.4 11/13/2021   Last GFR:  Lab Results  Component Value Date   GFRAA 108 05/02/2020   Patient is on Vitamin D supplement.   Lab Results  Component Value Date   VD25OH 78 05/05/2021     Last PSA was: Lab Results  Component Value Date   PSA 1.81 05/05/2021    Current Medications:  Current Outpatient Medications on File Prior to Visit  Medication Sig Dispense Refill    acyclovir (ZOVIRAX) 800 MG tablet TAKE 1 TABLET BY MOUTH ONCE DAILY TO  PREVENT  FEVER  BLISTERS  AND  COLD  SORES (Patient not taking: Reported on 11/13/2021) 90 tablet 0   allopurinol (ZYLOPRIM) 300 MG tablet Take  1 tablet   Daily  to Prevent Gout                                             /                                         TAKE                                              BY                                   MOUTH                                      ONCE DAILY 90 tablet 3   alum & mag hydroxide-simeth (MAALOX/MYLANTA) 200-200-20 MG/5ML suspension Take 30 mLs by mouth 2 (two) times daily.     Ascorbic Acid (VITAMIN C) 1000 MG tablet Take 1,000 mg by mouth daily.     aspirin EC 81 MG tablet Take 81 mg by mouth daily.     atenolol (TENORMIN) 100 MG tablet Take  1 tablet  Daily for BP                                          /                                 Take                                    by                                        mouth  once daily 90 tablet 3   B Complex-C (SUPER B COMPLEX PO) Take 1 tablet by mouth daily.     celecoxib (CELEBREX) 100 MG capsule Take 1 capsule by mouth once daily 30 capsule 0   Cholecalciferol (VITAMIN D) 2000 UNITS CAPS Take 4,000 Units by mouth daily.     cyclobenzaprine (FLEXERIL) 10 MG tablet Take 1 tablet (10 mg total) by mouth 2 (two) times daily as needed for muscle spasms. 20 tablet 0   famotidine (PEPCID) 40 MG tablet Take 1 tablet (40 mg total) by mouth every evening. 90 tablet 2   Flaxseed, Linseed, (FLAX SEED OIL) 1000 MG CAPS Take 1,000 mg by mouth daily.     fluticasone (FLONASE) 50 MCG/ACT nasal spray Place 2 sprays into both nostrils daily. 48 g 3   gabapentin (NEURONTIN) 300 MG capsule Take 1 capsule (300 mg total) by mouth 2 (two) times daily as needed. 180 capsule 2   hydrochlorothiazide (HYDRODIURIL) 25 MG tablet Take  1 tablet  Daily  for BP & Fluid Retention /Ankle Swelling                                        /                                     TAKE                                    BY                                    MOUTH 90 tablet 3   loratadine (EQ LORATADINE) 10 MG tablet TAKE 1 TABLET BY MOUTH DAILY FOR ALLERGIES 90 tablet 0   Omega-3 Fatty Acids (FISH OIL) 1000 MG CAPS Take 1,000 mg by mouth daily.     rosuvastatin (CRESTOR) 20 MG tablet Take 1 tablet (20 mg total) by mouth daily. 30 tablet 11   sildenafil (VIAGRA) 100 MG tablet TAKE HALF TO ONE TABLET BY MOUTH DAILY AS NEEDED FOR XXX 30 tablet 0   tamsulosin (FLOMAX) 0.4 MG CAPS capsule Take 1 capsule (0.4 mg total) by mouth daily. 30 capsule 3   No current facility-administered medications on file prior to visit.   Allergies:  Allergies  Allergen Reactions   Ace Inhibitors     REACTION: cough   Trilipix [Choline Fenofibrate] Other (See Comments)    Heart burn   Health Maintenance:  Immunization History  Administered Date(s) Administered   Influenza, High Dose Seasonal PF 11/13/2021   PNEUMOCOCCAL CONJUGATE-20 05/05/2021   PPD Test 02/13/2014   Pneumococcal Polysaccharide-23 02/23/2002   Tdap 05/15/2009, 05/02/2020   Tetanus:  04/2020 Pneumovax: 2004  Prevnar 20: 05/05/21 Flu vaccine: 2020, at work Zostavax: Due dicussed  [place] 3/21, 4/21 Booster 02/26/20 HIV : screening 2018 non-reactive  Colonoscopy:08/2019, Q5 years Pathology: adenomatous polyp  EGD: 2008 Eye Exam: 03/26/21 Dentist: 2019, due- needs root canal  Patient Care Team: Unk Pinto, MD as PCP - General (Internal Medicine) Armbruster, Carlota Raspberry, MD as Consulting Physician (Gastroenterology)  Medical History:  has Hyperlipidemia; Essential hypertension; GERD; Vitamin D deficiency; Other abnormal glucose (hx  of prediabetes); Medication management; Overweight (BMI 25.0-29.9); and Gout on their problem list. Surgical History:  He  has a past surgical history that includes Back surgery (2012); Laparoscopic inguinal hernia with  umbilical hernia (Right, 08/15/2013); Insertion of mesh (N/A, 08/15/2013); Hernia repair; Vitrectomy 25 gauge with scleral buckle (Right, 08/30/2020); Photocoagulation with laser (Right, 08/30/2020); Gas insertion (Right, 08/30/2020); Gas/fluid exchange (Right, 08/30/2020); and Perfluorone injection (Right, 08/30/2020). Family History:  His family history includes Colon cancer (age of onset: 51) in his brother; Diabetes in his mother; Hypertension in his brother, brother, brother, and mother. Social History:   reports that he quit smoking about 20 years ago. His smoking use included cigarettes. He has never used smokeless tobacco. He reports that he does not drink alcohol and does not use drugs.   Review of Systems:  Review of Systems  Constitutional: Negative.  Negative for chills and fever.  HENT: Negative.  Negative for congestion, hearing loss, sinus pain, sore throat and tinnitus.   Eyes: Negative.  Negative for blurred vision and double vision.  Respiratory: Negative.  Negative for cough, hemoptysis, sputum production, shortness of breath and wheezing.   Cardiovascular: Negative.  Negative for chest pain, palpitations and leg swelling.  Gastrointestinal:  Positive for heartburn. Negative for abdominal pain, constipation, diarrhea, nausea and vomiting.  Genitourinary: Negative.  Negative for dysuria and urgency.  Musculoskeletal: Negative.  Negative for back pain, falls, joint pain, myalgias and neck pain.  Skin: Negative.  Negative for rash.       Swollen tender area on back  Neurological:  Negative for dizziness, tingling, tremors, weakness and headaches.  Endo/Heme/Allergies:  Does not bruise/bleed easily.  Psychiatric/Behavioral:  Negative for depression and suicidal ideas. The patient is not nervous/anxious and does not have insomnia.     Physical Exam: Estimated body mass index is 30.32 kg/m as calculated from the following:   Height as of 11/13/21: '5\' 8"'$  (1.727 m).   Weight as of 11/13/21:  199 lb 6.4 oz (90.4 kg). There were no vitals taken for this visit.   General Appearance: Well nourished, in no apparent distress.  Eyes: PERRLA, EOMs, conjunctiva no swelling or erythema, normal fundi and vessels.  Sinuses: No Frontal/maxillary tenderness  ENT/Mouth: Ext aud canals clear, normal light reflex with TMs without erythema, bulging. Good dentition. No erythema, swelling, or exudate on post pharynx. Tonsils not swollen or erythematous. Hearing normal.  Neck: Supple, thyroid normal. No bruits  Respiratory: Respiratory effort normal, BS equal bilaterally without rales, rhonchi, wheezing or stridor.  Cardio: RRR without murmurs, rubs or gallops. Brisk peripheral pulses without edema.  Chest: symmetric, with normal excursions and percussion.  Abdomen: Soft, nontender, no guarding, rebound, hernias, masses, or organomegaly.  Lymphatics: Non tender without lymphadenopathy.  Genitourinary: deferred Musculoskeletal: Full ROM all peripheral extremities,5/5 strength, and normal gait.  Skin: Warm, dry . 2-3 cm epidermal cyst on upper back Neuro: Cranial nerves intact, reflexes equal bilaterally. Normal muscle tone, no cerebellar symptoms. Sensation intact.  Psych: Awake and oriented X 3, normal affect, Insight and Judgment appropriate.   Diagnosis: epidermal inclusion cyst - Location: back Procedure: Incision & drainage Informed consent:  Discussed risks (permanent loss of nail, permanent irregular growth of nail, infection, pain, bleeding, bruising, numbness, and recurrence of the condition) and benefits of the procedure, as well as the alternatives.  Informed consent was obtained. Type: partial The area was prepared and draped in a standard fashion. The lesion drained white, chalky, cyst material. The patient tolerated the procedure well. The  patient was instructed on post-op care.   EKG:  Sinus bradycardia, No ST changes   Dontrail Blackwell Mikki Santee, NP 8:45 AM [facility]  [facility]

## 2022-05-06 ENCOUNTER — Ambulatory Visit (INDEPENDENT_AMBULATORY_CARE_PROVIDER_SITE_OTHER): Payer: PPO | Admitting: Nurse Practitioner

## 2022-05-06 ENCOUNTER — Encounter: Payer: Self-pay | Admitting: Nurse Practitioner

## 2022-05-06 VITALS — BP 122/72 | HR 53 | Temp 97.7°F | Ht 68.0 in | Wt 205.0 lb

## 2022-05-06 DIAGNOSIS — Z79899 Other long term (current) drug therapy: Secondary | ICD-10-CM | POA: Diagnosis not present

## 2022-05-06 DIAGNOSIS — E782 Mixed hyperlipidemia: Secondary | ICD-10-CM | POA: Diagnosis not present

## 2022-05-06 DIAGNOSIS — E663 Overweight: Secondary | ICD-10-CM | POA: Diagnosis not present

## 2022-05-06 DIAGNOSIS — E6609 Other obesity due to excess calories: Secondary | ICD-10-CM

## 2022-05-06 DIAGNOSIS — M1000 Idiopathic gout, unspecified site: Secondary | ICD-10-CM

## 2022-05-06 DIAGNOSIS — K219 Gastro-esophageal reflux disease without esophagitis: Secondary | ICD-10-CM

## 2022-05-06 DIAGNOSIS — Z0000 Encounter for general adult medical examination without abnormal findings: Secondary | ICD-10-CM

## 2022-05-06 DIAGNOSIS — N401 Enlarged prostate with lower urinary tract symptoms: Secondary | ICD-10-CM

## 2022-05-06 DIAGNOSIS — J3089 Other allergic rhinitis: Secondary | ICD-10-CM

## 2022-05-06 DIAGNOSIS — I700 Atherosclerosis of aorta: Secondary | ICD-10-CM | POA: Diagnosis not present

## 2022-05-06 DIAGNOSIS — R7309 Other abnormal glucose: Secondary | ICD-10-CM

## 2022-05-06 DIAGNOSIS — Z1329 Encounter for screening for other suspected endocrine disorder: Secondary | ICD-10-CM | POA: Diagnosis not present

## 2022-05-06 DIAGNOSIS — J302 Other seasonal allergic rhinitis: Secondary | ICD-10-CM

## 2022-05-06 DIAGNOSIS — M5441 Lumbago with sciatica, right side: Secondary | ICD-10-CM

## 2022-05-06 DIAGNOSIS — Z1389 Encounter for screening for other disorder: Secondary | ICD-10-CM | POA: Diagnosis not present

## 2022-05-06 DIAGNOSIS — Z136 Encounter for screening for cardiovascular disorders: Secondary | ICD-10-CM

## 2022-05-06 DIAGNOSIS — N529 Male erectile dysfunction, unspecified: Secondary | ICD-10-CM

## 2022-05-06 DIAGNOSIS — Z125 Encounter for screening for malignant neoplasm of prostate: Secondary | ICD-10-CM

## 2022-05-06 DIAGNOSIS — I10 Essential (primary) hypertension: Secondary | ICD-10-CM

## 2022-05-06 DIAGNOSIS — E559 Vitamin D deficiency, unspecified: Secondary | ICD-10-CM | POA: Diagnosis not present

## 2022-05-06 DIAGNOSIS — Z0001 Encounter for general adult medical examination with abnormal findings: Secondary | ICD-10-CM

## 2022-05-06 MED ORDER — GABAPENTIN 300 MG PO CAPS: 300.0000 mg | ORAL_CAPSULE | Freq: Two times a day (BID) | ORAL | 2 refills | Status: AC | PRN

## 2022-05-06 MED ORDER — LORATADINE 10 MG PO TABS: ORAL_TABLET | ORAL | 0 refills | Status: DC

## 2022-05-06 MED ORDER — SILDENAFIL CITRATE 100 MG PO TABS: ORAL_TABLET | ORAL | 0 refills | Status: DC

## 2022-05-06 MED ORDER — TAMSULOSIN HCL 0.4 MG PO CAPS: 0.4000 mg | ORAL_CAPSULE | Freq: Every day | ORAL | 3 refills | Status: DC

## 2022-05-06 MED ORDER — FLUTICASONE PROPIONATE 50 MCG/ACT NA SUSP: 2.0000 | Freq: Every day | NASAL | 3 refills | Status: AC

## 2022-05-06 MED ORDER — ROSUVASTATIN CALCIUM 20 MG PO TABS: 20.0000 mg | ORAL_TABLET | Freq: Every day | ORAL | 11 refills | Status: AC

## 2022-05-06 NOTE — Patient Instructions (Signed)

## 2022-05-07 LAB — URINALYSIS, ROUTINE W REFLEX MICROSCOPIC
Bacteria, UA: NONE SEEN /HPF
Bilirubin Urine: NEGATIVE
Glucose, UA: NEGATIVE
Hgb urine dipstick: NEGATIVE
Hyaline Cast: NONE SEEN /LPF
Ketones, ur: NEGATIVE
Nitrite: NEGATIVE
Protein, ur: NEGATIVE
RBC / HPF: NONE SEEN /HPF (ref 0–2)
Specific Gravity, Urine: 1.006 (ref 1.001–1.035)
Squamous Epithelial / HPF: NONE SEEN /HPF (ref <=5)
WBC, UA: NONE SEEN /HPF (ref 0–5)
pH: 7.5 (ref 5.0–8.0)

## 2022-05-07 LAB — COMPLETE METABOLIC PANEL WITH GFR
AG Ratio: 1.8 (calc) (ref 1.0–2.5)
ALT: 13 U/L (ref 9–46)
AST: 22 U/L (ref 10–35)
Albumin: 4.5 g/dL (ref 3.6–5.1)
Alkaline phosphatase (APISO): 66 U/L (ref 35–144)
BUN: 9 mg/dL (ref 7–25)
CO2: 27 mmol/L (ref 20–32)
Calcium: 9.6 mg/dL (ref 8.6–10.3)
Chloride: 102 mmol/L (ref 98–110)
Creat: 0.92 mg/dL (ref 0.70–1.35)
Globulin: 2.5 g/dL (calc) (ref 1.9–3.7)
Glucose, Bld: 109 mg/dL — ABNORMAL HIGH (ref 65–99)
Potassium: 3.3 mmol/L — ABNORMAL LOW (ref 3.5–5.3)
Sodium: 140 mmol/L (ref 135–146)
Total Bilirubin: 0.8 mg/dL (ref 0.2–1.2)
Total Protein: 7 g/dL (ref 6.1–8.1)
eGFR: 92 mL/min/{1.73_m2} (ref >=60)

## 2022-05-07 LAB — PSA: PSA: 2.58 ng/mL (ref <=4.00)

## 2022-05-07 LAB — CBC WITH DIFFERENTIAL/PLATELET
Absolute Monocytes: 540 cells/uL (ref 200–950)
Basophils Absolute: 41 cells/uL (ref 0–200)
Basophils Relative: 0.9 %
Eosinophils Absolute: 198 cells/uL (ref 15–500)
Eosinophils Relative: 4.4 %
HCT: 43.5 % (ref 38.5–50.0)
Hemoglobin: 14.7 g/dL (ref 13.2–17.1)
Lymphs Abs: 1935 cells/uL (ref 850–3900)
MCH: 30.1 pg (ref 27.0–33.0)
MCHC: 33.8 g/dL (ref 32.0–36.0)
MCV: 89.1 fL (ref 80.0–100.0)
MPV: 11.7 fL (ref 7.5–12.5)
Monocytes Relative: 12 %
Neutro Abs: 1787 cells/uL (ref 1500–7800)
Neutrophils Relative %: 39.7 %
Platelets: 153 10*3/uL (ref 140–400)
RBC: 4.88 10*6/uL (ref 4.20–5.80)
RDW: 13.2 % (ref 11.0–15.0)
Total Lymphocyte: 43 %
WBC: 4.5 10*3/uL (ref 3.8–10.8)

## 2022-05-07 LAB — LIPID PANEL
Cholesterol: 167 mg/dL (ref <200)
HDL: 41 mg/dL (ref >=40)
LDL Cholesterol (Calc): 105 mg/dL (calc) — ABNORMAL HIGH
Non-HDL Cholesterol (Calc): 126 mg/dL (calc) (ref <130)
Total CHOL/HDL Ratio: 4.1 (calc) (ref <5.0)
Triglycerides: 109 mg/dL (ref <150)

## 2022-05-07 LAB — VITAMIN D 25 HYDROXY (VIT D DEFICIENCY, FRACTURES): Vit D, 25-Hydroxy: 61 ng/mL (ref 30–100)

## 2022-05-07 LAB — HEMOGLOBIN A1C
Hgb A1c MFr Bld: 5.8 % of total Hgb — ABNORMAL HIGH (ref <5.7)
Mean Plasma Glucose: 120 mg/dL
eAG (mmol/L): 6.6 mmol/L

## 2022-05-07 LAB — MICROSCOPIC MESSAGE

## 2022-05-07 LAB — MICROALBUMIN / CREATININE URINE RATIO
Creatinine, Urine: 56 mg/dL (ref 20–320)
Microalb, Ur: <0.2 mg/dL

## 2022-05-07 LAB — MAGNESIUM: Magnesium: 1.8 mg/dL (ref 1.5–2.5)

## 2022-05-07 LAB — TSH: TSH: 1.25 mIU/L (ref 0.40–4.50)

## 2022-05-20 ENCOUNTER — Other Ambulatory Visit: Payer: Self-pay

## 2022-05-20 ENCOUNTER — Encounter: Payer: Self-pay | Admitting: Nurse Practitioner

## 2022-05-20 ENCOUNTER — Ambulatory Visit (INDEPENDENT_AMBULATORY_CARE_PROVIDER_SITE_OTHER): Payer: PPO | Admitting: Nurse Practitioner

## 2022-05-20 VITALS — BP 158/90 | HR 64 | Temp 98.1°F | Ht 68.0 in | Wt 208.6 lb

## 2022-05-20 DIAGNOSIS — J302 Other seasonal allergic rhinitis: Secondary | ICD-10-CM

## 2022-05-20 DIAGNOSIS — K219 Gastro-esophageal reflux disease without esophagitis: Secondary | ICD-10-CM | POA: Diagnosis not present

## 2022-05-20 DIAGNOSIS — R051 Acute cough: Secondary | ICD-10-CM

## 2022-05-20 DIAGNOSIS — R490 Dysphonia: Secondary | ICD-10-CM | POA: Diagnosis not present

## 2022-05-20 NOTE — Progress Notes (Signed)
Assessment and Plan:  Caleb Johns was seen today for an episodic visit.  Diagnoses and all order for this visit:  Chronic seasonal allergic rhinitis Instructed daily use of antihistamine OTC Avoid triggers  Gastroesophageal reflux disease, unspecified whether esophagitis present Instructed daily use of famotidine. No suspected reflux complications ([place]/stricture). Lifestyle modification:  wt loss, avoid meals 2-3h before bedtime. Consider eliminating food triggers:  chocolate, caffeine, EtOH, acid/spicy food.  Acute cough/hoarse Mostly likely secondary to acid reflux and allergies Avoid triggers Stay well hydrated. Continue to monitor  Notify office for further evaluation and treatment, questions or concerns if s/s fail to improve. The risks and benefits of my recommendations, as well as other treatment options were discussed with the patient today. Questions were answered.  Further disposition pending results of labs. Discussed med's effects and SE's.    Over 15 minutes of exam, counseling, chart review, and critical decision making was performed.   Future Appointments  Date Time Provider [place]  08/06/2022  4:00 PM Alycia Rossetti, NP [facility] None  05/06/2023  2:00 PM Alycia Rossetti, NP [facility] None    ------------------------------------------------------------------------------------------------------------------   HPI BP (!) 160/100   Pulse 64   Temp 98.1 F (36.7 C)   Ht 5\' 8"  (1.727 m)   Wt 208 lb 9.6 oz (94.6 kg)   SpO2 97%   BMI 31.72 kg/m   67 y.o.male presents for evaluation of hoarseness with cough over the last 2 days. Denies any other symptoms including nasal congestion, fever, fatigue.  States that he does have a hx of GERD, has not been taking any medications for treatment.  Denies any difficulty swallowing.  He does not take a daily antihistamine.    Past Medical History:  Diagnosis Date   GERD (gastroesophageal reflux  disease)    Gout    HSV-1 (herpes simplex virus 1) infection    HSV-2 (herpes simplex virus 2) infection    Hyperlipidemia    Hypertension    Hypogonadism male    Obese    Pneumonia    walking -many years ago   Pre-diabetes      Allergies  Allergen Reactions   Ace Inhibitors     REACTION: cough   Trilipix [Choline Fenofibrate] Other (See Comments)    Heart burn    Current Outpatient Medications on File Prior to Visit  Medication Sig   allopurinol (ZYLOPRIM) 300 MG tablet Take  1 tablet   Daily  to Prevent Gout                                             /                                         TAKE                                              BY                                   MOUTH  ONCE DAILY   alum & mag hydroxide-simeth (MAALOX/MYLANTA) 200-200-20 MG/5ML suspension Take 30 mLs by mouth 2 (two) times daily.   Ascorbic Acid (VITAMIN C) 1000 MG tablet Take 1,000 mg by mouth daily.   aspirin EC 81 MG tablet Take 81 mg by mouth daily.   atenolol (TENORMIN) 100 MG tablet Take  1 tablet  Daily for BP                                          /                                 Take                                    by                                        mouth                                  once daily   B Complex-C (SUPER B COMPLEX PO) Take 1 tablet by mouth daily.   celecoxib (CELEBREX) 100 MG capsule Take 1 capsule by mouth once daily   Cholecalciferol (VITAMIN D) 2000 UNITS CAPS Take 4,000 Units by mouth daily.   Flaxseed, Linseed, (FLAX SEED OIL) 1000 MG CAPS Take 1,000 mg by mouth daily.   fluticasone (FLONASE) 50 MCG/ACT nasal spray Place 2 sprays into both nostrils daily.   gabapentin (NEURONTIN) 300 MG capsule Take 1 capsule (300 mg total) by mouth 2 (two) times daily as needed.   hydrochlorothiazide (HYDRODIURIL) 25 MG tablet Take  1 tablet  Daily  for BP & Fluid Retention /Ankle Swelling                                       /                                      TAKE                                    BY                                    MOUTH   loratadine (EQ LORATADINE) 10 MG tablet TAKE 1 TABLET BY MOUTH DAILY FOR ALLERGIES   Omega-3 Fatty Acids (FISH OIL) 1000 MG CAPS Take 1,000 mg by mouth daily.   rosuvastatin (CRESTOR) 20 MG tablet Take 1 tablet (20 mg total) by mouth daily.   sildenafil (VIAGRA) 100 MG tablet TAKE HALF TO ONE TABLET BY MOUTH DAILY AS NEEDED FOR XXX   tamsulosin (FLOMAX) 0.4 MG CAPS capsule Take 1 capsule (0.4 mg total) by mouth daily.  famotidine (PEPCID) 40 MG tablet Take 1 tablet (40 mg total) by mouth every evening.   No current facility-administered medications on file prior to visit.    ROS: all negative except what is noted in the HPI.   Physical Exam:  BP (!) 160/100   Pulse 64   Temp 98.1 F (36.7 C)   Ht 5\' 8"  (1.727 m)   Wt 208 lb 9.6 oz (94.6 kg)   SpO2 97%   BMI 31.72 kg/m   General Appearance: NAD.  Awake, conversant and cooperative. Eyes: PERRLA, EOMs intact.  Sclera white.  Conjunctiva without erythema. Sinuses: No frontal/maxillary tenderness.  No nasal discharge. Nares patent.  ENT/Mouth: Ext aud canals clear.  Bilateral TMs w/DOL and without erythema or bulging. Hearing intact.  Posterior pharynx without swelling or exudate.  Tonsils without swelling or erythema.  Neck: Supple.  No masses, nodules or thyromegaly. Respiratory: Effort is regular with non-labored breathing. Breath sounds are equal bilaterally without rales, rhonchi, wheezing or stridor.  Cardio: RRR with no MRGs. Brisk peripheral pulses without edema.  Abdomen: Active BS in all four quadrants.  Soft and non-tender without guarding, rebound tenderness, hernias or masses. Lymphatics: Non tender without lymphadenopathy.  Musculoskeletal: Full ROM, 5/5 strength, normal ambulation.  No clubbing or cyanosis. Skin: Appropriate color for ethnicity. Warm without rashes, lesions, ecchymosis, ulcers.  Neuro: CN  II-XII grossly normal. Normal muscle tone without cerebellar symptoms and intact sensation.   Psych: AO X 3,  appropriate mood and affect, insight and judgment.     Darrol Jump, NP 4:35 PM [facility]

## 2022-05-26 ENCOUNTER — Other Ambulatory Visit: Payer: Self-pay | Admitting: Nurse Practitioner

## 2022-05-26 DIAGNOSIS — B009 Herpesviral infection, unspecified: Secondary | ICD-10-CM

## 2022-08-04 ENCOUNTER — Other Ambulatory Visit: Payer: Self-pay | Admitting: Nurse Practitioner

## 2022-08-04 DIAGNOSIS — N529 Male erectile dysfunction, unspecified: Secondary | ICD-10-CM

## 2022-08-05 NOTE — Progress Notes (Signed)
WELCOME TO MEDICARE AND FOLLOW UP Assessment:  [name] was seen today for follow-up and medicare wellness.  Diagnoses and all orders for this visit:  Welcome to Medicare preventive visit Due yearly Vaccines reviewed  Essential hypertension - continue medications, DASH diet, exercise and monitor at home. Call if greater than 130/80.   -     CBC with Differential/Platelet  Hyperlipidemia, mixed Was given Rosuvastatin but gave him muscle pain -     COMPLETE METABOLIC PANEL WITH GFR -     Lipid panel  Abnormal glucose Continue diet and exercise -     COMPLETE METABOLIC PANEL WITH GFR  Idiopathic gout, unspecified chronicity, unspecified site No current flares Continue Allopurinol daily  Vitamin D deficiency Continue Vit D supplementation to maintain value in therapeutic level of 60-100   Medication management -     CBC with Differential/Platelet -     COMPLETE METABOLIC PANEL WITH GFR -     Magnesium -     Lipid panel  Gastroesophageal reflux disease, unspecified whether esophagitis present  -     Magnesium  Class 1 obesity due to excess calories with serious comorbidity and body mass index (BMI) of 31.0 to 31.9 in adult Overweight Long discussion about weight loss, diet, and exercise Recommended diet heavy in fruits and veggies and low in animal meats, cheeses, and dairy products, appropriate calorie intake Patient will work on decreasing saturated fats, simple carbs and increase lean protein and exercise Follow up at next visit  Benign prostatic hyperplasia with lower urinary tract symptoms, symptom details unspecified No longer taking Flomax Continue to monitor      Over 30 minutes of exam, counseling, chart review, and critical decision making was performed  Future Appointments  Date Time Provider Department Center  05/06/2023  2:00 PM Raynelle Dick, NP [facility] None     Plan:   During the course of the visit the patient was educated and counseled  about appropriate screening and preventive services including:   Pneumococcal vaccine  Influenza vaccine Prevnar 13 Td vaccine Screening electrocardiogram Colorectal cancer screening Diabetes screening Glaucoma screening Nutrition counseling    Subjective:  Caleb Johns is a 67 y.o. male who presents for Medicare Annual Wellness Visit and 3 month follow up for HTN, hyperlipidemia, abnormal glucose, and vitamin D Def.   Currently on Tenormin 100 mg and HCTZ 25 mg daily for BP. His blood pressure has been controlled at home, today their BP is BP: 130/78 BP Readings from Last 3 Encounters:  08/06/22 130/78  05/20/22 (!) 158/90  05/06/22 122/72  He does workout, mows grass. He denies chest pain, shortness of breath, dizziness.   BMI is Body mass index is 31.08 kg/m., he has been working on diet and exercise. Wt Readings from Last 3 Encounters:  08/06/22 204 lb 6.4 oz (92.7 kg)  05/20/22 208 lb 9.6 oz (94.6 kg)  05/06/22 205 lb (93 kg)    He was prescribed Rosuvastatin but developed muscle aches.His cholesterol is not at goal. The cholesterol last visit was:   Lab Results  Component Value Date   CHOL 167 05/06/2022   HDL 41 05/06/2022   LDLCALC 105 (H) 05/06/2022   TRIG 109 05/06/2022   CHOLHDL 4.1 05/06/2022   He is trying diet and exercise to decrease abnormal glucose. Last A1C in the office was:  Lab Results  Component Value Date   HGBA1C 5.8 (H) 05/06/2022   Last GFR Lab Results  Component Value Date  EGFR 92 05/06/2022   Patient is on Vitamin D supplement.   Lab Results  Component Value Date   VD25OH 61 05/06/2022         Medication Review:   Current Outpatient Medications (Cardiovascular):    atenolol (TENORMIN) 100 MG tablet, Take  1 tablet  Daily for BP                                          /                                 Take                                    by                                        mouth                                  once  daily   hydrochlorothiazide (HYDRODIURIL) 25 MG tablet, Take  1 tablet  Daily  for BP & Fluid Retention /Ankle Swelling                                       /                                     TAKE                                    BY                                    MOUTH   sildenafil (VIAGRA) 100 MG tablet, TAKE HALF TO ONE TABLET BY MOUTH DAILY AS NEEDED FOR SEX   rosuvastatin (CRESTOR) 20 MG tablet, Take 1 tablet (20 mg total) by mouth daily. (Patient not taking: Reported on 08/06/2022)  Current Outpatient Medications (Respiratory):    fluticasone (FLONASE) 50 MCG/ACT nasal spray, Place 2 sprays into both nostrils daily.   loratadine (EQ LORATADINE) 10 MG tablet, TAKE 1 TABLET BY MOUTH DAILY FOR ALLERGIES  Current Outpatient Medications (Analgesics):    allopurinol (ZYLOPRIM) 300 MG tablet, Take  1 tablet   Daily  to Prevent Gout                                             /                                         TAKE  BY                                   MOUTH                                      ONCE DAILY   aspirin EC 81 MG tablet, Take 81 mg by mouth daily.   celecoxib (CELEBREX) 100 MG capsule, Take 1 capsule by mouth once daily (Patient not taking: Reported on 08/06/2022)   Current Outpatient Medications (Other):    acyclovir (ZOVIRAX) 800 MG tablet, TAKE 1 TABLET BY MOUTH ONCE DAILY TO  PREVENT  FEVER  BLISTER  AND  COLD  SORES   alum & mag hydroxide-simeth (MAALOX/MYLANTA) 200-200-20 MG/5ML suspension, Take 30 mLs by mouth 2 (two) times daily.   Ascorbic Acid (VITAMIN C) 1000 MG tablet, Take 1,000 mg by mouth daily.   B Complex-C (SUPER B COMPLEX PO), Take 1 tablet by mouth daily.   Cholecalciferol (VITAMIN D) 2000 UNITS CAPS, Take 4,000 Units by mouth daily.   Flaxseed, Linseed, (FLAX SEED OIL) 1000 MG CAPS, Take 1,000 mg by mouth daily.   gabapentin (NEURONTIN) 300 MG capsule, Take 1 capsule (300 mg total) by mouth 2 (two) times  daily as needed.   Omega-3 Fatty Acids (FISH OIL) 1000 MG CAPS, Take 1,000 mg by mouth daily.   famotidine (PEPCID) 40 MG tablet, Take 1 tablet (40 mg total) by mouth every evening.   tamsulosin (FLOMAX) 0.4 MG CAPS capsule, Take 1 capsule (0.4 mg total) by mouth daily. (Patient not taking: Reported on 08/06/2022)  Allergies: Allergies  Allergen Reactions   Ace Inhibitors     REACTION: cough   Trilipix [Choline Fenofibrate] Other (See Comments)    Heart burn    Current Problems (verified) has Hyperlipidemia; Essential hypertension; GERD; Vitamin D deficiency; Other abnormal glucose (hx of prediabetes); Medication management; Overweight (BMI 25.0-29.9); and Gout on their problem list.  Screening Tests Immunization History  Administered Date(s) Administered   Influenza, High Dose Seasonal PF 11/13/2021   PFIZER(Purple Top)SARS-COV-2 Vaccination 04/29/2019, 05/31/2019   PNEUMOCOCCAL CONJUGATE-20 05/05/2021   PPD Test 02/13/2014   Pfizer Covid-19 Vaccine Bivalent Booster 85yrs & up 02/26/2020   Pneumococcal Polysaccharide-23 02/23/2002   Tdap 05/15/2009, 05/02/2020    Health Maintenance  Topic Date Due   COVID-19 Vaccine (4 - 2023-24 season) 08/22/2022 (Originally 10/24/2021)   Zoster Vaccines- Shingrix (1 of 2) 11/06/2022 (Originally 03/17/1974)   INFLUENZA VACCINE  09/24/2022   Medicare Annual Wellness (AWV)  08/06/2023   Colonoscopy  09/19/2024   DTaP/Tdap/Td (3 - Td or Tdap) 05/03/2030   Pneumonia Vaccine 5+ Years old  Completed   Hepatitis C Screening  Completed   HPV VACCINES  Aged Out     Names of Other Physician/Practitioners you currently use: 1. [facility] here for primary care 2.  eye doctor, last visit 2024 3., dentist, last visit 2024  Patient Care Team: Lucky Cowboy, MD as PCP - General (Internal Medicine) Armbruster, Willaim Rayas, MD as Consulting Physician (Gastroenterology)  Surgical: He  has a past surgical history  that includes Back surgery (2012); Laparoscopic inguinal hernia with umbilical hernia (Right, 08/15/2013); Insertion of mesh (N/A, 08/15/2013); Hernia repair; Vitrectomy 25 gauge with scleral buckle (Right, 08/30/2020); Photocoagulation with laser (Right, 08/30/2020); Gas insertion (Right, 08/30/2020); Gas/fluid exchange (Right,  08/30/2020); and Perfluorone injection (Right, 08/30/2020). Family His family history includes Colon cancer (age of onset: 49) in his brother; Diabetes in his mother; Hypertension in his brother, brother, brother, and mother. Social history  He reports that he quit smoking about 20 years ago. His smoking use included cigarettes. He has never used smokeless tobacco. He reports that he does not drink alcohol and does not use drugs.  MEDICARE WELLNESS OBJECTIVES: Physical activity: Current Exercise Habits: The patient does not participate in regular exercise at present, Time (Minutes): 30, Frequency (Times/Week): 5, Weekly Exercise (Minutes/Week): 150, Intensity: Mild, Exercise limited by: None identified Cardiac risk factors: Cardiac Risk Factors include: advanced age (>42men, >18 women);dyslipidemia;hypertension;obesity (BMI >30kg/m2) Depression/mood screen:      08/06/2022    4:29 PM  Depression screen PHQ 2/9  Decreased Interest 0  Down, Depressed, Hopeless 0  PHQ - 2 Score 0    ADLs:     08/06/2022    4:29 PM  In your present state of health, do you have any difficulty performing the following activities:  Hearing? 0  Vision? 0  Difficulty concentrating or making decisions? 0  Walking or climbing stairs? 0  Dressing or bathing? 0  Doing errands, shopping? 0     Cognitive Testing  Alert? Yes  Normal Appearance?Yes  Oriented to person? Yes  Place? Yes   Time? Yes  Recall of three objects?  Yes  Can perform simple calculations? Yes  Displays appropriate judgment?Yes  Can read the correct time from a watch face?Yes  EOL planning: Does Patient Have a Medical Advance  Directive?: No Does patient want to make changes to medical advance directive?: No - Patient declined   Objective:   Today's Vitals   08/06/22 1615  BP: 130/78  Pulse: (!) 51  Temp: 97.8 F (36.6 C)  SpO2: 98%  Weight: 204 lb 6.4 oz (92.7 kg)  Height: 5\' 8"  (1.727 m)   Body mass index is 31.08 kg/m.  General appearance: alert, no distress, WD/WN, male HEENT: normocephalic, sclerae anicteric, TMs pearly, nares patent, no discharge or erythema, pharynx normal Oral cavity: MMM, no lesions Neck: supple, no lymphadenopathy, no thyromegaly, no masses Heart: RRR, normal S1, S2, no murmurs Lungs: CTA bilaterally, no wheezes, rhonchi, or rales Abdomen: +bs, soft, non tender, non distended, no masses, no hepatomegaly, no splenomegaly Musculoskeletal: nontender, no swelling, no obvious deformity Extremities: no edema, no cyanosis, no clubbing Pulses: 2+ symmetric, upper and lower extremities, normal cap refill Neurological: alert, oriented x 3, CN2-12 intact, strength normal upper extremities and lower extremities, sensation normal throughout, DTRs 2+ throughout, no cerebellar signs, gait normal Psychiatric: normal affect, behavior normal, pleasant   Medicare Attestation I have personally reviewed: The patient's medical and social history Their use of alcohol, tobacco or illicit drugs Their current medications and supplements The patient's functional ability including ADLs,fall risks, home safety risks, cognitive, and hearing and visual impairment Diet and physical activities Evidence for depression or mood disorders  The patient's weight, height, BMI, and visual acuity have been recorded in the chart.  I have made referrals, counseling, and provided education to the patient based on review of the above and I have provided the patient with a written personalized care plan for preventive services.     Raynelle Dick, NP   08/06/2022

## 2022-08-06 ENCOUNTER — Ambulatory Visit (INDEPENDENT_AMBULATORY_CARE_PROVIDER_SITE_OTHER): Payer: Medicare HMO | Admitting: Nurse Practitioner

## 2022-08-06 ENCOUNTER — Encounter: Payer: Self-pay | Admitting: Nurse Practitioner

## 2022-08-06 VITALS — BP 130/78 | HR 51 | Temp 97.8°F | Ht 68.0 in | Wt 204.4 lb

## 2022-08-06 DIAGNOSIS — Z6831 Body mass index (BMI) 31.0-31.9, adult: Secondary | ICD-10-CM | POA: Diagnosis not present

## 2022-08-06 DIAGNOSIS — E6609 Other obesity due to excess calories: Secondary | ICD-10-CM | POA: Diagnosis not present

## 2022-08-06 DIAGNOSIS — Z0000 Encounter for general adult medical examination without abnormal findings: Secondary | ICD-10-CM

## 2022-08-06 DIAGNOSIS — E782 Mixed hyperlipidemia: Secondary | ICD-10-CM

## 2022-08-06 DIAGNOSIS — Z0001 Encounter for general adult medical examination with abnormal findings: Secondary | ICD-10-CM | POA: Diagnosis not present

## 2022-08-06 DIAGNOSIS — I10 Essential (primary) hypertension: Secondary | ICD-10-CM

## 2022-08-06 DIAGNOSIS — R6889 Other general symptoms and signs: Secondary | ICD-10-CM | POA: Diagnosis not present

## 2022-08-06 DIAGNOSIS — R7309 Other abnormal glucose: Secondary | ICD-10-CM

## 2022-08-06 DIAGNOSIS — Z79899 Other long term (current) drug therapy: Secondary | ICD-10-CM | POA: Diagnosis not present

## 2022-08-06 DIAGNOSIS — N401 Enlarged prostate with lower urinary tract symptoms: Secondary | ICD-10-CM

## 2022-08-06 DIAGNOSIS — E559 Vitamin D deficiency, unspecified: Secondary | ICD-10-CM

## 2022-08-06 DIAGNOSIS — K219 Gastro-esophageal reflux disease without esophagitis: Secondary | ICD-10-CM

## 2022-08-06 DIAGNOSIS — Z136 Encounter for screening for cardiovascular disorders: Secondary | ICD-10-CM

## 2022-08-06 DIAGNOSIS — M1000 Idiopathic gout, unspecified site: Secondary | ICD-10-CM

## 2022-08-06 NOTE — Patient Instructions (Signed)

## 2022-08-07 LAB — COMPLETE METABOLIC PANEL WITH GFR
AG Ratio: 1.6 (calc) (ref 1.0–2.5)
ALT: 11 U/L (ref 9–46)
AST: 21 U/L (ref 10–35)
Albumin: 4.1 g/dL (ref 3.6–5.1)
Alkaline phosphatase (APISO): 75 U/L (ref 35–144)
BUN: 12 mg/dL (ref 7–25)
CO2: 31 mmol/L (ref 20–32)
Calcium: 9.5 mg/dL (ref 8.6–10.3)
Chloride: 100 mmol/L (ref 98–110)
Creat: 1.04 mg/dL (ref 0.70–1.35)
Globulin: 2.5 g/dL (calc) (ref 1.9–3.7)
Glucose, Bld: 99 mg/dL (ref 65–99)
Potassium: 3.5 mmol/L (ref 3.5–5.3)
Sodium: 140 mmol/L (ref 135–146)
Total Bilirubin: 0.6 mg/dL (ref 0.2–1.2)
Total Protein: 6.6 g/dL (ref 6.1–8.1)
eGFR: 79 mL/min/{1.73_m2} (ref >=60)

## 2022-08-07 LAB — CBC WITH DIFFERENTIAL/PLATELET
Absolute Monocytes: 500 cells/uL (ref 200–950)
Basophils Absolute: 30 cells/uL (ref 0–200)
Basophils Relative: 0.6 %
Eosinophils Absolute: 260 cells/uL (ref 15–500)
Eosinophils Relative: 5.2 %
HCT: 42.3 % (ref 38.5–50.0)
Hemoglobin: 14.2 g/dL (ref 13.2–17.1)
Lymphs Abs: 2330 cells/uL (ref 850–3900)
MCH: 30.5 pg (ref 27.0–33.0)
MCHC: 33.6 g/dL (ref 32.0–36.0)
MCV: 90.8 fL (ref 80.0–100.0)
MPV: 11.5 fL (ref 7.5–12.5)
Monocytes Relative: 10 %
Neutro Abs: 1880 cells/uL (ref 1500–7800)
Neutrophils Relative %: 37.6 %
Platelets: 137 10*3/uL — ABNORMAL LOW (ref 140–400)
RBC: 4.66 10*6/uL (ref 4.20–5.80)
RDW: 13.8 % (ref 11.0–15.0)
Total Lymphocyte: 46.6 %
WBC: 5 10*3/uL (ref 3.8–10.8)

## 2022-08-07 LAB — LIPID PANEL
Cholesterol: 155 mg/dL (ref <200)
HDL: 32 mg/dL — ABNORMAL LOW (ref >=40)
LDL Cholesterol (Calc): 81 mg/dL (calc)
Non-HDL Cholesterol (Calc): 123 mg/dL (calc) (ref <130)
Total CHOL/HDL Ratio: 4.8 (calc) (ref <5.0)
Triglycerides: 346 mg/dL — ABNORMAL HIGH (ref <150)

## 2022-08-07 LAB — MAGNESIUM: Magnesium: 1.6 mg/dL (ref 1.5–2.5)

## 2022-08-07 NOTE — Progress Notes (Signed)
Patient is aware of lab results and instructions. -e welch

## 2022-09-29 DIAGNOSIS — Z961 Presence of intraocular lens: Secondary | ICD-10-CM | POA: Diagnosis not present

## 2022-09-29 DIAGNOSIS — E119 Type 2 diabetes mellitus without complications: Secondary | ICD-10-CM | POA: Diagnosis not present

## 2022-10-27 ENCOUNTER — Other Ambulatory Visit: Payer: Self-pay | Admitting: Nurse Practitioner

## 2022-10-27 DIAGNOSIS — N529 Male erectile dysfunction, unspecified: Secondary | ICD-10-CM

## 2022-10-30 ENCOUNTER — Encounter: Payer: Self-pay | Admitting: Nurse Practitioner

## 2022-10-30 ENCOUNTER — Ambulatory Visit (INDEPENDENT_AMBULATORY_CARE_PROVIDER_SITE_OTHER): Payer: Medicare HMO | Admitting: Nurse Practitioner

## 2022-10-30 ENCOUNTER — Ambulatory Visit
Admission: RE | Admit: 2022-10-30 | Discharge: 2022-10-30 | Disposition: A | Discharge status: Home / Self Care | Payer: Medicare HMO | Source: Ambulatory Visit | Attending: Nurse Practitioner | Admitting: Nurse Practitioner

## 2022-10-30 VITALS — BP 118/72 | HR 53 | Temp 97.7°F | Ht 68.0 in | Wt 205.2 lb

## 2022-10-30 DIAGNOSIS — L03119 Cellulitis of unspecified part of limb: Secondary | ICD-10-CM | POA: Diagnosis not present

## 2022-10-30 DIAGNOSIS — I10 Essential (primary) hypertension: Secondary | ICD-10-CM

## 2022-10-30 DIAGNOSIS — L989 Disorder of the skin and subcutaneous tissue, unspecified: Secondary | ICD-10-CM | POA: Diagnosis not present

## 2022-10-30 DIAGNOSIS — S6722XA Crushing injury of left hand, initial encounter: Secondary | ICD-10-CM

## 2022-10-30 DIAGNOSIS — M19042 Primary osteoarthritis, left hand: Secondary | ICD-10-CM | POA: Diagnosis not present

## 2022-10-30 DIAGNOSIS — S6992XA Unspecified injury of left wrist, hand and finger(s), initial encounter: Secondary | ICD-10-CM | POA: Diagnosis not present

## 2022-10-30 MED ORDER — DOXYCYCLINE HYCLATE 100 MG PO CAPS
ORAL_CAPSULE | ORAL | 0 refills | Status: DC
Start: 2022-10-30 — End: 2022-11-06

## 2022-10-30 NOTE — Patient Instructions (Signed)

## 2022-10-30 NOTE — Progress Notes (Signed)
Assessment and Plan:  Caleb Johns was seen today for acute visit.  Diagnoses and all orders for this visit:  Essential hypertension - continue medications, DASH diet, exercise and monitor at home. Call if greater than 130/80.   Crushing injury of left hand, initial encounter -     DG Hand Complete Left; Future -     DG Wrist Complete Left; Future  Cellulitis of hand Monitor and if swelling, redness and warmth do not improve or start to run a fever notify the office -     doxycycline (VIBRAMYCIN) 100 MG capsule; Take 1 capsule twice daily with food  Skin lesion of back -     Ambulatory referral to General Surgery       Further disposition pending results of labs. Discussed med's effects and SE's.   Over 30 minutes of exam, counseling, chart review, and critical decision making was performed.   Future Appointments  Date Time Provider Department Center  02/09/2023  4:00 PM Caleb Cowboy, MD [facility] None  05/06/2023  2:00 PM Caleb Dick, NP [facility] None    ------------------------------------------------------------------------------------------------------------------   HPI BP 118/72   Pulse (!) 53   Temp 97.7 F (36.5 C)   Ht 5\' 8"  (1.727 m)   Wt 205 lb 3.2 oz (93.1 kg)   SpO2 97%   BMI 31.20 kg/m   67 y.o.male presents for left hand pain and swelling. 2 days ago his lawnmower fell on his left hand. Left hand is swollen , warm and red.  He does have pain with movement of wrist.  He has also noticed on his back where previous cyst was removed it is starting to swell.    BP well controlled with Atenolol 100 mg every day, hydrochlorothiazide 25 mg QD BP Readings from Last 3 Encounters:  10/30/22 118/72  08/06/22 130/78  05/20/22 (!) 158/90  Denies headaches, chest pain, shortness of breath and dizziness   BMI is Body mass index is 31.2 kg/m., he has been working on diet and exercise. Wt Readings from Last 3 Encounters:  10/30/22 205 lb 3.2 oz (93.1 kg)   08/06/22 204 lb 6.4 oz (92.7 kg)  05/20/22 208 lb 9.6 oz (94.6 kg)      Past Medical History:  Diagnosis Date   GERD (gastroesophageal reflux disease)    Gout    HSV-1 (herpes simplex virus 1) infection    HSV-2 (herpes simplex virus 2) infection    Hyperlipidemia    Hypertension    Hypogonadism male    Obese    Pneumonia    walking -many years ago   Pre-diabetes      Allergies  Allergen Reactions   Ace Inhibitors     REACTION: cough   Trilipix [Choline Fenofibrate] Other (See Comments)    Heart burn    Current Outpatient Medications on File Prior to Visit  Medication Sig   acyclovir (ZOVIRAX) 800 MG tablet TAKE 1 TABLET BY MOUTH ONCE DAILY TO  PREVENT  FEVER  BLISTER  AND  COLD  SORES   allopurinol (ZYLOPRIM) 300 MG tablet Take  1 tablet   Daily  to Prevent Gout                                             /  TAKE                                              BY                                   MOUTH                                      ONCE DAILY   alum & mag hydroxide-simeth (MAALOX/MYLANTA) 200-200-20 MG/5ML suspension Take 30 mLs by mouth 2 (two) times daily.   Ascorbic Acid (VITAMIN C) 1000 MG tablet Take 1,000 mg by mouth daily.   aspirin EC 81 MG tablet Take 81 mg by mouth daily.   atenolol (TENORMIN) 100 MG tablet Take  1 tablet  Daily for BP                                          /                                 Take                                    by                                        mouth                                  once daily   B Complex-C (SUPER B COMPLEX PO) Take 1 tablet by mouth daily.   Cholecalciferol (VITAMIN D) 2000 UNITS CAPS Take 4,000 Units by mouth daily.   Flaxseed, Linseed, (FLAX SEED OIL) 1000 MG CAPS Take 1,000 mg by mouth daily.   fluticasone (FLONASE) 50 MCG/ACT nasal spray Place 2 sprays into both nostrils daily.   gabapentin (NEURONTIN) 300 MG capsule Take 1 capsule (300 mg total) by mouth  2 (two) times daily as needed.   hydrochlorothiazide (HYDRODIURIL) 25 MG tablet Take  1 tablet  Daily  for BP & Fluid Retention /Ankle Swelling                                       /                                     TAKE                                    BY  MOUTH   loratadine (EQ LORATADINE) 10 MG tablet TAKE 1 TABLET BY MOUTH DAILY FOR ALLERGIES   Omega-3 Fatty Acids (FISH OIL) 1000 MG CAPS Take 1,000 mg by mouth daily.   sildenafil (VIAGRA) 100 MG tablet Take 1/2 to 1 tablet Daily as needed for XXXX   celecoxib (CELEBREX) 100 MG capsule Take 1 capsule by mouth once daily (Patient not taking: Reported on 10/30/2022)   famotidine (PEPCID) 40 MG tablet Take 1 tablet (40 mg total) by mouth every evening.   rosuvastatin (CRESTOR) 20 MG tablet Take 1 tablet (20 mg total) by mouth daily. (Patient not taking: Reported on 08/06/2022)   tamsulosin (FLOMAX) 0.4 MG CAPS capsule Take 1 capsule (0.4 mg total) by mouth daily. (Patient not taking: Reported on 08/06/2022)   No current facility-administered medications on file prior to visit.    ROS: all negative except above.   Physical Exam:  BP 118/72   Pulse (!) 53   Temp 97.7 F (36.5 C)   Ht 5\' 8"  (1.727 m)   Wt 205 lb 3.2 oz (93.1 kg)   SpO2 97%   BMI 31.20 kg/m   General Appearance: Well nourished, in no apparent distress. Eyes: PERRLA, EOMs, conjunctiva no swelling or erythema Sinuses: No Frontal/maxillary tenderness ENT/Mouth: Ext aud canals clear, TMs without erythema, bulging. No erythema, swelling, or exudate on post pharynx.  Tonsils not swollen or erythematous. Hearing normal.  Neck: Supple, thyroid normal.  Respiratory: Respiratory effort normal, BS equal bilaterally without rales, rhonchi, wheezing or stridor.  Cardio: RRR with no MRGs. Brisk peripheral pulses without edema.  Abdomen: Soft, + BS.  Non tender, no guarding, rebound, hernias, masses. Lymphatics: Non tender without lymphadenopathy.   Musculoskeletal: Full ROM of all extremities except L wrist limited flexion, 5/5 strength, normal gait.  Skin: Warm, dry . Left hand swollen, erythematous, warm- 3 small scabbed areas. On back there is 8 cm incision and has a 4 cm swollen area adjacent to incision, no warmth or erythema Neuro: Cranial nerves intact. Normal muscle tone, no cerebellar symptoms. Sensation intact.  Psych: Awake and oriented X 3, normal affect, Insight and Judgment appropriate.     Caleb Dick, NP 9:42 AM [facility]

## 2022-11-06 ENCOUNTER — Encounter (HOSPITAL_COMMUNITY): Payer: Self-pay | Admitting: Emergency Medicine

## 2022-11-06 ENCOUNTER — Ambulatory Visit (HOSPITAL_COMMUNITY)
Admission: EM | Admit: 2022-11-06 | Discharge: 2022-11-06 | Disposition: A | Discharge status: Home / Self Care | Payer: Medicare HMO | Attending: Family Medicine | Admitting: Family Medicine

## 2022-11-06 DIAGNOSIS — L0291 Cutaneous abscess, unspecified: Secondary | ICD-10-CM | POA: Diagnosis not present

## 2022-11-06 MED ORDER — AMOXICILLIN-POT CLAVULANATE 875-125 MG PO TABS
1.0000 | ORAL_TABLET | Freq: Two times a day (BID) | ORAL | 0 refills | Status: AC
Start: 2022-11-06 — End: 2022-11-13

## 2022-11-06 NOTE — ED Triage Notes (Signed)
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 Had drainage and bleed. Needs work note.

## 2022-11-06 NOTE — ED Provider Notes (Signed)
[facility]    CSN: [id number] Arrival date & time: 11/06/22  3474      History   Chief Complaint Chief Complaint  Patient presents with   Abscess    HPI Caleb Johns is a 67 y.o. male.    Abscess Here for pain and swelling in mid upper back x 1 week. First was itching.  It popped last night. At first bled, but now is just purulent dc.   No f/c.  On 9/6 when he was seen for his htn, his PCP sent in a Doxy rx. The hand infection is better. Tetanus utd  Past Medical History:  Diagnosis Date   GERD (gastroesophageal reflux disease)    Gout    HSV-1 (herpes simplex virus 1) infection    HSV-2 (herpes simplex virus 2) infection    Hyperlipidemia    Hypertension    Hypogonadism male    Obese    Pneumonia    walking -many years ago   Pre-diabetes     Patient Active Problem List   Diagnosis Date Noted   Overweight (BMI 25.0-29.9) 05/28/2014   Gout 05/28/2014   Medication management 06/28/2013   Vitamin D deficiency 02/02/2013   Other abnormal glucose (hx of prediabetes) 02/02/2013   Hyperlipidemia 05/16/2009   Essential hypertension 05/16/2009   GERD 05/16/2009    Past Surgical History:  Procedure Laterality Date   BACK SURGERY  2012   on back-hernia   GAS INSERTION Right 08/30/2020   Procedure: INSERTION OF GAS - C3F8;  Surgeon: Rennis Chris, MD;  Location: [facility] OR;  Service: Ophthalmology;  Laterality: Right;   GAS/FLUID EXCHANGE Right 08/30/2020   Procedure: GAS/FLUID EXCHANGE;  Surgeon: Rennis Chris, MD;  Location: [facility] OR;  Service: Ophthalmology;  Laterality: Right;   HERNIA REPAIR     INSERTION OF MESH N/A 08/15/2013   Procedure: INSERTION OF MESH;  Surgeon: Adolph Pollack, MD;  Location: [facility] ORS;  Service: General;  Laterality: N/A;   LAPAROSCOPIC INGUINAL HERNIA WITH UMBILICAL HERNIA Right 08/15/2013   Procedure: LAPAROSCOPIC RIGH  INGUINAL HERNIA WITH OPEN UMBILICAL HERNIA;  Surgeon: Adolph Pollack, MD;  Location: [facility] ORS;  Service:  General;  Laterality: Right;  umbilical repair with mesh   PERFLUORONE INJECTION Right 08/30/2020   Procedure: PERFLUORONE INJECTION;  Surgeon: Rennis Chris, MD;  Location: [facility] OR;  Service: Ophthalmology;  Laterality: Right;   PHOTOCOAGULATION WITH LASER Right 08/30/2020   Procedure: PHOTOCOAGULATION WITH LASER;  Surgeon: Rennis Chris, MD;  Location: [facility] OR;  Service: Ophthalmology;  Laterality: Right;   VITRECTOMY 25 GAUGE WITH SCLERAL BUCKLE Right 08/30/2020   Procedure: SCLERAL BUCKLE WITH 25 GAUGE PARS PLANA VITRECTOMY;  Surgeon: Rennis Chris, MD;  Location: [facility] OR;  Service: Ophthalmology;  Laterality: Right;       Home Medications    Prior to Admission medications   Medication Sig Start Date End Date Taking? Authorizing Provider  amoxicillin-clavulanate (AUGMENTIN) 875-125 MG tablet Take 1 tablet by mouth 2 (two) times daily for 7 days. 11/06/22 11/13/22 Yes Zenia Resides, MD  acyclovir (ZOVIRAX) 800 MG tablet TAKE 1 TABLET BY MOUTH ONCE DAILY TO  PREVENT  FEVER  BLISTER  AND  COLD  SORES 05/27/22   Raynelle Dick, NP  allopurinol (ZYLOPRIM) 300 MG tablet Take  1 tablet   Daily  to Prevent Gout                                             /  TAKE                                              BY                                   MOUTH                                      ONCE DAILY 01/18/22   Lucky Cowboy, MD  alum & mag hydroxide-simeth (MAALOX/MYLANTA) 200-200-20 MG/5ML suspension Take 30 mLs by mouth 2 (two) times daily.    [provider]  Ascorbic Acid (VITAMIN C) 1000 MG tablet Take 1,000 mg by mouth daily.    [provider]  aspirin EC 81 MG tablet Take 81 mg by mouth daily.    [provider]  atenolol (TENORMIN) 100 MG tablet Take  1 tablet  Daily for BP                                          /                                 Take                                    by                                        mouth                                   once daily 01/18/22   Lucky Cowboy, MD  B Complex-C (SUPER B COMPLEX PO) Take 1 tablet by mouth daily.    [provider]  Cholecalciferol (VITAMIN D) 2000 UNITS CAPS Take 4,000 Units by mouth daily.    [provider]  famotidine (PEPCID) 40 MG tablet Take 1 tablet (40 mg total) by mouth every evening. 05/02/20 05/02/21  McClanahan, Bella Kennedy, NP  Flaxseed, Linseed, (FLAX SEED OIL) 1000 MG CAPS Take 1,000 mg by mouth daily.    [provider]  fluticasone (FLONASE) 50 MCG/ACT nasal spray Place 2 sprays into both nostrils daily. 05/06/22   Raynelle Dick, NP  gabapentin (NEURONTIN) 300 MG capsule Take 1 capsule (300 mg total) by mouth 2 (two) times daily as needed. 05/06/22 05/06/23  Raynelle Dick, NP  hydrochlorothiazide (HYDRODIURIL) 25 MG tablet Take  1 tablet  Daily  for BP & Fluid Retention /Ankle Swelling                                       /  TAKE                                    BY                                    MOUTH 01/18/22   Lucky Cowboy, MD  loratadine (EQ LORATADINE) 10 MG tablet TAKE 1 TABLET BY MOUTH DAILY FOR ALLERGIES 05/06/22   Raynelle Dick, NP  Omega-3 Fatty Acids (FISH OIL) 1000 MG CAPS Take 1,000 mg by mouth daily.    [provider]  rosuvastatin (CRESTOR) 20 MG tablet Take 1 tablet (20 mg total) by mouth daily. Patient not taking: Reported on 08/06/2022 05/06/22 05/06/23  Raynelle Dick, NP  sildenafil (VIAGRA) 100 MG tablet Take 1/2 to 1 tablet Daily as needed for XXXX 10/27/22   Lucky Cowboy, MD    Family History Family History  Problem Relation Age of Onset   Diabetes Mother    Hypertension Mother    Hypertension Brother    Hypertension Brother    Hypertension Brother    Colon cancer Brother 48   Colon polyps Neg Hx    Esophageal cancer Neg Hx    Rectal cancer Neg Hx    Stomach cancer Neg Hx     Social History Social History   Tobacco  Use   Smoking status: Former    Current packs/day: 0.00    Types: Cigarettes    Quit date: 02/23/2002    Years since quitting: 20.7   Smokeless tobacco: Never  Vaping Use   Vaping status: Never Used  Substance Use Topics   Alcohol use: No   Drug use: No     Allergies   Ace inhibitors and Trilipix [choline fenofibrate]   Review of Systems Review of Systems   Physical Exam Triage Vital Signs ED Triage Vitals  Encounter Vitals Group     BP 11/06/22 0943 (!) 153/84     Systolic BP Percentile --      Diastolic BP Percentile --      Pulse Rate 11/06/22 0943 (!) 50     Resp 11/06/22 0943 18     Temp 11/06/22 0943 98.4 F (36.9 C)     Temp Source 11/06/22 0943 Oral     SpO2 11/06/22 0943 96 %     Weight --      Height --      Head Circumference --      Peak Flow --      Pain Score 11/06/22 0944 0     Pain Loc --      Pain Education --      Exclude from Growth Chart --    No data found.  Updated Vital Signs BP (!) 153/84 (BP Location: Left Arm)   Pulse (!) 50   Temp 98.4 F (36.9 C) (Oral)   Resp 18   SpO2 96%   Visual Acuity Right Eye Distance:   Left Eye Distance:   Bilateral Distance:    Right Eye Near:   Left Eye Near:    Bilateral Near:     Physical Exam Vitals reviewed.  Constitutional:      General: He is not in acute distress.    Appearance: He is not ill-appearing, toxic-appearing or diaphoretic.  HENT:     Mouth/Throat:  Mouth: Mucous membranes are moist.  Skin:    Coloration: Skin is not pale.     Comments: Left hand is not swollen now and abrasions are healing well.  There is a 0.5 cm open area on his mid thoracic area that is draining a little pus presently. Some old scar present. Is a little tender, but now swollen now.  Neurological:     General: No focal deficit present.     Mental Status: He is alert.  Psychiatric:        Behavior: Behavior normal.      UC Treatments / Results  Labs (all labs ordered are listed, but only  abnormal results are displayed) Labs Reviewed - No data to display  EKG   Radiology No results found.  Procedures Procedures (including critical care time)  Medications Ordered in UC Medications - No data to display  Initial Impression / Assessment and Plan / UC Course  I have reviewed the triage vital signs and the nursing notes.  Pertinent labs & imaging results that were available during my care of the patient were reviewed by me and considered in my medical decision making (see chart for details).        Augmentin is sent in as an alternative, since the sub Q abscess developed as he started taking the doxy.  He will keep his appt with surgery for possible seb cyst removal (9/23) Final Clinical Impressions(s) / UC Diagnoses   Final diagnoses:  Abscess     Discharge Instructions      Take amoxicillin-clavulanate 875 mg--1 tab twice daily with food for 7 days  Keep your appointment with the surgeon     ED Prescriptions     Medication Sig Dispense Auth. Provider   amoxicillin-clavulanate (AUGMENTIN) 875-125 MG tablet Take 1 tablet by mouth 2 (two) times daily for 7 days. 14 tablet Mukund Weinreb, Janace Aris, MD      PDMP not reviewed this encounter.   Zenia Resides, MD 11/06/22 1030

## 2022-11-06 NOTE — Discharge Instructions (Signed)
Take amoxicillin-clavulanate 875 mg--1 tab twice daily with food for 7 days  Keep your appointment with the surgeon

## 2022-11-14 ENCOUNTER — Other Ambulatory Visit: Payer: Self-pay | Admitting: Nurse Practitioner

## 2022-11-14 DIAGNOSIS — B009 Herpesviral infection, unspecified: Secondary | ICD-10-CM

## 2022-11-14 DIAGNOSIS — J302 Other seasonal allergic rhinitis: Secondary | ICD-10-CM

## 2022-11-16 ENCOUNTER — Other Ambulatory Visit: Payer: Self-pay

## 2022-11-16 DIAGNOSIS — Z872 Personal history of diseases of the skin and subcutaneous tissue: Secondary | ICD-10-CM | POA: Diagnosis not present

## 2022-11-16 DIAGNOSIS — Z9889 Other specified postprocedural states: Secondary | ICD-10-CM | POA: Diagnosis not present

## 2022-11-16 MED ORDER — CETIRIZINE HCL 10 MG PO TABS
10.0000 mg | ORAL_TABLET | Freq: Every day | ORAL | 2 refills | Status: AC
Start: 2022-11-16 — End: 2023-11-16

## 2022-12-01 ENCOUNTER — Other Ambulatory Visit: Payer: Self-pay | Admitting: Internal Medicine

## 2022-12-01 DIAGNOSIS — N529 Male erectile dysfunction, unspecified: Secondary | ICD-10-CM

## 2022-12-24 ENCOUNTER — Other Ambulatory Visit: Payer: Self-pay | Admitting: Nurse Practitioner

## 2022-12-24 DIAGNOSIS — N529 Male erectile dysfunction, unspecified: Secondary | ICD-10-CM

## 2022-12-29 ENCOUNTER — Other Ambulatory Visit: Payer: Self-pay | Admitting: Internal Medicine

## 2022-12-29 DIAGNOSIS — I10 Essential (primary) hypertension: Secondary | ICD-10-CM

## 2023-01-10 ENCOUNTER — Other Ambulatory Visit: Payer: Self-pay | Admitting: Internal Medicine

## 2023-01-10 DIAGNOSIS — M1A00X0 Idiopathic chronic gout, unspecified site, without tophus (tophi): Secondary | ICD-10-CM

## 2023-01-27 ENCOUNTER — Other Ambulatory Visit: Payer: Self-pay | Admitting: Nurse Practitioner

## 2023-01-27 DIAGNOSIS — N529 Male erectile dysfunction, unspecified: Secondary | ICD-10-CM

## 2023-02-08 ENCOUNTER — Other Ambulatory Visit: Payer: Self-pay | Admitting: Internal Medicine

## 2023-02-08 ENCOUNTER — Encounter: Payer: Self-pay | Admitting: Internal Medicine

## 2023-02-08 NOTE — Patient Instructions (Signed)

## 2023-02-08 NOTE — Progress Notes (Unsigned)
[place]      ADULT   &   ADOLESCENT      INTERNAL MEDICINE  Lucky Cowboy, M.D.          Rance Muir, ANP        Adela Glimpse, FNP  [facility] [address]  [place], [place]. [address] Telephone [phone] Telefax [phone]   Future Appointments  Date Time Provider Department  02/09/2023                6 mo ov  4:00 PM Lucky Cowboy, MD [facility]  05/06/2023                cpe  2:00 PM Raynelle Dick, NP [facility]    History of Present Illness:       This very nice 67 y.o. DBM with HTN, HLD, Pre-Diabetes and Vitamin D Deficiency  presents for 6  month follow up . Patient also has hx/o Gout controlled on his meds.  Patient has GERD controlled on his meds.         Patient is treated for HTN (2004) & BP has been controlled at home. Today's BP is at goal -   138/70 . Patient has had no complaints of any cardiac type chest pain, palpitations, dyspnea Pollyann Kennedy /PND, dizziness, claudication or dependent edema.         Hyperlipidemia is controlled with diet & Pravastatin. Patient denies myalgias or other med SE's. Last Lipids were at goal except elevated Trig's:  Lab Results  Component Value Date   CHOL 155 08/06/2022   HDL 32 (L) 08/06/2022   LDLCALC 81 08/06/2022   TRIG 346 (H) 08/06/2022   CHOLHDL 4.8 08/06/2022     Also, the patient has history of PreDiabetes (A1c 6.4% /2009 & A1c 5.9% /2016) and has had no symptoms of reactive hypoglycemia, diabetic polys, paresthesias or visual blurring.  Last A1c was normal & at goal:  Lab Results  Component Value Date   HGBA1C 5.8 (H) 05/06/2022                                                      Further, the patient also has history of Vitamin D Deficiency ("21" /2008) and supplements vitamin D without any suspected side-effects. Last vitamin D was sl low  (goal 70-100):  Lab Results  Component Value Date   VD25OH 61 05/06/2022       Current Outpatient Medications   Medication Instructions   acyclovir 800 MG tablet TAKE 1 TABLET  DAILY    allopurinol 300 MG tablet TAKE 1 TABLET DAILY    MAALOX/MYLANTA  susp 30 mLs 2 times daily   aspirin EC  81 mg  Daily   atenolol 100 MG tablet Take 1 tablet  daily   SUPER B COMPLEX  1 tablet Daily   cetirizine  10 mg Daily   famotidine  40 mg Every evening   Fish Oil 1,000 mg Daily   Flax Seed Oil  1,000 mg Daily   FLONASE nasal spray 2 sprays, Each Nare, Daily   gabapentin 300 mg 2 times daily PRN   Hctz 25 MG tablet Take  1 tablet  Daily    rosuvastatin 20 mg Daily   Sildenafil  100 MG tablet TAKE 1/2-1  TAB DAILY AS NEEDED  Vitamin C   1,000 mg  Daily   Vitamin D  4,000 Units  Daily     Allergies  Allergen Reactions   Ace Inhibitors     REACTION: cough   Trilipix [Choline Fenofibrate] Other (See Comments)    Heart burn     PMHx:   Past Medical History:  Diagnosis Date   GERD (gastroesophageal reflux disease)    Gout    HSV-1 (herpes simplex virus 1) infection    HSV-2 (herpes simplex virus 2) infection    Hyperlipidemia    Hypertension    Hypogonadism male    Obese    Pneumonia    walking -many years ago   Pre-diabetes      Immunization History  Administered Date(s) Administered   PPD Test 02/13/2014   Pneumococcal Polysaccharide-23 02/23/2002   Tdap 05/15/2009, 05/02/2020     Past Surgical History:  Procedure Laterality Date   BACK SURGERY  2012   on back-hernia   GAS INSERTION Right 08/30/2020   Procedure: INSERTION OF GAS - C3F8;  Surgeon: Rennis Chris, MD;  Location: [facility] OR;  Service: Ophthalmology;  Laterality: Right;   GAS/FLUID EXCHANGE Right 08/30/2020   Procedure: GAS/FLUID EXCHANGE;  Surgeon: Rennis Chris, MD;  Location: [facility] OR;  Service: Ophthalmology;  Laterality: Right;   HERNIA REPAIR     INSERTION OF MESH N/A 08/15/2013   Procedure: INSERTION OF MESH;  Surgeon: Adolph Pollack, MD;  Location: [facility]S;  Service: General;  Laterality: N/A;   LAPAROSCOPIC  INGUINAL HERNIA WITH UMBILICAL HERNIA Right 08/15/2013   Procedure: LAPAROSCOPIC RIGH  INGUINAL HERNIA WITH OPEN UMBILICAL HERNIA;  Surgeon: Adolph Pollack, MD;  Location: [facility];  Service: General;  Laterality: Right;  umbilical repair with mesh   PERFLUORONE INJECTION Right 08/30/2020   Procedure: PERFLUORONE INJECTION;  Surgeon: Rennis Chris, MD;  Location: [facility] OR;  Service: Ophthalmology;  Laterality: Right;   PHOTOCOAGULATION WITH LASER Right 08/30/2020   Procedure: PHOTOCOAGULATION WITH LASER;  Surgeon: Rennis Chris, MD;  Location: [facility] OR;  Service: Ophthalmology;  Laterality: Right;   VITRECTOMY 25 GAUGE WITH SCLERAL BUCKLE Right 08/30/2020   Procedure: SCLERAL BUCKLE WITH 25 GAUGE PARS PLANA VITRECTOMY;  Surgeon: Rennis Chris, MD;  Location: [facility] OR;  Service: Ophthalmology;  Laterality: Right;    FHx:    Reviewed / unchanged  SHx:    Reviewed / unchanged   Systems Review:  Constitutional: Denies fever, chills, wt changes, headaches, insomnia, fatigue, night sweats, change in appetite. Eyes: Denies redness, blurred vision, diplopia, discharge, itchy, watery eyes.  ENT: Denies discharge, congestion, post nasal drip, epistaxis, sore throat, earache, hearing loss, dental pain, tinnitus, vertigo, sinus pain, snoring.  CV: Denies chest pain, palpitations, irregular heartbeat, syncope, dyspnea, diaphoresis, orthopnea, PND, claudication or edema. Respiratory: denies cough, dyspnea, DOE, pleurisy, hoarseness, laryngitis, wheezing.  Gastrointestinal: Denies dysphagia, odynophagia, heartburn, reflux, water brash, abdominal pain or cramps, nausea, vomiting, bloating, diarrhea, constipation, hematemesis, melena, hematochezia  or hemorrhoids. Genitourinary: Denies dysuria, frequency, urgency, nocturia, hesitancy, discharge, hematuria or flank pain. Musculoskeletal: Denies arthralgias, myalgias, stiffness, jt. swelling, pain, limping or strain/sprain.  Skin: Denies pruritus, rash, hives, warts, acne,  eczema or change in skin lesion(s). Neuro: No weakness, tremor, incoordination, spasms, paresthesia or pain. Psychiatric: Denies confusion, memory loss or sensory loss. Endo: Denies change in weight, skin or hair change.  Heme/Lymph: No excessive bleeding, bruising or enlarged lymph nodes.  Physical Exam  BP 138/70   Pulse 60   Temp 98 F (36.7 C)  Resp 16   Ht 5\' 8"  (1.727 m)   Wt 208 lb 9.6 oz (94.6 kg)   SpO2 99%   BMI 31.72 kg/m   Appears  well nourished, well groomed  and in no distress.  Eyes: PERRLA, EOMs, conjunctiva no swelling or erythema. Sinuses: No frontal/maxillary tenderness ENT/Mouth: EAC's clear, TM's nl w/o erythema, bulging. Nares clear w/o erythema, swelling, exudates. Oropharynx clear without erythema or exudates. Oral hygiene is good. Tongue normal, non obstructing. Hearing intact.  Neck: Supple. Thyroid not palpable. Car 2+/2+ without bruits, nodes or JVD. Chest: Respirations nl with BS clear & equal w/o rales, rhonchi, wheezing or stridor.  Cor: Heart sounds normal w/ regular rate and rhythm without sig. murmurs, gallops, clicks or rubs. Peripheral pulses normal and equal  without edema.  Abdomen: Soft & bowel sounds normal. Non-tender w/o guarding, rebound, hernias, masses or organomegaly.  Lymphatics: Unremarkable.  Musculoskeletal: Full ROM all peripheral extremities, joint stability, 5/5 strength and normal gait.  Skin: Warm, dry without exposed rashes, lesions or ecchymosis apparent.  Neuro: Cranial nerves intact, reflexes equal bilaterally. Sensory-motor testing grossly intact. Tendon reflexes grossly intact.  Pysch: Alert & oriented x 3.  Insight and judgement nl & appropriate. No ideations.  Assessment and Plan:                                          1. Essential hypertension  - Continue medication, monitor blood pressure at home.  - Continue DASH diet.  Reminder to go to the ER if any CP,  SOB, nausea, dizziness, severe HA, changes  vision/speech.      - CBC with Differential/Platelet - COMPLETE METABOLIC PANEL WITH GFR - Magnesium - TSH  2. Hyperlipidemia, mixed  - Continue diet/meds, exercise,& lifestyle modifications.  - Continue monitor periodic cholesterol/liver & renal functions     - Lipid panel - TSH  3. Abnormal glucose  - Continue diet, exercise  - Lifestyle modifications.  - Monitor appropriate labs   - Hemoglobin A1c - Insulin, random  4. Vitamin D deficiency  - Continue supplementation.    - VITAMIN D 25 Hydroxy   5. Gastroesophageal reflux disease  - CBC with Differential/Platelet  6. Idiopathic gout  - Uric acid  7. Medication management   - CBC with Differential/Platelet - COMPLETE METABOLIC PANEL WITH GFR - Magnesium - Lipid panel - TSH - Hemoglobin A1c - Insulin, random - VITAMIN D 25 Hydroxy  - Uric acid        Discussed  regular exercise, BP monitoring, weight control to achieve/maintain BMI less than 25 and discussed med and SE's. Recommended labs to assess and monitor clinical status with further disposition pending results of labs.  I discussed the assessment and treatment plan with the patient. The patient was provided an opportunity to ask questions and all were answered. The patient agreed with the plan and demonstrated an understanding of the instructions.  I provided over 30 minutes of exam, counseling, chart review and  complex critical decision making.        The patient was advised to call back or seek an in-person evaluation if the symptoms worsen or if the condition fails to improve as anticipated.   Marinus Maw, MD

## 2023-02-09 ENCOUNTER — Encounter: Payer: Self-pay | Admitting: Internal Medicine

## 2023-02-09 ENCOUNTER — Ambulatory Visit (INDEPENDENT_AMBULATORY_CARE_PROVIDER_SITE_OTHER): Payer: Medicare HMO | Admitting: Internal Medicine

## 2023-02-09 VITALS — BP 138/70 | HR 60 | Temp 98.0°F | Resp 16 | Ht 68.0 in | Wt 208.6 lb

## 2023-02-09 DIAGNOSIS — N529 Male erectile dysfunction, unspecified: Secondary | ICD-10-CM | POA: Diagnosis not present

## 2023-02-09 DIAGNOSIS — M1000 Idiopathic gout, unspecified site: Secondary | ICD-10-CM | POA: Diagnosis not present

## 2023-02-09 DIAGNOSIS — K219 Gastro-esophageal reflux disease without esophagitis: Secondary | ICD-10-CM | POA: Diagnosis not present

## 2023-02-09 DIAGNOSIS — R7309 Other abnormal glucose: Secondary | ICD-10-CM

## 2023-02-09 DIAGNOSIS — E559 Vitamin D deficiency, unspecified: Secondary | ICD-10-CM | POA: Diagnosis not present

## 2023-02-09 DIAGNOSIS — Z79899 Other long term (current) drug therapy: Secondary | ICD-10-CM | POA: Diagnosis not present

## 2023-02-09 DIAGNOSIS — E782 Mixed hyperlipidemia: Secondary | ICD-10-CM | POA: Diagnosis not present

## 2023-02-09 DIAGNOSIS — I10 Essential (primary) hypertension: Secondary | ICD-10-CM

## 2023-02-10 ENCOUNTER — Encounter: Payer: Self-pay | Admitting: Internal Medicine

## 2023-02-10 LAB — CBC WITH DIFFERENTIAL/PLATELET
Absolute Lymphocytes: 2315 {cells}/uL (ref 850–3900)
Absolute Monocytes: 617 {cells}/uL (ref 200–950)
Basophils Absolute: 41 {cells}/uL (ref 0–200)
Basophils Relative: 0.8 %
Eosinophils Absolute: 270 {cells}/uL (ref 15–500)
Eosinophils Relative: 5.3 %
HCT: 45.5 % (ref 38.5–50.0)
Hemoglobin: 15.3 g/dL (ref 13.2–17.1)
MCH: 30.7 pg (ref 27.0–33.0)
MCHC: 33.6 g/dL (ref 32.0–36.0)
MCV: 91.4 fL (ref 80.0–100.0)
MPV: 11.7 fL (ref 7.5–12.5)
Monocytes Relative: 12.1 %
Neutro Abs: 1856 {cells}/uL (ref 1500–7800)
Neutrophils Relative %: 36.4 %
Platelets: 162 10*3/uL (ref 140–400)
RBC: 4.98 10*6/uL (ref 4.20–5.80)
RDW: 13.4 % (ref 11.0–15.0)
Total Lymphocyte: 45.4 %
WBC: 5.1 10*3/uL (ref 3.8–10.8)

## 2023-02-10 LAB — LIPID PANEL
Cholesterol: 169 mg/dL (ref <200)
HDL: 36 mg/dL — ABNORMAL LOW (ref >=40)
LDL Cholesterol (Calc): 104 mg/dL — ABNORMAL HIGH
Non-HDL Cholesterol (Calc): 133 mg/dL — ABNORMAL HIGH (ref <130)
Total CHOL/HDL Ratio: 4.7 (calc) (ref <5.0)
Triglycerides: 173 mg/dL — ABNORMAL HIGH (ref <150)

## 2023-02-10 LAB — COMPLETE METABOLIC PANEL WITH GFR
AG Ratio: 1.7 (calc) (ref 1.0–2.5)
ALT: 14 U/L (ref 9–46)
AST: 20 U/L (ref 10–35)
Albumin: 4.5 g/dL (ref 3.6–5.1)
Alkaline phosphatase (APISO): 70 U/L (ref 35–144)
BUN: 14 mg/dL (ref 7–25)
CO2: 27 mmol/L (ref 20–32)
Calcium: 9.5 mg/dL (ref 8.6–10.3)
Chloride: 102 mmol/L (ref 98–110)
Creat: 0.95 mg/dL (ref 0.70–1.35)
Globulin: 2.6 g/dL (ref 1.9–3.7)
Glucose, Bld: 98 mg/dL (ref 65–99)
Potassium: 3.3 mmol/L — ABNORMAL LOW (ref 3.5–5.3)
Sodium: 138 mmol/L (ref 135–146)
Total Bilirubin: 0.7 mg/dL (ref 0.2–1.2)
Total Protein: 7.1 g/dL (ref 6.1–8.1)
eGFR: 88 mL/min/{1.73_m2} (ref >=60)

## 2023-02-10 LAB — TSH: TSH: 1.62 m[IU]/L (ref 0.40–4.50)

## 2023-02-10 LAB — VITAMIN D 25 HYDROXY (VIT D DEFICIENCY, FRACTURES): Vit D, 25-Hydroxy: 52 ng/mL (ref 30–100)

## 2023-02-10 LAB — HEMOGLOBIN A1C
Hgb A1c MFr Bld: 5.9 %{Hb} — ABNORMAL HIGH (ref <5.7)
Mean Plasma Glucose: 123 mg/dL
eAG (mmol/L): 6.8 mmol/L

## 2023-02-10 LAB — MAGNESIUM: Magnesium: 1.9 mg/dL (ref 1.5–2.5)

## 2023-02-10 LAB — URIC ACID: Uric Acid, Serum: 6.8 mg/dL (ref 4.0–8.0)

## 2023-02-10 LAB — INSULIN, RANDOM: Insulin: 15.2 u[IU]/mL

## 2023-02-10 NOTE — Progress Notes (Signed)
[][][][][][][][][][][][][][][][][][][][][][][][][][][][][][][][][][][][][][][][][]][][][][][][][][][][][][][][][][][][][][][][][[][][][][] [][][][][][][][][][][][][][][][][][][][][][][][][][][][][][][][][][][][][][][][][]][][][][][][][][][][][][][][][][][][][][][][][[][][][][] -  Test results slightly outside the reference range are not unusual. If there is anything important, I will review this with you,  otherwise it is considered normal test values.  If you have further questions,  please do not hesitate to contact me at the office or via My Chart.  [] [] [] [] [] [] [] [] [] [] [] [] [] [] [] [] [] [] [] [] [] [] [] [] [] [] [] [] [] [] [] [] [] [] [] [] [] [] [] [] [] ][] [] [] [] [] [] [] [] [] [] [] [] [] [] [] [] [] [] [] [] [] [] [[] [] [] [] []  [] [] [] [] [] [] [] [] [] [] [] [] [] [] [] [] [] [] [] [] [] [] [] [] [] [] [] [] [] [] [] [] [] [] [] [] [] [] [] [] [] ][] [] [] [] [] [] [] [] [] [] [] [] [] [] [] [] [] [] [] [] [] [] [[] [] [] [] []   -  Potassium slightly low - recommend use "Salt substitute " for extra potassium  [] [] [] [] [] [] [] [] [] [] [] [] [] [] [] [] [] [] [] [] [] [] [] [] [] [] [] [] [] [] [] [] [] [] [] [] [] [] [] [] [] ][] [] [] [] [] [] [] [] [] [] [] [] [] [] [] [] [] [] [] [] [] [] [[] [] [] [] []   -  Chol = 169 -  Excellent   - Very low risk for Heart Attack  / Stroke  [] [] [] [] [] [] [] [] [] [] [] [] [] [] [] [] [] [] [] [] [] [] [] [] [] [] [] [] [] [] [] [] [] [] [] [] [] [] [] [] [] ][] [] [] [] [] [] [] [] [] [] [] [] [] [] [] [] [] [] [] [] [] [] [[] [] [] [] []   -  A1c = 5.9%  Blood sugar and A1c are elevated in the borderline and                                                              early or pre-diabetes range which has the same   300% increased risk for heart attack, stroke, cancer and                                               alzheimer- type vascular dementia as full blown diabetes.   But the good news is that diet, exercise with                                                       weight loss can cure the early diabetes at this point.  Recommend    - Avoid Sweets, Candy & White Stuff   - White Rice, White Potatoes, White Flour  - Breads &   Pasta  [] [] [] [] [] [] [] [] [] [] [] [] [] [] [] [] [] [] [] [] [] [] [] [] [] [] [] [] [] [] [] [] [] [] [] [] [] [] [] [] [] ][] [] [] [] [] [] [] [] [] [] [] [] [] [] [] [] [] [] [] [] [] [] [[] [] [] [] []   -  Vitamin D - OK    [] [] [] [] [] [] [] [] [] [] [] [] [] [] [] [] [] [] [] [] [] [] [] [] [] [] [] [] [] [] [] [] [] [] [] [] [] [] [] [] [] ][] [] [] [] [] [] [] [] [] [] [] [] [] [] [] [] [] [] [] [] [] [] [[] [] [] [] []   -  Uric acid / Gout test OK   - continue Allopurinol same   [] [] [] [] [] [] [] [] [] [] [] [] [] [] [] [] [] [] [] [] [] [] [] [] [] [] [] [] [] [] [] [] [] [] [] [] [] [] [] [] [] ][] [] [] [] [] [] [] [] [] [] [] [] [] [] [] [] [] [] [] [] [] [] [[] [] [] [] []   -  All Else - CBC - Kidneys - Electrolytes - Liver - Magnesium & Thyroid    - all  Normal / OK  [] [] [] [] [] [] [] [] [] [] [] [] [] [] [] [] [] [] [] [] [] [] [] [] [] [] [] [] [] [] [] [] [] [] [] [] [] [] [] [] [] ][] [] [] [] [] [] [] [] [] [] [] [] [] [] [] [] [] [] [] [] [] [] [[] [] [] [] []

## 2023-02-12 ENCOUNTER — Emergency Department (HOSPITAL_BASED_OUTPATIENT_CLINIC_OR_DEPARTMENT_OTHER): Payer: Medicare HMO

## 2023-02-12 ENCOUNTER — Other Ambulatory Visit: Payer: Self-pay

## 2023-02-12 ENCOUNTER — Emergency Department (HOSPITAL_BASED_OUTPATIENT_CLINIC_OR_DEPARTMENT_OTHER): Payer: Medicare HMO | Admitting: Radiology

## 2023-02-12 ENCOUNTER — Emergency Department (HOSPITAL_BASED_OUTPATIENT_CLINIC_OR_DEPARTMENT_OTHER)
Admission: EM | Admit: 2023-02-12 | Discharge: 2023-02-12 | Disposition: A | Discharge status: Home / Self Care | Payer: Medicare HMO | Attending: Emergency Medicine | Admitting: Emergency Medicine

## 2023-02-12 ENCOUNTER — Encounter (HOSPITAL_BASED_OUTPATIENT_CLINIC_OR_DEPARTMENT_OTHER): Payer: Self-pay

## 2023-02-12 DIAGNOSIS — M47816 Spondylosis without myelopathy or radiculopathy, lumbar region: Secondary | ICD-10-CM | POA: Diagnosis not present

## 2023-02-12 DIAGNOSIS — Y92410 Unspecified street and highway as the place of occurrence of the external cause: Secondary | ICD-10-CM | POA: Insufficient documentation

## 2023-02-12 DIAGNOSIS — M545 Low back pain, unspecified: Secondary | ICD-10-CM | POA: Insufficient documentation

## 2023-02-12 DIAGNOSIS — R519 Headache, unspecified: Secondary | ICD-10-CM | POA: Diagnosis not present

## 2023-02-12 DIAGNOSIS — S0083XA Contusion of other part of head, initial encounter: Secondary | ICD-10-CM | POA: Diagnosis not present

## 2023-02-12 DIAGNOSIS — G8911 Acute pain due to trauma: Secondary | ICD-10-CM | POA: Insufficient documentation

## 2023-02-12 DIAGNOSIS — Z7982 Long term (current) use of aspirin: Secondary | ICD-10-CM | POA: Diagnosis not present

## 2023-02-12 NOTE — ED Notes (Signed)
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
## 2023-03-04 ENCOUNTER — Encounter: Payer: Self-pay | Admitting: Nurse Practitioner

## 2023-03-04 ENCOUNTER — Ambulatory Visit (INDEPENDENT_AMBULATORY_CARE_PROVIDER_SITE_OTHER): Payer: Medicare HMO | Admitting: Nurse Practitioner

## 2023-03-04 VITALS — BP 122/78 | HR 57 | Temp 98.1°F | Ht 68.0 in | Wt 216.8 lb

## 2023-03-04 DIAGNOSIS — Z09 Encounter for follow-up examination after completed treatment for conditions other than malignant neoplasm: Secondary | ICD-10-CM

## 2023-03-04 DIAGNOSIS — R0602 Shortness of breath: Secondary | ICD-10-CM

## 2023-03-04 NOTE — Patient Instructions (Signed)
 Shortness of Breath, Adult Shortness of breath means you have trouble breathing. Shortness of breath could be a sign of a medical problem. Follow these instructions at home:  Pollution Do not smoke or use any products that contain nicotine or tobacco. If you need help quitting, ask your doctor. Avoid things that can make it harder to breathe, such as: Smoke of all kinds. This includes smoke from campfires or forest fires. Do not smoke or allow others to smoke in your home. Mold. Dust. Air pollution. Chemical smells. Things that can give you an allergic reaction (allergens) if you have allergies. Keep your living space clean. Use products that help remove mold and dust. General instructions Watch for any changes in your symptoms. Take over-the-counter and prescription medicines only as told by your doctor. This includes oxygen therapy and inhaled medicines. Rest as needed. Return to your normal activities when your doctor says that it is safe. Keep all follow-up visits. Contact a doctor if: Your condition does not get better as soon as expected. You have a hard time doing your normal activities, even after you rest. You have new symptoms. You cannot walk up stairs. You cannot exercise the way you normally do. Get help right away if: Your shortness of breath gets worse. You have trouble breathing when you are resting. You feel light-headed or you faint. You have a cough that is not helped by medicines. You cough up blood. You have pain with breathing. You have pain in your chest, arms, shoulders, or belly (abdomen). You have a fever. These symptoms may be an emergency. Get help right away. Call 911. Do not wait to see if the symptoms will go away. Do not drive yourself to the hospital. Summary Shortness of breath is when you have trouble breathing enough air. It can be a sign of a medical problem. Avoid things that make it hard for you to breathe, such as smoking, pollution,  mold, and dust. Watch for any changes in your symptoms. Contact your doctor if you do not get better or you get worse. This information is not intended to replace advice given to you by your health care provider. Make sure you discuss any questions you have with your health care provider. Document Revised: 09/28/2020 Document Reviewed: 09/28/2020 Elsevier Patient Education  2024 [employer].

## 2023-03-05 ENCOUNTER — Ambulatory Visit
Admission: RE | Admit: 2023-03-05 | Discharge: 2023-03-05 | Disposition: A | Discharge status: Home / Self Care | Payer: Medicare HMO | Source: Ambulatory Visit | Attending: Nurse Practitioner | Admitting: Nurse Practitioner

## 2023-03-05 DIAGNOSIS — R0602 Shortness of breath: Secondary | ICD-10-CM | POA: Diagnosis not present

## 2023-03-09 ENCOUNTER — Telehealth: Payer: Self-pay | Admitting: Nurse Practitioner

## 2023-03-09 NOTE — Telephone Encounter (Signed)
 Patient aware of results.

## 2023-03-09 NOTE — Telephone Encounter (Signed)
 Called for  x-ray results, if someone could call him back and give those to him please.

## 2023-03-11 ENCOUNTER — Encounter: Payer: Self-pay | Admitting: Internal Medicine

## 2023-03-15 ENCOUNTER — Other Ambulatory Visit: Payer: Self-pay | Admitting: Internal Medicine

## 2023-03-15 DIAGNOSIS — I10 Essential (primary) hypertension: Secondary | ICD-10-CM

## 2023-03-23 ENCOUNTER — Other Ambulatory Visit: Payer: Self-pay | Admitting: Nurse Practitioner

## 2023-03-23 DIAGNOSIS — N529 Male erectile dysfunction, unspecified: Secondary | ICD-10-CM

## 2023-03-29 ENCOUNTER — Other Ambulatory Visit: Payer: Self-pay | Admitting: Nurse Practitioner

## 2023-03-29 DIAGNOSIS — I10 Essential (primary) hypertension: Secondary | ICD-10-CM

## 2023-04-14 ENCOUNTER — Other Ambulatory Visit: Payer: Self-pay

## 2023-04-14 DIAGNOSIS — N529 Male erectile dysfunction, unspecified: Secondary | ICD-10-CM

## 2023-04-14 MED ORDER — SILDENAFIL CITRATE 100 MG PO TABS: ORAL_TABLET | ORAL | 0 refills | Status: AC

## 2023-05-06 ENCOUNTER — Encounter: Payer: Medicare HMO | Admitting: Nurse Practitioner

## 2023-05-12 ENCOUNTER — Encounter: Payer: Medicare HMO | Admitting: Nurse Practitioner

## 2023-05-24 ENCOUNTER — Other Ambulatory Visit: Payer: Self-pay | Admitting: Family

## 2023-05-24 DIAGNOSIS — N529 Male erectile dysfunction, unspecified: Secondary | ICD-10-CM

## 2023-06-25 DIAGNOSIS — N529 Male erectile dysfunction, unspecified: Secondary | ICD-10-CM | POA: Diagnosis not present

## 2023-06-25 DIAGNOSIS — N182 Chronic kidney disease, stage 2 (mild): Secondary | ICD-10-CM | POA: Diagnosis not present

## 2023-06-25 DIAGNOSIS — Z6831 Body mass index (BMI) 31.0-31.9, adult: Secondary | ICD-10-CM | POA: Diagnosis not present

## 2023-06-25 DIAGNOSIS — E785 Hyperlipidemia, unspecified: Secondary | ICD-10-CM | POA: Diagnosis not present

## 2023-06-25 DIAGNOSIS — E669 Obesity, unspecified: Secondary | ICD-10-CM | POA: Diagnosis not present

## 2023-06-25 DIAGNOSIS — M1990 Unspecified osteoarthritis, unspecified site: Secondary | ICD-10-CM | POA: Diagnosis not present

## 2023-06-25 DIAGNOSIS — F1021 Alcohol dependence, in remission: Secondary | ICD-10-CM | POA: Diagnosis not present

## 2023-06-25 DIAGNOSIS — I129 Hypertensive chronic kidney disease with stage 1 through stage 4 chronic kidney disease, or unspecified chronic kidney disease: Secondary | ICD-10-CM | POA: Diagnosis not present

## 2023-06-25 DIAGNOSIS — Z87891 Personal history of nicotine dependence: Secondary | ICD-10-CM | POA: Diagnosis not present

## 2023-07-29 DIAGNOSIS — E782 Mixed hyperlipidemia: Secondary | ICD-10-CM | POA: Diagnosis not present

## 2023-07-29 DIAGNOSIS — R7301 Impaired fasting glucose: Secondary | ICD-10-CM | POA: Diagnosis not present

## 2023-07-29 DIAGNOSIS — Z8739 Personal history of other diseases of the musculoskeletal system and connective tissue: Secondary | ICD-10-CM | POA: Diagnosis not present

## 2023-07-29 DIAGNOSIS — Z125 Encounter for screening for malignant neoplasm of prostate: Secondary | ICD-10-CM | POA: Diagnosis not present

## 2023-08-04 DIAGNOSIS — R7301 Impaired fasting glucose: Secondary | ICD-10-CM | POA: Diagnosis not present

## 2023-08-04 DIAGNOSIS — E782 Mixed hyperlipidemia: Secondary | ICD-10-CM | POA: Diagnosis not present

## 2023-08-04 DIAGNOSIS — N529 Male erectile dysfunction, unspecified: Secondary | ICD-10-CM | POA: Diagnosis not present

## 2023-08-04 DIAGNOSIS — Z8739 Personal history of other diseases of the musculoskeletal system and connective tissue: Secondary | ICD-10-CM | POA: Diagnosis not present

## 2023-08-04 DIAGNOSIS — I10 Essential (primary) hypertension: Secondary | ICD-10-CM | POA: Diagnosis not present

## 2023-08-16 ENCOUNTER — Ambulatory Visit: Payer: Medicare HMO | Admitting: Nurse Practitioner

## 2023-10-07 DIAGNOSIS — Z961 Presence of intraocular lens: Secondary | ICD-10-CM | POA: Diagnosis not present

## 2023-10-07 DIAGNOSIS — H0288B Meibomian gland dysfunction left eye, upper and lower eyelids: Secondary | ICD-10-CM | POA: Diagnosis not present

## 2023-10-07 DIAGNOSIS — H04123 Dry eye syndrome of bilateral lacrimal glands: Secondary | ICD-10-CM | POA: Diagnosis not present

## 2023-10-07 DIAGNOSIS — H2512 Age-related nuclear cataract, left eye: Secondary | ICD-10-CM | POA: Diagnosis not present

## 2023-10-07 DIAGNOSIS — H33021 Retinal detachment with multiple breaks, right eye: Secondary | ICD-10-CM | POA: Diagnosis not present

## 2023-10-07 DIAGNOSIS — E119 Type 2 diabetes mellitus without complications: Secondary | ICD-10-CM | POA: Diagnosis not present

## 2023-10-07 DIAGNOSIS — H35033 Hypertensive retinopathy, bilateral: Secondary | ICD-10-CM | POA: Diagnosis not present

## 2023-10-07 DIAGNOSIS — H0288A Meibomian gland dysfunction right eye, upper and lower eyelids: Secondary | ICD-10-CM | POA: Diagnosis not present

## 2023-10-10 ENCOUNTER — Other Ambulatory Visit: Payer: Self-pay | Admitting: Nurse Practitioner

## 2023-10-10 DIAGNOSIS — M1A00X0 Idiopathic chronic gout, unspecified site, without tophus (tophi): Secondary | ICD-10-CM

## 2023-11-18 ENCOUNTER — Ambulatory Visit: Payer: Medicare HMO | Admitting: Internal Medicine

## 2024-02-01 DIAGNOSIS — R7301 Impaired fasting glucose: Secondary | ICD-10-CM | POA: Diagnosis not present

## 2024-02-01 DIAGNOSIS — I10 Essential (primary) hypertension: Secondary | ICD-10-CM | POA: Diagnosis not present

## 2024-02-01 DIAGNOSIS — Z8739 Personal history of other diseases of the musculoskeletal system and connective tissue: Secondary | ICD-10-CM | POA: Diagnosis not present
